# Patient Record
Sex: Female | Born: 1995
Health system: Southern US, Community
[De-identification: ages and names within clinical notes are randomized; demographics above are authoritative.]

## PROBLEM LIST (undated history)

## (undated) DIAGNOSIS — D219 Benign neoplasm of connective and other soft tissue, unspecified: Secondary | ICD-10-CM

## (undated) DIAGNOSIS — D649 Anemia, unspecified: Secondary | ICD-10-CM

## (undated) DIAGNOSIS — R519 Headache, unspecified: Secondary | ICD-10-CM

## (undated) DIAGNOSIS — Z789 Other specified health status: Secondary | ICD-10-CM

## (undated) DIAGNOSIS — Z22330 Carrier of Group B streptococcus: Secondary | ICD-10-CM

## (undated) DIAGNOSIS — D242 Benign neoplasm of left breast: Secondary | ICD-10-CM

## (undated) DIAGNOSIS — Z8619 Personal history of other infectious and parasitic diseases: Secondary | ICD-10-CM

## (undated) DIAGNOSIS — E079 Disorder of thyroid, unspecified: Secondary | ICD-10-CM

## (undated) DIAGNOSIS — R51 Headache: Secondary | ICD-10-CM

## (undated) HISTORY — DX: Benign neoplasm of connective and other soft tissue, unspecified: D21.9

## (undated) HISTORY — DX: Benign neoplasm of left breast: D24.2

## (undated) HISTORY — DX: Headache: R51

## (undated) HISTORY — DX: Personal history of other infectious and parasitic diseases: Z86.19

## (undated) HISTORY — PX: NO PAST SURGERIES: SHX2092

## (undated) HISTORY — DX: Headache, unspecified: R51.9

## (undated) HISTORY — DX: Anemia, unspecified: D64.9

---

## 2014-02-02 ENCOUNTER — Encounter (HOSPITAL_COMMUNITY): Payer: Self-pay | Admitting: Emergency Medicine

## 2014-02-02 ENCOUNTER — Emergency Department (HOSPITAL_COMMUNITY)
Admission: EM | Admit: 2014-02-02 | Discharge: 2014-02-02 | Disposition: A | Discharge status: Home / Self Care | Payer: No Typology Code available for payment source | Attending: Emergency Medicine | Admitting: Emergency Medicine

## 2014-02-02 ENCOUNTER — Emergency Department (HOSPITAL_COMMUNITY): Payer: No Typology Code available for payment source

## 2014-02-02 DIAGNOSIS — S8002XA Contusion of left knee, initial encounter: Secondary | ICD-10-CM

## 2014-02-02 DIAGNOSIS — Y9389 Activity, other specified: Secondary | ICD-10-CM | POA: Insufficient documentation

## 2014-02-02 DIAGNOSIS — S8000XA Contusion of unspecified knee, initial encounter: Secondary | ICD-10-CM | POA: Insufficient documentation

## 2014-02-02 DIAGNOSIS — Y92410 Unspecified street and highway as the place of occurrence of the external cause: Secondary | ICD-10-CM | POA: Insufficient documentation

## 2014-02-02 MED ORDER — IBUPROFEN 400 MG PO TABS
400.0000 mg | ORAL_TABLET | Freq: Once | ORAL | Status: AC
  Administered 2014-02-02: 400 mg via ORAL
  Filled 2014-02-02: qty 1
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
## 2014-02-02 NOTE — Discharge Instructions (Signed)
Rest, Ice intermittently (in the first 24-48 hours), Gentle compression with an Ace wrap, and elevate (Limb above the level of the heart)   Take up to 800mg  of ibuprofen (that is usually 4 over the counter pills)  3 times a day for 5 days. Take with food.  Do not hesitate to return to the Emergency Department for any new, worsening or concerning symptoms.   If you do not have a primary care doctor you can establish one at the   [facility]: [place] [id number] [phone]  After you establish care. Let them know you were seen in the emergency room. They must obtain records for further management.    Contusion A contusion is a deep bruise. Contusions are the result of an injury that caused bleeding under the skin. The contusion may turn blue, purple, or yellow. Minor injuries will give you a painless contusion, but more severe contusions may stay painful and swollen for a few weeks.  CAUSES  A contusion is usually caused by a blow, trauma, or direct force to an area of the body. SYMPTOMS   Swelling and redness of the injured area.  Bruising of the injured area.  Tenderness and soreness of the injured area.  Pain. DIAGNOSIS  The diagnosis can be made by taking a history and physical exam. An X-ray, CT scan, or MRI may be needed to determine if there were any associated injuries, such as fractures. TREATMENT  Specific treatment will depend on what area of the body was injured. In general, the best treatment for a contusion is resting, icing, elevating, and applying cold compresses to the injured area. Over-the-counter medicines may also be recommended for pain control. Ask your caregiver what the best treatment is for your contusion. HOME CARE INSTRUCTIONS   Put ice on the injured area.  Put ice in a plastic bag.  Place a towel between your skin and the bag.  Leave the ice on for 15-20 minutes, 3-4 times a day, or as directed by your health care  provider.  Only take over-the-counter or prescription medicines for pain, discomfort, or fever as directed by your caregiver. Your caregiver may recommend avoiding anti-inflammatory medicines (aspirin, ibuprofen, and naproxen) for 48 hours because these medicines may increase bruising.  Rest the injured area.  If possible, elevate the injured area to reduce swelling. SEEK IMMEDIATE MEDICAL CARE IF:   You have increased bruising or swelling.  You have pain that is getting worse.  Your swelling or pain is not relieved with medicines. MAKE SURE YOU:   Understand these instructions.  Will watch your condition.  Will get help right away if you are not doing well or get worse. Document Released: 05/02/2005 Document Revised: 07/28/2013 Document Reviewed: 05/28/2011 [facility] Patient Information 2015 [place], [place]. This information is not intended to replace advice given to you by your health care provider. Make sure you discuss any questions you have with your health care provider.

## 2014-02-04 NOTE — ED Provider Notes (Signed)
Medical screening examination/treatment/procedure(s) were performed by non-physician practitioner and as supervising physician I was immediately available for consultation/collaboration.   EKG Interpretation None         Tanna Furry, MD 02/04/14 1510

## 2014-02-13 ENCOUNTER — Emergency Department (HOSPITAL_COMMUNITY)
Admission: EM | Admit: 2014-02-13 | Discharge: 2014-02-13 | Disposition: A | Discharge status: Home / Self Care | Payer: No Typology Code available for payment source | Attending: Emergency Medicine | Admitting: Emergency Medicine

## 2014-02-13 ENCOUNTER — Encounter (HOSPITAL_COMMUNITY): Payer: Self-pay | Admitting: Emergency Medicine

## 2014-02-13 DIAGNOSIS — N938 Other specified abnormal uterine and vaginal bleeding: Secondary | ICD-10-CM | POA: Insufficient documentation

## 2014-02-13 DIAGNOSIS — Z791 Long term (current) use of non-steroidal anti-inflammatories (NSAID): Secondary | ICD-10-CM | POA: Insufficient documentation

## 2014-02-13 DIAGNOSIS — N949 Unspecified condition associated with female genital organs and menstrual cycle: Secondary | ICD-10-CM | POA: Insufficient documentation

## 2014-02-13 DIAGNOSIS — N939 Abnormal uterine and vaginal bleeding, unspecified: Secondary | ICD-10-CM

## 2014-02-13 DIAGNOSIS — Z3202 Encounter for pregnancy test, result negative: Secondary | ICD-10-CM | POA: Insufficient documentation

## 2014-02-13 LAB — WET PREP, GENITAL
CLUE CELLS WET PREP: NONE SEEN
TRICH WET PREP: NONE SEEN
Yeast Wet Prep HPF POC: NONE SEEN

## 2014-02-13 LAB — I-STAT CHEM 8, ED
BUN: 17 mg/dL (ref 6–23)
CALCIUM ION: 1.24 mmol/L — AB (ref 1.12–1.23)
Chloride: 105 mEq/L (ref 96–112)
Creatinine, Ser: 1 mg/dL (ref 0.50–1.10)
GLUCOSE: 93 mg/dL (ref 70–99)
HEMATOCRIT: 41 % (ref 36.0–46.0)
Hemoglobin: 13.9 g/dL (ref 12.0–15.0)
Potassium: 4.2 mEq/L (ref 3.7–5.3)
Sodium: 140 mEq/L (ref 137–147)
TCO2: 24 mmol/L (ref 0–100)

## 2014-02-13 LAB — POC URINE PREG, ED: Preg Test, Ur: NEGATIVE

## 2014-02-13 NOTE — Discharge Instructions (Signed)
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
## 2014-02-13 NOTE — ED Notes (Signed)
Pt is having vaginal bleeding and it is not her menstrual cycle because her last menstrual was a weak ago.  Pt states that she had some abdominal pain and lower back pain and some dizziness.

## 2014-02-14 NOTE — ED Provider Notes (Signed)
Medical screening examination/treatment/procedure(s) were performed by non-physician practitioner and as supervising physician I was immediately available for consultation/collaboration.   EKG Interpretation None       Veryl Speak, MD 02/14/14 1105

## 2014-02-15 LAB — GC/CHLAMYDIA PROBE AMP
CT PROBE, AMP APTIMA: NEGATIVE
GC Probe RNA: NEGATIVE

## 2014-10-25 ENCOUNTER — Emergency Department (HOSPITAL_COMMUNITY)
Admission: EM | Admit: 2014-10-25 | Discharge: 2014-10-25 | Disposition: A | Discharge status: Home / Self Care | Payer: Self-pay | Attending: Emergency Medicine | Admitting: Emergency Medicine

## 2014-10-25 ENCOUNTER — Encounter (HOSPITAL_COMMUNITY): Payer: Self-pay | Admitting: *Deleted

## 2014-10-25 DIAGNOSIS — Z8619 Personal history of other infectious and parasitic diseases: Secondary | ICD-10-CM | POA: Insufficient documentation

## 2014-10-25 DIAGNOSIS — Z3202 Encounter for pregnancy test, result negative: Secondary | ICD-10-CM | POA: Insufficient documentation

## 2014-10-25 DIAGNOSIS — Z791 Long term (current) use of non-steroidal anti-inflammatories (NSAID): Secondary | ICD-10-CM | POA: Insufficient documentation

## 2014-10-25 DIAGNOSIS — Z87891 Personal history of nicotine dependence: Secondary | ICD-10-CM | POA: Insufficient documentation

## 2014-10-25 DIAGNOSIS — R112 Nausea with vomiting, unspecified: Secondary | ICD-10-CM | POA: Insufficient documentation

## 2014-10-25 HISTORY — DX: Carrier of group B Streptococcus: Z22.330

## 2014-10-25 LAB — COMPREHENSIVE METABOLIC PANEL
ALBUMIN: 4.2 g/dL (ref 3.5–5.2)
ALK PHOS: 52 U/L (ref 39–117)
ALT: 27 U/L (ref 0–35)
AST: 22 U/L (ref 0–37)
Anion gap: 9 (ref 5–15)
BILIRUBIN TOTAL: 0.7 mg/dL (ref 0.3–1.2)
BUN: 13 mg/dL (ref 6–23)
CALCIUM: 9.4 mg/dL (ref 8.4–10.5)
CO2: 24 mmol/L (ref 19–32)
CREATININE: 0.83 mg/dL (ref 0.50–1.10)
Chloride: 106 mmol/L (ref 96–112)
GFR calc Af Amer: >90 mL/min (ref >90)
GFR calc non Af Amer: >90 mL/min (ref >90)
GLUCOSE: 109 mg/dL — AB (ref 70–99)
Potassium: 4.2 mmol/L (ref 3.5–5.1)
Sodium: 139 mmol/L (ref 135–145)
TOTAL PROTEIN: 6.7 g/dL (ref 6.0–8.3)

## 2014-10-25 LAB — URINE MICROSCOPIC-ADD ON

## 2014-10-25 LAB — CBC WITH DIFFERENTIAL/PLATELET
Basophils Absolute: 0 10*3/uL (ref 0.0–0.1)
Basophils Relative: 0 % (ref 0–1)
EOS ABS: 0 10*3/uL (ref 0.0–0.7)
Eosinophils Relative: 0 % (ref 0–5)
HCT: 41.2 % (ref 36.0–46.0)
HEMOGLOBIN: 13.7 g/dL (ref 12.0–15.0)
LYMPHS ABS: 0.9 10*3/uL (ref 0.7–4.0)
Lymphocytes Relative: 10 % — ABNORMAL LOW (ref 12–46)
MCH: 29.3 pg (ref 26.0–34.0)
MCHC: 33.3 g/dL (ref 30.0–36.0)
MCV: 88.2 fL (ref 78.0–100.0)
MONO ABS: 0.6 10*3/uL (ref 0.1–1.0)
MONOS PCT: 6 % (ref 3–12)
Neutro Abs: 8.1 10*3/uL — ABNORMAL HIGH (ref 1.7–7.7)
Neutrophils Relative %: 84 % — ABNORMAL HIGH (ref 43–77)
Platelets: 224 10*3/uL (ref 150–400)
RBC: 4.67 MIL/uL (ref 3.87–5.11)
RDW: 13.2 % (ref 11.5–15.5)
WBC: 9.7 10*3/uL (ref 4.0–10.5)

## 2014-10-25 LAB — URINALYSIS, ROUTINE W REFLEX MICROSCOPIC
BILIRUBIN URINE: NEGATIVE
GLUCOSE, UA: NEGATIVE mg/dL
HGB URINE DIPSTICK: NEGATIVE
KETONES UR: NEGATIVE mg/dL
Nitrite: NEGATIVE
PH: 7 (ref 5.0–8.0)
PROTEIN: NEGATIVE mg/dL
Specific Gravity, Urine: 1.024 (ref 1.005–1.030)
Urobilinogen, UA: 0.2 mg/dL (ref 0.0–1.0)

## 2014-10-25 LAB — POC URINE PREG, ED: PREG TEST UR: NEGATIVE

## 2014-10-25 MED ORDER — ONDANSETRON 4 MG PO TBDP
8.0000 mg | ORAL_TABLET | Freq: Once | ORAL | Status: AC
  Administered 2014-10-25: 8 mg via ORAL
  Filled 2014-10-25: qty 2

## 2014-10-25 MED ORDER — KETOROLAC TROMETHAMINE 30 MG/ML IJ SOLN
30.0000 mg | Freq: Once | INTRAMUSCULAR | Status: AC
  Administered 2014-10-25: 30 mg via INTRAVENOUS
  Filled 2014-10-25: qty 1

## 2014-10-25 MED ORDER — SODIUM CHLORIDE 0.9 % IV BOLUS (SEPSIS)
1000.0000 mL | Freq: Once | INTRAVENOUS | Status: AC
  Administered 2014-10-25: 1000 mL via INTRAVENOUS

## 2014-10-25 MED ORDER — DICYCLOMINE HCL 10 MG/ML IM SOLN
20.0000 mg | Freq: Once | INTRAMUSCULAR | Status: DC
  Filled 2014-10-25: qty 2

## 2014-10-25 NOTE — ED Notes (Signed)
Pt. Started vomiting last night around 10 pm. Pt is unsure of how much she has vomited. Pt. States vomit is now green

## 2014-10-25 NOTE — Discharge Instructions (Signed)
Nausea and Vomiting Nausea means you feel sick to your stomach. Throwing up (vomiting) is a reflex where stomach contents come out of your mouth. HOME CARE   Take medicine as told by your doctor.  Do not force yourself to eat. However, you do need to drink fluids.  If you feel like eating, eat a normal diet as told by your doctor.  Eat rice, wheat, potatoes, bread, lean meats, yogurt, fruits, and vegetables.  Avoid high-fat foods.  Drink enough fluids to keep your pee (urine) clear or pale yellow.  Ask your doctor how to replace body fluid losses (rehydrate). Signs of body fluid loss (dehydration) include:  Feeling very thirsty.  Dry lips and mouth.  Feeling dizzy.  Dark pee.  Peeing less than normal.  Feeling confused.  Fast breathing or heart rate. GET HELP RIGHT AWAY IF:   You have blood in your throw up.  You have black or bloody poop (stool).  You have a bad headache or stiff neck.  You feel confused.  You have bad belly (abdominal) pain.  You have chest pain or trouble breathing.  You do not pee at least once every 8 hours.  You have cold, clammy skin.  You keep throwing up after 24 to 48 hours.  You have a fever. MAKE SURE YOU:   Understand these instructions.  Will watch your condition.  Will get help right away if you are not doing well or get worse. Document Released: 01/09/2008 Document Revised: 10/15/2011 Document Reviewed: 12/22/2010 [facility] Patient Information 2015 [place], [place]. This information is not intended to replace advice given to you by your health care provider. Make sure you discuss any questions you have with your health care provider.  Food Choices to Help Relieve Diarrhea When you have diarrhea, the foods you eat and your eating habits are very important. Choosing the right foods and drinks can help relieve diarrhea. Also, because diarrhea can last up to 7 days, you need to replace lost fluids and electrolytes (such as  sodium, potassium, and chloride) in order to help prevent dehydration.  WHAT GENERAL GUIDELINES DO I NEED TO FOLLOW?  Slowly drink 1 cup (8 oz) of fluid for each episode of diarrhea. If you are getting enough fluid, your urine will be clear or pale yellow.  Eat starchy foods. Some good choices include white rice, white toast, pasta, low-fiber cereal, baked potatoes (without the skin), saltine crackers, and bagels.  Avoid large servings of any cooked vegetables.  Limit fruit to two servings per day. A serving is  cup or 1 small piece.  Choose foods with less than 2 g of fiber per serving.  Limit fats to less than 8 tsp (38 g) per day.  Avoid fried foods.  Eat foods that have probiotics in them. Probiotics can be found in certain dairy products.  Avoid foods and beverages that may increase the speed at which food moves through the stomach and intestines (gastrointestinal tract). Things to avoid include:  High-fiber foods, such as dried fruit, raw fruits and vegetables, nuts, seeds, and whole grain foods.  Spicy foods and high-fat foods.  Foods and beverages sweetened with high-fructose corn syrup, honey, or sugar alcohols such as xylitol, sorbitol, and mannitol. WHAT FOODS ARE RECOMMENDED? Grains White rice. White, [place], or pita breads (fresh or toasted), including plain rolls, buns, or bagels. White pasta. Saltine, soda, or graham crackers. Pretzels. Low-fiber cereal. Cooked cereals made with water (such as cornmeal, farina, or cream cereals). Plain muffins. Matzo.  Melba toast. Zwieback.  Vegetables Potatoes (without the skin). Strained tomato and vegetable juices. Most well-cooked and canned vegetables without seeds. Tender lettuce. Fruits Cooked or canned applesauce, apricots, cherries, fruit cocktail, grapefruit, peaches, pears, or plums. Fresh bananas, apples without skin, cherries, grapes, cantaloupe, grapefruit, peaches, oranges, or plums.  Meat and Other Protein  Products Baked or boiled chicken. Eggs. Tofu. Fish. Seafood. Smooth peanut butter. Ground or well-cooked tender beef, ham, veal, lamb, pork, or poultry.  Dairy Plain yogurt, kefir, and unsweetened liquid yogurt. Lactose-free milk, buttermilk, or soy milk. Plain hard cheese. Beverages Sport drinks. Clear broths. Diluted fruit juices (except prune). Regular, caffeine-free sodas such as ginger ale. Water. Decaffeinated teas. Oral rehydration solutions. Sugar-free beverages not sweetened with sugar alcohols. Other Bouillon, broth, or soups made from recommended foods.  The items listed above may not be a complete list of recommended foods or beverages. Contact your dietitian for more options. WHAT FOODS ARE NOT RECOMMENDED? Grains Whole grain, whole wheat, bran, or rye breads, rolls, pastas, crackers, and cereals. Wild or brown rice. Cereals that contain more than 2 g of fiber per serving. Corn tortillas or taco shells. Cooked or dry oatmeal. Granola. Popcorn. Vegetables Raw vegetables. Cabbage, broccoli, Brussels sprouts, artichokes, baked beans, beet greens, corn, kale, legumes, peas, sweet potatoes, and yams. Potato skins. Cooked spinach and cabbage. Fruits Dried fruit, including raisins and dates. Raw fruits. Stewed or dried prunes. Fresh apples with skin, apricots, mangoes, pears, raspberries, and strawberries.  Meat and Other Protein Products Chunky peanut butter. Nuts and seeds. Beans and lentils. Berniece Salines.  Dairy High-fat cheeses. Milk, chocolate milk, and beverages made with milk, such as milk shakes. Cream. Ice cream. Sweets and Desserts Sweet rolls, doughnuts, and sweet breads. Pancakes and waffles. Fats and Oils Butter. Cream sauces. Margarine. Salad oils. Plain salad dressings. Olives. Avocados.  Beverages Caffeinated beverages (such as coffee, tea, soda, or energy drinks). Alcoholic beverages. Fruit juices with pulp. Prune juice. Soft drinks sweetened with high-fructose corn syrup or  sugar alcohols. Other Coconut. Hot sauce. Chili powder. Mayonnaise. Gravy. Cream-based or milk-based soups.  The items listed above may not be a complete list of foods and beverages to avoid. Contact your dietitian for more information. WHAT SHOULD I DO IF I BECOME DEHYDRATED? Diarrhea can sometimes lead to dehydration. Signs of dehydration include dark urine and dry mouth and skin. If you think you are dehydrated, you should rehydrate with an oral rehydration solution. These solutions can be purchased at pharmacies, retail stores, or online.  Drink -1 cup (120-240 mL) of oral rehydration solution each time you have an episode of diarrhea. If drinking this amount makes your diarrhea worse, try drinking smaller amounts more often. For example, drink 1-3 tsp (5-15 mL) every 5-10 minutes.  A general rule for staying hydrated is to drink 1-2 L of fluid per day. Talk to your health care provider about the specific amount you should be drinking each day. Drink enough fluids to keep your urine clear or pale yellow. Document Released: 10/13/2003 Document Revised: 07/28/2013 Document Reviewed: 06/15/2013 [facility] Patient Information 2015 [place], [place]. This information is not intended to replace advice given to you by your health care provider. Make sure you discuss any questions you have with your health care provider.

## 2014-10-25 NOTE — ED Provider Notes (Signed)
CSN: [id number]     Arrival date & time 10/25/14  6948 History   First MD Initiated Contact with Patient 10/25/14 0421     Chief Complaint  Patient presents with  . Emesis     (Consider location/radiation/quality/duration/timing/severity/associated sxs/prior Treatment) Patient is a 19 y.o. female presenting with vomiting. The history is provided by the patient.  Emesis Severity:  Moderate Timing:  Intermittent Quality:  Stomach contents Progression:  Unchanged Chronicity:  New Recent urination:  Normal Context: not post-tussive   Relieved by:  Nothing Worsened by:  Nothing tried Ineffective treatments:  None tried Associated symptoms: no diarrhea   Risk factors: sick contacts   Risk factors: no alcohol use     Past Medical History  Diagnosis Date  . Streptococcus B carrier or suspected carrier    History reviewed. No pertinent past surgical history. History reviewed. No pertinent family history. History  Substance Use Topics  . Smoking status: Former Smoker    Quit date: 10/25/2010  . Smokeless tobacco: Never Used  . Alcohol Use: Yes     Comment: socially   OB History    No data available     Review of Systems  Gastrointestinal: Positive for vomiting. Negative for diarrhea.  All other systems reviewed and are negative.     Allergies  Review of patient's allergies indicates no known allergies.  Home Medications   Prior to Admission medications   Medication Sig Start Date End Date Taking? Authorizing Provider  Naproxen Sodium (ALEVE PO) Take 2 tablets by mouth daily as needed (headache).   Yes Historical Provider, MD   BP 122/62 mmHg  Pulse 93  Temp(Src) 97.8 F (36.6 C) (Oral)  Resp 20  Ht 5\' 8"  (1.727 m)  Wt 245 lb (111.131 kg)  BMI 37.26 kg/m2  SpO2 98%  LMP 09/26/2014 Physical Exam  Constitutional: She is oriented to person, place, and time. She appears well-developed and well-nourished. No distress.  HENT:  Head: Normocephalic and  atraumatic.  Mouth/Throat: Oropharynx is clear and moist.  Eyes: Conjunctivae are normal. Pupils are equal, round, and reactive to light.  Neck: Normal range of motion. Neck supple.  Cardiovascular: Normal rate and regular rhythm.   Pulmonary/Chest: Effort normal and breath sounds normal. No respiratory distress. She has no wheezes. She has no rales.  Abdominal: Soft. Bowel sounds are normal. There is no tenderness. There is no rebound and no guarding.  Musculoskeletal: Normal range of motion.  Neurological: She is alert and oriented to person, place, and time.  Skin: Skin is warm and dry.  Psychiatric: She has a normal mood and affect.    ED Course  Procedures (including critical care time) Labs Review Labs Reviewed  CBC WITH DIFFERENTIAL/PLATELET - Abnormal; Notable for the following:    Neutrophils Relative % 84 (*)    Neutro Abs 8.1 (*)    Lymphocytes Relative 10 (*)    All other components within normal limits  COMPREHENSIVE METABOLIC PANEL - Abnormal; Notable for the following:    Glucose, Bld 109 (*)    All other components within normal limits  URINALYSIS, ROUTINE W REFLEX MICROSCOPIC - Abnormal; Notable for the following:    Leukocytes, UA SMALL (*)    All other components within normal limits  URINE MICROSCOPIC-ADD ON - Abnormal; Notable for the following:    Squamous Epithelial / LPF FEW (*)    Bacteria, UA FEW (*)    All other components within normal limits  POC URINE PREG, ED  Imaging Review No results found.   EKG Interpretation None      MDM   Final diagnoses:  None  well appearing  Symptoms consistent with viral gastroenteritis and is tolerating PO will d/c with strict return precautions.     Veatrice Kells, MD 10/25/14 [phone]

## 2015-04-15 ENCOUNTER — Encounter: Payer: Medicaid Other | Admitting: Obstetrics & Gynecology

## 2015-08-07 NOTE — L&D Delivery Note (Signed)
Vaginal Delivery Note The pt utilized an epidural as pain management.   Artificial rupture of membranes yesterday at 2015, clear.  GBS neagtive.  Cervical dilation was complete at  1.  NICHD Category 2.    Pushing with guidance began at  1220.   After 16 minutes of pushing the head, shoulders and the body of a viable female infant "Jordyn" delivered spontaneously with maternal effort in the ROT position at 1236    With vigorous tone and spontaneous cry, the infant was placed on moms abd.  After the umbilical cord was clamped it was cut by the FOB, then cord blood was obtained for evaluation.  Spontaneous delivery of a intact placenta with a 3 vessel cord via [place] at  1313.   Episiotomy: None   The vulva, perineum, vaginal vault, rectum and cervix were inspected and revealed a superficial right labial, an superficial inner left labial bilateral and a left peri ureteral laceration, all repaired using a 4-0 vicryl on a SH  Lidocaine was not used, the epidural was sufficient for the repair.   Patient tolerated repair well.   Postpartum pitocin as ordered.  Fundus firm, lochia minimum, bleeding under control.  EBL 250, Pt hemodynamically stable.   Sponge, laps and needle count correct and verified with the primary care nurse.  Attending MD available at all times.    Routine postpartum orders   Mother desires IUD  for contraception  Mom plans to breastfeed   Infant to have out patient circumcision   Placenta to pathology: NO     Cord Gases sent to lab: NO Cord blood sent to lab: YES   APGARS:  9 at 1 minute and 9 at 5 minutes Weight:. pending     Both mom and baby were left in stable condition, baby skin to skin.      Liani Caris, CNM, MSN 11/02/2015. 4:44 PM

## 2015-08-18 ENCOUNTER — Other Ambulatory Visit (HOSPITAL_COMMUNITY): Payer: Self-pay | Admitting: Certified Nurse Midwife

## 2015-08-18 DIAGNOSIS — E669 Obesity, unspecified: Secondary | ICD-10-CM

## 2015-08-18 DIAGNOSIS — IMO0002 Reserved for concepts with insufficient information to code with codable children: Secondary | ICD-10-CM

## 2015-08-18 DIAGNOSIS — Z0489 Encounter for examination and observation for other specified reasons: Secondary | ICD-10-CM

## 2015-08-26 ENCOUNTER — Encounter (HOSPITAL_COMMUNITY): Payer: Self-pay

## 2015-08-26 ENCOUNTER — Ambulatory Visit (HOSPITAL_COMMUNITY)
Admission: RE | Admit: 2015-08-26 | Discharge: 2015-08-26 | Disposition: A | Discharge status: Home / Self Care | Payer: Medicaid Other | Source: Ambulatory Visit | Attending: Certified Nurse Midwife | Admitting: Certified Nurse Midwife

## 2015-08-26 DIAGNOSIS — IMO0002 Reserved for concepts with insufficient information to code with codable children: Secondary | ICD-10-CM

## 2015-08-26 DIAGNOSIS — O99213 Obesity complicating pregnancy, third trimester: Secondary | ICD-10-CM | POA: Insufficient documentation

## 2015-08-26 DIAGNOSIS — Z3A31 31 weeks gestation of pregnancy: Secondary | ICD-10-CM | POA: Insufficient documentation

## 2015-08-26 DIAGNOSIS — Z0489 Encounter for examination and observation for other specified reasons: Secondary | ICD-10-CM

## 2015-08-26 DIAGNOSIS — E669 Obesity, unspecified: Secondary | ICD-10-CM

## 2015-08-26 HISTORY — DX: Other specified health status: Z78.9

## 2015-11-01 ENCOUNTER — Encounter (HOSPITAL_COMMUNITY): Payer: Self-pay | Admitting: *Deleted

## 2015-11-01 ENCOUNTER — Inpatient Hospital Stay (HOSPITAL_COMMUNITY)
Admission: AD | Admit: 2015-11-01 | Discharge: 2015-11-04 | DRG: 775 | Disposition: A | Discharge status: Home / Self Care | Payer: Medicaid Other | Source: Ambulatory Visit | Attending: Obstetrics and Gynecology | Admitting: Obstetrics and Gynecology

## 2015-11-01 DIAGNOSIS — Z3A40 40 weeks gestation of pregnancy: Secondary | ICD-10-CM

## 2015-11-01 DIAGNOSIS — E669 Obesity, unspecified: Secondary | ICD-10-CM | POA: Diagnosis present

## 2015-11-01 DIAGNOSIS — IMO0002 Reserved for concepts with insufficient information to code with codable children: Secondary | ICD-10-CM | POA: Diagnosis present

## 2015-11-01 DIAGNOSIS — Z87891 Personal history of nicotine dependence: Secondary | ICD-10-CM | POA: Diagnosis not present

## 2015-11-01 DIAGNOSIS — O9081 Anemia of the puerperium: Secondary | ICD-10-CM | POA: Diagnosis not present

## 2015-11-01 DIAGNOSIS — O99214 Obesity complicating childbirth: Secondary | ICD-10-CM | POA: Diagnosis present

## 2015-11-01 DIAGNOSIS — D62 Acute posthemorrhagic anemia: Secondary | ICD-10-CM | POA: Diagnosis not present

## 2015-11-01 DIAGNOSIS — Z6835 Body mass index (BMI) 35.0-35.9, adult: Secondary | ICD-10-CM

## 2015-11-01 DIAGNOSIS — Z833 Family history of diabetes mellitus: Secondary | ICD-10-CM | POA: Diagnosis not present

## 2015-11-01 DIAGNOSIS — O480 Post-term pregnancy: Secondary | ICD-10-CM

## 2015-11-01 DIAGNOSIS — Z8249 Family history of ischemic heart disease and other diseases of the circulatory system: Secondary | ICD-10-CM | POA: Diagnosis not present

## 2015-11-01 LAB — TYPE AND SCREEN
ABO/RH(D): O POS
ANTIBODY SCREEN: NEGATIVE

## 2015-11-01 LAB — OB RESULTS CONSOLE GBS
GBS: NEGATIVE
STREP GROUP B AG: NEGATIVE

## 2015-11-01 LAB — ABO/RH: ABO/RH(D): O POS

## 2015-11-01 LAB — CBC
HEMATOCRIT: 35.4 % — AB (ref 36.0–46.0)
Hemoglobin: 12.1 g/dL (ref 12.0–15.0)
MCH: 29.7 pg (ref 26.0–34.0)
MCHC: 34.2 g/dL (ref 30.0–36.0)
MCV: 86.8 fL (ref 78.0–100.0)
PLATELETS: 220 10*3/uL (ref 150–400)
RBC: 4.08 MIL/uL (ref 3.87–5.11)
RDW: 14.4 % (ref 11.5–15.5)
WBC: 9.5 10*3/uL (ref 4.0–10.5)

## 2015-11-01 LAB — OB RESULTS CONSOLE HEPATITIS B SURFACE ANTIGEN: Hepatitis B Surface Ag: NEGATIVE

## 2015-11-01 LAB — OB RESULTS CONSOLE GC/CHLAMYDIA
Chlamydia: NEGATIVE
Gonorrhea: NEGATIVE

## 2015-11-01 LAB — COMPREHENSIVE METABOLIC PANEL
ALT: 14 U/L (ref 14–54)
AST: 19 U/L (ref 15–41)
Albumin: 3.3 g/dL — ABNORMAL LOW (ref 3.5–5.0)
Alkaline Phosphatase: 139 U/L — ABNORMAL HIGH (ref 38–126)
Anion gap: 9 (ref 5–15)
BILIRUBIN TOTAL: 0.4 mg/dL (ref 0.3–1.2)
BUN: 11 mg/dL (ref 6–20)
CO2: 20 mmol/L — ABNORMAL LOW (ref 22–32)
CREATININE: 0.56 mg/dL (ref 0.44–1.00)
Calcium: 9 mg/dL (ref 8.9–10.3)
Chloride: 108 mmol/L (ref 101–111)
Glucose, Bld: 75 mg/dL (ref 65–99)
POTASSIUM: 4.2 mmol/L (ref 3.5–5.1)
Sodium: 137 mmol/L (ref 135–145)
TOTAL PROTEIN: 6.4 g/dL — AB (ref 6.5–8.1)

## 2015-11-01 LAB — PROTEIN / CREATININE RATIO, URINE
Creatinine, Urine: 182 mg/dL
Protein Creatinine Ratio: 0.06 mg/mg{Cre} (ref 0.00–0.15)
TOTAL PROTEIN, URINE: 11 mg/dL

## 2015-11-01 LAB — OB RESULTS CONSOLE ANTIBODY SCREEN: Antibody Screen: NEGATIVE

## 2015-11-01 LAB — URIC ACID: URIC ACID, SERUM: 4.3 mg/dL (ref 2.3–6.6)

## 2015-11-01 LAB — LACTATE DEHYDROGENASE: LDH: 145 U/L (ref 98–192)

## 2015-11-01 LAB — OB RESULTS CONSOLE RUBELLA ANTIBODY, IGM: RUBELLA: IMMUNE

## 2015-11-01 LAB — OB RESULTS CONSOLE RPR: RPR: NONREACTIVE

## 2015-11-01 LAB — OB RESULTS CONSOLE ABO/RH: RH TYPE: POSITIVE

## 2015-11-01 LAB — OB RESULTS CONSOLE HIV ANTIBODY (ROUTINE TESTING): HIV: NONREACTIVE

## 2015-11-01 MED ORDER — MISOPROSTOL 25 MCG QUARTER TABLET
25.0000 ug | ORAL_TABLET | ORAL | Status: DC | PRN
  Filled 2015-11-01: qty 1

## 2015-11-01 MED ORDER — OXYCODONE-ACETAMINOPHEN 5-325 MG PO TABS: 2.0000 | ORAL_TABLET | ORAL | Status: DC | PRN

## 2015-11-01 MED ORDER — LIDOCAINE HCL (PF) 1 % IJ SOLN
30.0000 mL | INTRAMUSCULAR | Status: DC | PRN
  Filled 2015-11-01: qty 30

## 2015-11-01 MED ORDER — OXYTOCIN BOLUS FROM INFUSION
500.0000 mL | INTRAVENOUS | Status: DC
Start: 2015-11-01 — End: 2015-11-02

## 2015-11-01 MED ORDER — ACETAMINOPHEN 325 MG PO TABS
650.0000 mg | ORAL_TABLET | ORAL | Status: DC | PRN
  Administered 2015-11-02: 650 mg via ORAL
  Filled 2015-11-01: qty 2

## 2015-11-01 MED ORDER — FENTANYL CITRATE (PF) 100 MCG/2ML IJ SOLN
100.0000 ug | INTRAMUSCULAR | Status: DC | PRN
  Administered 2015-11-02: 100 ug via INTRAVENOUS
  Filled 2015-11-01: qty 2

## 2015-11-01 MED ORDER — TERBUTALINE SULFATE 1 MG/ML IJ SOLN
0.2500 mg | Freq: Once | INTRAMUSCULAR | Status: DC | PRN
  Filled 2015-11-01: qty 1

## 2015-11-01 MED ORDER — OXYTOCIN 10 UNIT/ML IJ SOLN: 2.5000 [IU]/h | INTRAVENOUS | Status: DC

## 2015-11-01 MED ORDER — ONDANSETRON HCL 4 MG/2ML IJ SOLN
4.0000 mg | Freq: Four times a day (QID) | INTRAMUSCULAR | Status: DC | PRN
  Administered 2015-11-02 (×2): 4 mg via INTRAVENOUS
  Filled 2015-11-01 (×2): qty 2

## 2015-11-01 MED ORDER — LACTATED RINGERS IV SOLN
INTRAVENOUS | Status: DC
  Administered 2015-11-02: 125 mL/h via INTRAVENOUS

## 2015-11-01 MED ORDER — ZOLPIDEM TARTRATE 5 MG PO TABS: 5.0000 mg | ORAL_TABLET | Freq: Every evening | ORAL | Status: DC | PRN

## 2015-11-01 MED ORDER — OXYCODONE-ACETAMINOPHEN 5-325 MG PO TABS: 1.0000 | ORAL_TABLET | ORAL | Status: DC | PRN

## 2015-11-01 MED ORDER — CITRIC ACID-SODIUM CITRATE 334-500 MG/5ML PO SOLN: 30.0000 mL | ORAL | Status: DC | PRN

## 2015-11-01 MED ORDER — FLEET ENEMA 7-19 GM/118ML RE ENEM: 1.0000 | ENEMA | RECTAL | Status: DC | PRN

## 2015-11-01 MED ORDER — LACTATED RINGERS IV SOLN
500.0000 mL | INTRAVENOUS | Status: DC | PRN
  Administered 2015-11-02: 500 mL via INTRAVENOUS

## 2015-11-01 MED ORDER — OXYTOCIN 10 UNIT/ML IJ SOLN
1.0000 m[IU]/min | INTRAVENOUS | Status: DC
  Administered 2015-11-01: 2 m[IU]/min via INTRAVENOUS
  Filled 2015-11-01: qty 10

## 2015-11-01 NOTE — Progress Notes (Signed)
[name] [name] MRN: [id number]  Subjective: -Care assumed of 20y.o. G1P0 at 40.5wks who presents for IOL secondary to equivocal antenatal testing.  In room to assess and discuss POC.  Patient reports comfort and no perception of ctx.  Endorses fetal m/m and denies LOF, VB.  Desires unmedicated childbirth.   Objective: BP 120/57 mmHg  Pulse 106  Temp(Src) 98.6 F (37 C) (Oral)  Resp 18  Ht 5\' 7"  (1.702 m)  Wt 117.935 kg (260 lb)  BMI 40.71 kg/m2  LMP 01/20/2015      Fetal Monitoring: FHT: 140 bpm, Mod Var, -Decels, -Accels UC: None graphed    Vaginal Exam: SVE:   Dilation: 3 Effacement (%): 70 Station: -2 Exam by:: [name] [name] cnm Membranes:AROM with small amt clear fluid Internal Monitors: IUPC inserted without issues  Augmentation/Induction: Pitocin:Ordered at 2x2 Cytotec: None  Assessment:  IUP at 40.5wks Cat I FT IOL-Pitocin GBS Negative Amniotomy  Plan: -Dr. ND consulted and advised: +Attempt AROM, if successful-insertion of IUPC and FSE +Start Pitocin -Discussed IUPC r/b, prior to insertion, including increased risk of infection and ability to adequately monitor quantity and strength of contractions. -Discussed FSE r/b including increased risk of infection and injury to infant, ability to monitor FHR. -FSE insertion deferred d/t fetal position -Pitocin ordered at 2x2 -Patient encouraged to rest -Educated on available medications for pain; fentanyl and nitrous oxide -Continue other mgmt as ordered   [name] [name], CNM 11/01/2015, 8:18 PM  Addendum 2105:  S:Nurse call reports patient would like to postpone pitocin infusion at current and would like to ambulate  O:FHR: 150 bpm, Mod Var, -Decels, +Accels Ctx: Q3-33min, MVUs 75  A: Desires Unmedicated Birth Increased Ctx  P: Okay to withhold pitocin until contraction pattern decreases or becomes stagnant Unable to ambulate due to internal monitor  [name] Conners MSN, CNM 11/01/2015

## 2015-11-01 NOTE — Progress Notes (Addendum)
[name] [name] MRN: [id number]  Subjective: -Patient reports contractions that are mild and "annoying." Reports fear of pitocin augmentation d/t desire to be unmedicated.    Objective: BP 131/71 mmHg  Pulse 98  Temp(Src) 98.5 F (36.9 C) (Axillary)  Resp 18  Ht 5\' 7"  (1.702 m)  Wt 117.935 kg (260 lb)  BMI 40.71 kg/m2  LMP 01/20/2015      Fetal Monitoring: FHT: 135 bpm, Mod Var, -Decels, +Accels UC: Q3-20min    Vaginal Exam: SVE:   Dilation: 4 Effacement (%): 70 Station: Ballotable Exam by:: J.Nyari Olsson,CNM Membranes:AROM Internal Monitors: IUPC  Augmentation/Induction: Pitocin:Initiated Cytotec: None  Assessment:  IUP at 39.1wks Cat I FT  Pitocin IOL GBS Negative  Plan: -Discussed need for pitocin augmentation in context of induction of labor and AROM -Explained labor curve and decrease in contraction pattern -Discussed increased risk for infection with prolonged ROM -Educated on pitocin uses, receptors in body, and effects on labor -Patient verbalizes understanding and agrees to augmentation -Increase pitocin every hour as appropriate -Continue other mgmt as ordered  [name], CNM 11/01/2015, 11:36 PM

## 2015-11-01 NOTE — H&P (Signed)
[name] [name] is a 20 y.o. female, G1P0 at 19 5/7 weeks, presenting for induction due to equivocal antenatal testing in office today.  Had regular office visit, with NST for post-dates--NST non-reactive, with ? Late decel.  BPP 8/8, but ? decel visible during scan.  Cervix was 1 cm, 60%, vtx, -2 in office.  Patient denies HA, visual sx, epigastric pain, leaking, bleeding, has very occasional/mild UCs, and notes +FM.    Patient had positive GBS urine culture in 2015, but all testing during this pregnancy was negative.  Patient Active Problem List   Diagnosis Date Noted  . Normal labor 11/01/2015  . Obesity (BMI 35.0-39.9 without comorbidity) ([place]) 11/01/2015  . Abnormal fetal heart rate or rhythm (FHR) 11/01/2015  . Post term pregnancy over 40 weeks 11/01/2015    History of present pregnancy: Patient entered care at 13 weeks.   EDC of 10/27/15 was established by LMP  Anatomy scan:  22 1/7 weeks, with limited anatomy and a posterior placenta.  EFW 1 lb, 35%ile, normal fluid, cervix 3.52 Additional [place] evaluations:   26 1/7 weeks--still limited view of spine, normal fluid 29 1/7 weeks--EFW 2+12, 21%ile, cervix 4.40, normal fluid, breech, still limited view of spine 36 4/7 weeks--Vtx, posterior placenta, AFI 55%ile.    Significant prenatal events:  Initial weight 246.  Some frequency, UA negative.  Hx of +GBS on urine culture prior to prenancy--negative urine culture at [facility], and negative GBS culture in 3rd trimester.  Suboptimal view of spine on several attempts during pregnancy.  Some carpal tunnel.   Last evaluation:  Today--cervix 1 cm, 60%, vtx, -2.  BP 104/64, 260 lbs  OB History    Gravida Para Term Preterm AB TAB SAB Ectopic Multiple Living   1         0     Past Medical History  Diagnosis Date  . Streptococcus B carrier or suspected carrier   . Medical history non-contributory    Past Surgical History  Procedure Laterality Date  . No past surgeries     Family History: MGM  DM; Father HTN  Social History:  reports that she quit smoking about 5 years ago. She has never used smokeless tobacco. She reports that she drinks alcohol. She reports that she does not use illicit drugs.  She is Caucasian, 2 years of college, employed in Therapist, art.  FOB is involved and supportive.   Prenatal Transfer Tool  Maternal Diabetes: No Genetic Screening: Normal Panorama Maternal Ultrasounds/Referrals: Normal Fetal Ultrasounds or other Referrals:  None Maternal Substance Abuse:  No Significant Maternal Medications:  None Significant Maternal Lab Results: Lab values include: Group B Strep negative  TDAP 10/04/15 Flu declined  ROS:  +FM, occasional UCs  No Known Allergies     Blood pressure 130/69, pulse 97, temperature 98.6 F (37 C), temperature source Oral, resp. rate 18, height 5\' 7"  (1.702 m), weight 117.935 kg (260 lb), last menstrual period 01/20/2015.   Filed Vitals:   11/01/15 1424 11/01/15 1428 11/01/15 1455 11/01/15 1540  BP: 146/86  130/69 120/57  Pulse: 109  97 106  Temp:   98.6 F (37 C)   TempSrc:   Oral   Resp:   18   Height:  5\' 7"  (1.702 m)    Weight:  117.935 kg (260 lb)      Chest clear Heart RRR without murmur Abd gravid, NT, FH 39 cm Pelvic: Cervix very posterior, 1 cm, 50%, vtx, -2, soft. Ext: DTR 3+, no  clonus, trace edema  FHR: Category 1 initially, then increase of baseline to 160-170, moderate variability UCs:  Occasional, mild to palpation  Prenatal labs: ABO, Rh: O/Positive/-- (03/28 0000)O+ Antibody: Negative (03/28 0000)Neg Rubella:  Immune RPR: Nonreactive (03/28 0000) NR HBsAg: Negative (03/28 0000) Neg HIV: Non-reactive (03/28 0000) NR GBS: Positive, Negative (03/28 0000)Negative Sickle cell/Hgb electrophoresis:  NA Pap:  10/2014 GC:  Negative 04/21/15, 10/03/15 Chlamydia:  Negative 04/21/15, 10/03/15 Genetic screenings:  Panorama normal, female. Glucola:  WNL Other:   Hgb 12.5 at NOB, 11.9 at 28  weeks.    Assessment/Plan: IUP at 40 5/7 weeks Equivocal fetal testing today GBS negative  Plan: Admit to [facility] per consult with Dr. Alesia Richards Routine [facility] orders Pain med/epidural prn GBS testing negative throughout pregnancy--no need to treat. PIH labs, PCR Will continue to observe FHR at present prior to determining method of induction.   LATHAM, [place], CNM, [place] 11/01/2015, 4p  Addendum: FHR now Category 1. Occasional UCs Per discussion with Dr. Alesia Richards, will attempt foley bulb placement.  CBC Latest Ref Rng 11/01/2015 10/25/2014 02/13/2014  WBC 4.0 - 10.5 K/uL 9.5 9.7 -  Hemoglobin 12.0 - 15.0 g/dL 12.1 13.7 13.9  Hematocrit 36.0 - 46.0 % 35.4(L) 41.2 41.0  Platelets 150 - 400 K/uL 220 224 -   CMP Latest Ref Rng 11/01/2015 10/25/2014 02/13/2014  Glucose 65 - 99 mg/dL 75 109(H) 93  BUN 6 - 20 mg/dL 11 13 17   Creatinine 0.44 - 1.00 mg/dL 0.56 0.83 1.00  Sodium 135 - 145 mmol/L 137 139 140  Potassium 3.5 - 5.1 mmol/L 4.2 4.2 4.2  Chloride 101 - 111 mmol/L 108 106 105  CO2 22 - 32 mmol/L 20(L) 24 -  Calcium 8.9 - 10.3 mg/dL 9.0 9.4 -  Total Protein 6.5 - 8.1 g/dL 6.4(L) 6.7 -  Total Bilirubin 0.3 - 1.2 mg/dL 0.4 0.7 -  Alkaline Phos 38 - 126 U/L 139(H) 52 -  AST 15 - 41 U/L 19 22 -  ALT 14 - 54 U/L 14 27 -   LDH 145 Uric acid 4.3 PCR 0.06  Donnel Saxon, CNM 11/01/15 6p

## 2015-11-01 NOTE — Progress Notes (Signed)
  Subjective: Doing well--not aware of any UCs.  Objective: BP 120/57 mmHg  Pulse 106  Temp(Src) 98.6 F (37 C) (Oral)  Resp 18  Ht 5\' 7"  (1.702 m)  Wt 117.935 kg (260 lb)  BMI 40.71 kg/m2  LMP 01/20/2015      FHT: Category 1 UC:   Irregular, mild, patient unaware SVE:   Dilation: 2 Effacement (%): 70 Station: -2 Exam by:: Jocelyn Lamer Rasheedah Reis, cnm, bloody mucus noted prior to exam Cervix remains very posterior Several attempts at placement of foley bulb with and without speculum.  It was extruded every time from the cervix.  Assessment:  Induction for post-dates GBS negative Equivocal FHR testing today, now Category 1  Plan: Consulted with Dr. Patsy Lager plan pitocin initiation. Will allow patient to have a light meal prior to initiation.  [name] [name] CNM 11/01/2015, 6:55 PM

## 2015-11-02 ENCOUNTER — Encounter (HOSPITAL_COMMUNITY): Payer: Self-pay | Admitting: *Deleted

## 2015-11-02 ENCOUNTER — Inpatient Hospital Stay (HOSPITAL_COMMUNITY): Payer: Medicaid Other | Admitting: Anesthesiology

## 2015-11-02 LAB — RPR: RPR Ser Ql: NONREACTIVE

## 2015-11-02 LAB — HIV ANTIBODY (ROUTINE TESTING W REFLEX): HIV Screen 4th Generation wRfx: NONREACTIVE

## 2015-11-02 MED ORDER — LACTATED RINGERS IV SOLN: 500.0000 mL | Freq: Once | INTRAVENOUS | Status: DC

## 2015-11-02 MED ORDER — ONDANSETRON HCL 4 MG/2ML IJ SOLN: 4.0000 mg | INTRAMUSCULAR | Status: DC | PRN

## 2015-11-02 MED ORDER — DIPHENHYDRAMINE HCL 25 MG PO CAPS: 25.0000 mg | ORAL_CAPSULE | Freq: Four times a day (QID) | ORAL | Status: DC | PRN

## 2015-11-02 MED ORDER — LANOLIN HYDROUS EX OINT: TOPICAL_OINTMENT | CUTANEOUS | Status: DC | PRN

## 2015-11-02 MED ORDER — EPHEDRINE 5 MG/ML INJ
10.0000 mg | INTRAVENOUS | Status: DC | PRN
  Filled 2015-11-02: qty 2

## 2015-11-02 MED ORDER — ZOLPIDEM TARTRATE 5 MG PO TABS: 5.0000 mg | ORAL_TABLET | Freq: Every evening | ORAL | Status: DC | PRN

## 2015-11-02 MED ORDER — PRENATAL MULTIVITAMIN CH
1.0000 | ORAL_TABLET | Freq: Every day | ORAL | Status: DC
  Administered 2015-11-03 – 2015-11-04 (×2): 1 via ORAL
  Filled 2015-11-02 (×2): qty 1

## 2015-11-02 MED ORDER — WITCH HAZEL-GLYCERIN EX PADS: 1.0000 "application " | MEDICATED_PAD | CUTANEOUS | Status: DC | PRN

## 2015-11-02 MED ORDER — TETANUS-DIPHTH-ACELL PERTUSSIS 5-2.5-18.5 LF-MCG/0.5 IM SUSP: 0.5000 mL | Freq: Once | INTRAMUSCULAR | Status: DC

## 2015-11-02 MED ORDER — SODIUM CHLORIDE 0.9 % IV SOLN
3.0000 g | Freq: Four times a day (QID) | INTRAVENOUS | Status: AC
  Administered 2015-11-02 – 2015-11-03 (×4): 3 g via INTRAVENOUS
  Filled 2015-11-02 (×4): qty 3

## 2015-11-02 MED ORDER — SENNOSIDES-DOCUSATE SODIUM 8.6-50 MG PO TABS
2.0000 | ORAL_TABLET | ORAL | Status: DC
  Administered 2015-11-03 (×2): 2 via ORAL
  Filled 2015-11-02 (×2): qty 2

## 2015-11-02 MED ORDER — FENTANYL 2.5 MCG/ML BUPIVACAINE 1/10 % EPIDURAL INFUSION (WH - ANES)
INTRAMUSCULAR | Status: AC
  Filled 2015-11-02: qty 125

## 2015-11-02 MED ORDER — DIBUCAINE 1 % RE OINT: 1.0000 "application " | TOPICAL_OINTMENT | RECTAL | Status: DC | PRN

## 2015-11-02 MED ORDER — PHENYLEPHRINE 40 MCG/ML (10ML) SYRINGE FOR IV PUSH (FOR BLOOD PRESSURE SUPPORT)
PREFILLED_SYRINGE | INTRAVENOUS | Status: AC
  Filled 2015-11-02: qty 20

## 2015-11-02 MED ORDER — PHENYLEPHRINE 40 MCG/ML (10ML) SYRINGE FOR IV PUSH (FOR BLOOD PRESSURE SUPPORT)
80.0000 ug | PREFILLED_SYRINGE | INTRAVENOUS | Status: DC | PRN
  Administered 2015-11-02: 80 ug via INTRAVENOUS
  Filled 2015-11-02: qty 2

## 2015-11-02 MED ORDER — LIDOCAINE HCL (PF) 1 % IJ SOLN
INTRAMUSCULAR | Status: DC | PRN
  Administered 2015-11-02: 3 mL via EPIDURAL
  Administered 2015-11-02: 5 mL via EPIDURAL
  Administered 2015-11-02: 2 mL via EPIDURAL

## 2015-11-02 MED ORDER — FENTANYL 2.5 MCG/ML BUPIVACAINE 1/10 % EPIDURAL INFUSION (WH - ANES)
14.0000 mL/h | INTRAMUSCULAR | Status: DC | PRN
  Administered 2015-11-02 (×2): 14 mL/h via EPIDURAL
  Filled 2015-11-02: qty 125

## 2015-11-02 MED ORDER — PHENYLEPHRINE 40 MCG/ML (10ML) SYRINGE FOR IV PUSH (FOR BLOOD PRESSURE SUPPORT)
80.0000 ug | PREFILLED_SYRINGE | INTRAVENOUS | Status: DC | PRN
  Filled 2015-11-02: qty 2

## 2015-11-02 MED ORDER — IBUPROFEN 600 MG PO TABS
600.0000 mg | ORAL_TABLET | Freq: Four times a day (QID) | ORAL | Status: DC
  Administered 2015-11-02 – 2015-11-03 (×3): 600 mg via ORAL
  Filled 2015-11-02 (×5): qty 1

## 2015-11-02 MED ORDER — BENZOCAINE-MENTHOL 20-0.5 % EX AERO: 1.0000 "application " | INHALATION_SPRAY | CUTANEOUS | Status: DC | PRN

## 2015-11-02 MED ORDER — DIPHENHYDRAMINE HCL 50 MG/ML IJ SOLN: 12.5000 mg | INTRAMUSCULAR | Status: DC | PRN

## 2015-11-02 MED ORDER — MISOPROSTOL 200 MCG PO TABS
ORAL_TABLET | ORAL | Status: AC
  Filled 2015-11-02: qty 5

## 2015-11-02 MED ORDER — ONDANSETRON HCL 4 MG PO TABS
4.0000 mg | ORAL_TABLET | ORAL | Status: DC | PRN
Start: 2015-11-02 — End: 2015-11-04

## 2015-11-02 MED ORDER — SODIUM CHLORIDE 0.9% FLUSH
3.0000 mL | INTRAVENOUS | Status: DC | PRN
  Administered 2015-11-02: 3 mL via INTRAVENOUS
  Filled 2015-11-02: qty 3

## 2015-11-02 MED ORDER — SIMETHICONE 80 MG PO CHEW: 80.0000 mg | CHEWABLE_TABLET | ORAL | Status: DC | PRN

## 2015-11-02 MED ORDER — ACETAMINOPHEN 325 MG PO TABS
650.0000 mg | ORAL_TABLET | ORAL | Status: DC | PRN
  Administered 2015-11-03 (×2): 650 mg via ORAL
  Filled 2015-11-02 (×3): qty 2

## 2015-11-02 MED ORDER — HEPARIN SODIUM (PORCINE) 5000 UNIT/ML IJ SOLN: 5000.0000 [IU] | Freq: Three times a day (TID) | INTRAMUSCULAR | Status: DC

## 2015-11-02 NOTE — Anesthesia Preprocedure Evaluation (Signed)
Anesthesia Evaluation  Patient identified by MRN, date of birth, ID band Patient awake    Reviewed: Allergy & Precautions, NPO status , Patient's Chart, lab work & pertinent test results  Airway Mallampati: III  TM Distance: >3 FB Neck ROM: Full    Dental  (+) Teeth Intact, Dental Advisory Given   Pulmonary former smoker,    Pulmonary exam normal breath sounds clear to auscultation       Cardiovascular negative cardio ROS Normal cardiovascular exam Rhythm:Regular Rate:Normal     Neuro/Psych negative neurological ROS  negative psych ROS   GI/Hepatic negative GI ROS, Neg liver ROS,   Endo/Other  Morbid obesity  Renal/GU negative Renal ROS     Musculoskeletal negative musculoskeletal ROS (+)   Abdominal   Peds  Hematology negative hematology ROS (+) Plt 220k   Anesthesia Other Findings Day of surgery medications reviewed with the patient.  Reproductive/Obstetrics (+) Pregnancy                             Anesthesia Physical Anesthesia Plan  ASA: III  Anesthesia Plan: Epidural   Post-op Pain Management:    Induction:   Airway Management Planned:   Additional Equipment:   Intra-op Plan:   Post-operative Plan:   Informed Consent: I have reviewed the patients History and Physical, chart, labs and discussed the procedure including the risks, benefits and alternatives for the proposed anesthesia with the patient or authorized representative who has indicated his/her understanding and acceptance.   Dental advisory given  Plan Discussed with:   Anesthesia Plan Comments: (Patient identified. Risks/Benefits/Options discussed with patient including but not limited to bleeding, infection, nerve damage, paralysis, failed block, incomplete pain control, headache, blood pressure changes, nausea, vomiting, reactions to medication both or allergic, itching and postpartum back pain. Confirmed  with bedside nurse the patient's most recent platelet count. Confirmed with patient that they are not currently taking any anticoagulation, have any bleeding history or any family history of bleeding disorders. Patient expressed understanding and wished to proceed. All questions were answered. )        Anesthesia Quick Evaluation

## 2015-11-02 NOTE — Progress Notes (Signed)
Addendum  Filed Vitals:   11/02/15 1101 11/02/15 1131 11/02/15 1150 11/02/15 1201  BP: 141/60 127/66  119/47  Pulse: 120 110  126  Temp:  101 F (38.3 C)    TempSrc:  Axillary    Resp: 18 20 18 20   Height:      Weight:       Pt c/o increase pressure VE C/C/+2 Trial pushing unsuccessful Pt allowed to labor down Anticipate vaginal delivery abx in postpartum

## 2015-11-02 NOTE — Progress Notes (Signed)
FSE replaced. When cervix checked prior fse came off.

## 2015-11-02 NOTE — Progress Notes (Addendum)
Labor Progress  Subjective: No complaints, comfortable with epidural  Objective: BP 124/60 mmHg  Pulse 113  Temp(Src) 98 F (36.7 C) (Oral)  Resp 18  Ht 5\' 7"  (1.702 m)  Wt 260 lb (117.935 kg)  BMI 40.71 kg/m2  LMP 01/20/2015     FHT: 140, moderate variability + accel, variable decels CTX:  regular, every 3-4 minutes Uterus gravid, soft non tender SVE:  Dilation: 6.5 Effacement (%): 100 Station: -1 Exam by:: V. Hermila Millis, CNM Pitocin at 25mUn/min  Assessment:  IUP at 40.6 weeks IOL d/t NRFHT NICHD: Category 2 Membranes:  AROM x 23.5hrs, no s/s of infection  MVUs 165-200  Induction:   Cytotec n/a  Foley Bulb:n/a  Pitocin -  Yes  Pain management:  IV pain management: n/a  Nitrous: n/a Epidural placement:yes  GBS negative  Plan: Continue labor plan Continuous monitoring Rest Frequent position changes to facilitate fetal rotation and descent. Will reassess with cervical exam at 1100 or earlier if necessary Continue pitocin per protocol   Sandee Bernath, CNM, MSN 11/02/2015. 9:24 AM     Addendum 0940 Pt c/o uncontrollable shaking VE 9/80/-1 with swollen anterior lip 140, moderate variability occasional variable decel Turn to left side with peanut Amnioinfusion with bolus of 300 cc, then 150 cc/hr.

## 2015-11-02 NOTE — Anesthesia Postprocedure Evaluation (Signed)
Anesthesia Post Note  Patient: [name] [name]  Procedure(s) Performed: * No procedures listed *  Patient location during evaluation: Mother Baby Anesthesia Type: Epidural Level of consciousness: awake and alert and oriented Pain management: satisfactory to patient Vital Signs Assessment: post-procedure vital signs reviewed and stable Respiratory status: spontaneous breathing and nonlabored ventilation Cardiovascular status: stable Postop Assessment: no headache, no backache, no signs of nausea or vomiting, adequate PO intake and patient able to bend at knees (patient up walking) Anesthetic complications: no    Last Vitals:  Filed Vitals:   11/02/15 1545 11/02/15 1714  BP: 116/53 107/43  Pulse: 118 118  Temp: 37.3 C 37.1 C  Resp: 16 16    Last Pain:  Filed Vitals:   11/02/15 1739  PainSc: 0-No pain                 Willye Javier

## 2015-11-02 NOTE — Anesthesia Procedure Notes (Signed)
Epidural Patient location during procedure: OB  Staffing Anesthesiologist: Catalina Gravel Performed by: anesthesiologist   Preanesthetic Checklist Completed: patient identified, pre-op evaluation, timeout performed, IV checked, risks and benefits discussed and monitors and equipment checked  Epidural Patient position: sitting Prep: DuraPrep Patient monitoring: blood pressure and continuous pulse ox Approach: midline Location: L3-L4 Injection technique: LOR air  Needle:  Needle type: Tuohy  Needle gauge: 17 G Needle length: 9 cm Needle insertion depth: 8 cm Catheter size: 19 Gauge Catheter at skin depth: 13 cm Test dose: negative and Other (1% Lidocaine)  Additional Notes Patient identified.  Risk benefits discussed including failed block, incomplete pain control, headache, nerve damage, paralysis, blood pressure changes, nausea, vomiting, reactions to medication both toxic or allergic, and postpartum back pain.  Patient expressed understanding and wished to proceed.  All questions were answered.  Sterile technique used throughout procedure and epidural site dressed with sterile barrier dressing. No paresthesia or other complications noted. The patient did not experience any signs of intravascular injection such as tinnitus or metallic taste in mouth nor signs of intrathecal spread such as rapid motor block. Please see nursing notes for vital signs. Reason for block:procedure for pain

## 2015-11-02 NOTE — Lactation Note (Signed)
This note was copied from a baby's chart. Lactation Consultation Note  Patient Name: [name] [name] [id number] Date: 11/02/2015 Reason for consult: Initial assessment Baby at 9 hr of life and mom reports bf is going well. She stated that baby was having a hard time but "has the hang of it now". She was feeling bad but took a nap and is better now. Answered questions about latching vs pumping. Demonstrated manual expression, no colostrum noted, spoon in room. Discussed baby behavior, feeding frequency, baby belly size, voids, wt loss, breast changes, and nipple care. Given lactation handouts. Aware of OP services and support group.      Maternal Data Has patient been taught Hand Expression?: Yes  Feeding Feeding Type: Breast Fed Length of feed: 10 min  LATCH Score/Interventions Latch: Repeated attempts needed to sustain latch, nipple held in mouth throughout feeding, stimulation needed to elicit sucking reflex. Intervention(s): Adjust position;Assist with latch;Breast massage;Breast compression  Audible Swallowing: None Intervention(s): Skin to skin Intervention(s): Skin to skin  Type of Nipple: Everted at rest and after stimulation  Comfort (Breast/Nipple): Soft / non-tender     Hold (Positioning): Assistance needed to correctly position infant at breast and maintain latch. Intervention(s): Breastfeeding basics reviewed;Support Pillows;Position options;Skin to skin  LATCH Score: 6  Lactation Tools Discussed/Used WIC Program: No   Consult Status Consult Status: Follow-up Date: 11/03/15 Follow-up type: In-patient    Denzil Hughes 11/02/2015, 9:51 PM

## 2015-11-03 ENCOUNTER — Encounter (HOSPITAL_COMMUNITY): Payer: Self-pay | Admitting: *Deleted

## 2015-11-03 LAB — CBC
HCT: 28.1 % — ABNORMAL LOW (ref 36.0–46.0)
HEMOGLOBIN: 9.4 g/dL — AB (ref 12.0–15.0)
MCH: 29.3 pg (ref 26.0–34.0)
MCHC: 33.5 g/dL (ref 30.0–36.0)
MCV: 87.5 fL (ref 78.0–100.0)
PLATELETS: 172 10*3/uL (ref 150–400)
RBC: 3.21 MIL/uL — AB (ref 3.87–5.11)
RDW: 14.6 % (ref 11.5–15.5)
WBC: 14.5 10*3/uL — AB (ref 4.0–10.5)

## 2015-11-03 MED ORDER — OXYCODONE-ACETAMINOPHEN 5-325 MG PO TABS
1.0000 | ORAL_TABLET | Freq: Four times a day (QID) | ORAL | Status: DC | PRN
  Administered 2015-11-03 – 2015-11-04 (×3): 1 via ORAL
  Filled 2015-11-03 (×3): qty 1

## 2015-11-03 NOTE — Lactation Note (Addendum)
This note was copied from a baby's chart. Lactation Consultation Note  Patient Name: [name] [name] [id number] Date: 11/03/2015 Reason for consult: Follow-up assessment Baby at 84 hr of life and mom is having a hard time latching baby. Mom is reporting bilateral nipple pain, no skin break down or bruises noted. Baby wants to suck his lips and his thumb. He does not open his mouth wide. He has a tight thick upper labial frenulum, he has lingual frenulum with an insertion point behind mid tongue. He bites down on a gloved finger but will extend tongue over gum ridge, lift tongue past midline, has nice peristolic tongue movement, and decent lateralization of tongue. Mom has wide spaced breast with nipples that point down and out. Because of mom's body shape she is having a hard time finding a position to latch baby. Applied #24 NS, it appeared too large but mom was done with bf so the #20 was not fitted, it was given to the RN to try at next feeding. Baby was able to go on both breast for about 5 minutes. Colostrum was noted in the NS bilaterally and 3 swallows were heard while baby was on the R breast. Mom was very irritated with LC and FOB. She wanted to have her meal and go to the bathroom. She stated every time she wants to do something the baby needs her. She asked about pumping and offering formula. Encouraged mom to take a break and try again later.   Maternal Data    Feeding Feeding Type: Breast Fed Length of feed: 0 min  LATCH Score/Interventions Latch: Repeated attempts needed to sustain latch, nipple held in mouth throughout feeding, stimulation needed to elicit sucking reflex. Intervention(s): Skin to skin;Teach feeding cues Intervention(s): Assist with latch;Adjust position;Breast compression  Audible Swallowing: Spontaneous and intermittent Intervention(s): Skin to skin;Hand expression Intervention(s): Alternate breast massage  Type of Nipple: Everted at rest and after  stimulation  Comfort (Breast/Nipple): Filling, red/small blisters or bruises, mild/mod discomfort  Problem noted: Severe discomfort  Hold (Positioning): Full assist, staff holds infant at breast Intervention(s): Support Pillows;Position options  LATCH Score: 6  Lactation Tools Discussed/Used     Consult Status Consult Status: Follow-up Date: 11/04/15 Follow-up type: In-patient    Denzil Hughes 11/03/2015, 6:19 PM

## 2015-11-03 NOTE — Progress Notes (Signed)
Per RN request, in to evaluated pt.   Pt currently pumping right breast.  Reports she is currently doing well, pain rated 2/10, s/p taking tylenol at 1824.   Describes occasional abdominal cramping with periods of  cramping pain rate 8/10 earlier in the day. Pt reassured that cramping is common with breastfeeding moms.  Pt encouraged to take ibuprofen or percocet. PRN if her pain increases. Also reports passing gas but feeling constipated. Recently drank prune juice.  Encouraged to take stool softners.    Cherre Huger 11/03/2015, 7:54 PM

## 2015-11-03 NOTE — Progress Notes (Signed)
UR chart review completed.  

## 2015-11-03 NOTE — Progress Notes (Signed)
Called Lavetta Nielsen CNM regarding pt's c/o concern over 4/10 upper abdominal pain, described as intermittent cramping. Pt refusing motrin, is taking tylenol, had last at 1824. Pt reports pain 2/10 pain when not cramping that is manageable. Pt denies lower abdominal cramping, reports some lower back "cramping" intermittently. Pt denies fundal tenderness. Pt seems to be worried and wants to be seen. Per Apolonio Schneiders, she will be around to see pt. Pt informed. Will continue to monitor.

## 2015-11-03 NOTE — Progress Notes (Signed)
Attempted to hang IV Antibiotic and site started to swell and get painful to the patient. Notified Venus Standard,CNM. She said to restart another IV.

## 2015-11-04 MED ORDER — OXYCODONE HCL 5 MG PO TABA: 1.0000 | ORAL_TABLET | ORAL | Status: DC | PRN

## 2015-11-04 NOTE — Discharge Summary (Signed)
[facility] Discharge Summary   Patient Name:   [name] [name] DOB:     1996/03/12 MRN:     [id number]  Date of Admission:   11/01/2015 Date of Discharge:  11/04/2015  Admitting diagnosis:    INDUCTION Principal Problem:   Normal vaginal delivery Active Problems:   Obesity (BMI 35.0-39.9 without comorbidity) ([place])   Abnormal fetal heart rate or rhythm (FHR)   Post term pregnancy over 40 weeks  Term Pregnancy Delivered    Discharge diagnosis:    INDUCTION Principal Problem:   Normal vaginal delivery Active Problems:   Obesity (BMI 35.0-39.9 without comorbidity) ([facility])   Abnormal fetal heart rate or rhythm (FHR)   Post term pregnancy over 40 weeks  Term Pregnancy Delivered      Anemia due to blood loss                                                                Post partum procedures: None  Type of Delivery:  SVB  Delivering Provider: Sallee Provencal   Date of Delivery:  11/02/15  Newborn Data:    Live born female  Birth Weight: 8 lb 2.9 oz (3710 g) APGAR: 9, 9  Baby's Name:  Jordyn Baby Feeding:   Breast Disposition:   home with mother  Complications:   None  Hospital course:      Induction of Labor With Vaginal Delivery   20 y.o. yo G1P1001 at [redacted]w[redacted]d was admitted to the hospital 11/01/2015 for induction of labor.  Indication for induction: equivocal FHR testing..  Patient had an uncomplicated labor course as follows: Membrane Rupture Time/Date: 8:15 PM ,11/01/2015   Intrapartum Procedures: Episiotomy: None [1]                                         Lacerations:  1st degree [2];Labial [10]  Patient had delivery of a Viable infant.  Information for the patient's newborn:  [place], Garbutt [id number]  Delivery Method: Vaginal, Spontaneous Delivery (Filed from Delivery Summary)    11/02/2015  Details of delivery can be found in separate delivery note.  Patient had a routine postpartum course. Patient is discharged home  11/04/2015.   Physical Exam:   Filed Vitals:   11/03/15 1744 11/03/15 1910 11/04/15 0210 11/04/15 0644  BP: 109/51 124/60 122/63 134/81  Pulse: 82 88 87 85  Temp: 98.5 F (36.9 C) 97.6 F (36.4 C) 97.6 F (36.4 C)   TempSrc: Oral Oral Oral   Resp: 18 18 18 18   Height:      Weight:      SpO2:  100%     General: alert Lochia: appropriate Uterine Fundus: firm Incision: Healing well with no significant drainage DVT Evaluation: No evidence of DVT seen on physical exam. Negative Homan's sign.  Labs: CBC Latest Ref Rng 11/03/2015 11/01/2015 10/25/2014  WBC 4.0 - 10.5 K/uL 14.5(H) 9.5 9.7  Hemoglobin 12.0 - 15.0 g/dL 9.4(L) 12.1 13.7  Hematocrit 36.0 - 46.0 % 28.1(L) 35.4(L) 41.2  Platelets 150 - 400 K/uL 172 220 224    CMP Latest Ref Rng 11/01/2015  Glucose 65 - 99 mg/dL 75  BUN 6 -  20 mg/dL 11  Creatinine 0.44 - 1.00 mg/dL 0.56  Sodium 135 - 145 mmol/L 137  Potassium 3.5 - 5.1 mmol/L 4.2  Chloride 101 - 111 mmol/L 108  CO2 22 - 32 mmol/L 20(L)  Calcium 8.9 - 10.3 mg/dL 9.0  Total Protein 6.5 - 8.1 g/dL 6.4(L)  Total Bilirubin 0.3 - 1.2 mg/dL 0.4  Alkaline Phos 38 - 126 U/L 139(H)  AST 15 - 41 U/L 19  ALT 14 - 54 U/L 14    Discharge instruction: per After Visit Summary and "Baby and Me Booklet".  After Visit Meds:    Medication List    TAKE these medications        OxyCODONE HCl (Abuse Deter) 5 MG Taba  Commonly known as:  OXAYDO  Take 1 tablet by mouth every 4 (four) hours as needed.     prenatal multivitamin Tabs tablet  Take 1 tablet by mouth daily at 12 noon.      OTC Fe daily  Diet: routine diet  Activity: Advance as tolerated. Pelvic rest for 6 weeks.   Outpatient follow up:5 weeks Follow up Appt:No future appointments. Follow up visit: No Follow-up on file.  Postpartum contraception: IUD Mirena  11/04/2015 Donnel Saxon, CNM

## 2015-11-04 NOTE — Progress Notes (Deleted)
[facility] [place] Discharge Summary   Patient Name:   [name] [name] DOB:     17-Jun-1996 MRN:     [id number]  Date of Admission:   11/01/2015 Date of Discharge:  11/04/2015  Admitting diagnosis:    INDUCTION Principal Problem:   Normal vaginal delivery Active Problems:   Obesity (BMI 35.0-39.9 without comorbidity) ([place])   Abnormal fetal heart rate or rhythm (FHR)   Post term pregnancy over 40 weeks  Term Pregnancy Delivered    Discharge diagnosis:    INDUCTION Principal Problem:   Normal vaginal delivery Active Problems:   Obesity (BMI 35.0-39.9 without comorbidity) ([facility])   Abnormal fetal heart rate or rhythm (FHR)   Post term pregnancy over 40 weeks  Term Pregnancy Delivered      Anemia due to blood loss                                                                Post partum procedures: None  Type of Delivery:  SVB  Delivering Provider: Sallee Provencal   Date of Delivery:  11/02/15  Newborn Data:    Live born female  Birth Weight: 8 lb 2.9 oz (3710 g) APGAR: 9, 9  Baby's Name:  Jordyn Baby Feeding:   Breast Disposition:   home with mother  Complications:   None  Hospital course:      Induction of Labor With Vaginal Delivery   20 y.o. yo G1P1001 at [redacted]w[redacted]d was admitted to the hospital 11/01/2015 for induction of labor.  Indication for induction: equivocal FHR testing..  Patient had an uncomplicated labor course as follows: Membrane Rupture Time/Date: 8:15 PM ,11/01/2015   Intrapartum Procedures: Episiotomy: None [1]                                         Lacerations:  1st degree [2];Labial [10]  Patient had delivery of a Viable infant.  Information for the patient's newborn:  Makailyn, Fedele [id number]  Delivery Method: Vaginal, Spontaneous Delivery (Filed from Delivery Summary)    11/02/2015  Details of delivery can be found in separate delivery note.  Patient had a routine postpartum course. Patient is discharged home  11/04/2015.   Physical Exam:   Filed Vitals:   11/03/15 1744 11/03/15 1910 11/04/15 0210 11/04/15 0644  BP: 109/51 124/60 122/63 134/81  Pulse: 82 88 87 85  Temp: 98.5 F (36.9 C) 97.6 F (36.4 C) 97.6 F (36.4 C)   TempSrc: Oral Oral Oral   Resp: 18 18 18 18   Height:      Weight:      SpO2:  100%     General: alert Lochia: appropriate Uterine Fundus: firm Incision: Healing well with no significant drainage DVT Evaluation: No evidence of DVT seen on physical exam. Negative Homan's sign.  Labs: CBC Latest Ref Rng 11/03/2015 11/01/2015 10/25/2014  WBC 4.0 - 10.5 K/uL 14.5(H) 9.5 9.7  Hemoglobin 12.0 - 15.0 g/dL 9.4(L) 12.1 13.7  Hematocrit 36.0 - 46.0 % 28.1(L) 35.4(L) 41.2  Platelets 150 - 400 K/uL 172 220 224    CMP Latest Ref Rng 11/01/2015  Glucose 65 - 99 mg/dL 75  BUN 6 -  20 mg/dL 11  Creatinine 0.44 - 1.00 mg/dL 0.56  Sodium 135 - 145 mmol/L 137  Potassium 3.5 - 5.1 mmol/L 4.2  Chloride 101 - 111 mmol/L 108  CO2 22 - 32 mmol/L 20(L)  Calcium 8.9 - 10.3 mg/dL 9.0  Total Protein 6.5 - 8.1 g/dL 6.4(L)  Total Bilirubin 0.3 - 1.2 mg/dL 0.4  Alkaline Phos 38 - 126 U/L 139(H)  AST 15 - 41 U/L 19  ALT 14 - 54 U/L 14    Discharge instruction: per After Visit Summary and "Baby and Me Booklet".  After Visit Meds:    Medication List    TAKE these medications        OxyCODONE HCl (Abuse Deter) 5 MG Taba  Commonly known as:  OXAYDO  Take 1 tablet by mouth every 4 (four) hours as needed.     prenatal multivitamin Tabs tablet  Take 1 tablet by mouth daily at 12 noon.      OTC Fe daily  Diet: routine diet  Activity: Advance as tolerated. Pelvic rest for 6 weeks.   Outpatient follow up:5 weeks Follow up Appt:No future appointments. Follow up visit: No Follow-up on file.  Postpartum contraception: IUD Mirena  11/04/2015 Donnel Saxon, CNM

## 2015-11-04 NOTE — Discharge Instructions (Signed)
Postpartum Care After Vaginal Delivery °After you deliver your newborn (postpartum period), the usual stay in the hospital is 24-72 hours. If there were problems with your labor or delivery, or if you have other medical problems, you might be in the hospital longer.  °While you are in the hospital, you will receive help and instructions on how to care for yourself and your newborn during the postpartum period.  °While you are in the hospital: °· Be sure to tell your nurses if you have pain or discomfort, as well as where you feel the pain and what makes the pain worse. °· If you had an incision made near your vagina (episiotomy) or if you had some tearing during delivery, the nurses may put ice packs on your episiotomy or tear. The ice packs may help to reduce the pain and swelling. °· If you are breastfeeding, you may feel uncomfortable contractions of your uterus for a couple of weeks. This is normal. The contractions help your uterus get back to normal size. °· It is normal to have some bleeding after delivery. °· For the first 1-3 days after delivery, the flow is red and the amount may be similar to a period. °· It is common for the flow to start and stop. °· In the first few days, you may pass some small clots. Let your nurses know if you begin to pass large clots or your flow increases. °· Do not  flush blood clots down the toilet before having the nurse look at them. °· During the next 3-10 days after delivery, your flow should become more watery and pink or brown-tinged in color. °· Ten to fourteen days after delivery, your flow should be a small amount of yellowish-white discharge. °· The amount of your flow will decrease over the first few weeks after delivery. Your flow may stop in 6-8 weeks. Most women have had their flow stop by 12 weeks after delivery. °· You should change your sanitary pads frequently. °· Wash your hands thoroughly with soap and water for at least 20 seconds after changing pads, using  the toilet, or before holding or feeding your newborn. °· You should feel like you need to empty your bladder within the first 6-8 hours after delivery. °· In case you become weak, lightheaded, or faint, call your nurse before you get out of bed for the first time and before you take a shower for the first time. °· Within the first few days after delivery, your breasts may begin to feel tender and full. This is called engorgement. Breast tenderness usually goes away within 48-72 hours after engorgement occurs. You may also notice milk leaking from your breasts. If you are not breastfeeding, do not stimulate your breasts. Breast stimulation can make your breasts produce more milk. °· Spending as much time as possible with your newborn is very important. During this time, you and your newborn can feel close and get to know each other. Having your newborn stay in your room (rooming in) will help to strengthen the bond with your newborn.  It will give you time to get to know your newborn and become comfortable caring for your newborn. °· Your hormones change after delivery. Sometimes the hormone changes can temporarily cause you to feel sad or tearful. These feelings should not last more than a few days. If these feelings last longer than that, you should talk to your caregiver. °· If desired, talk to your caregiver about methods of family planning or contraception. °·   Talk to your caregiver about immunizations. Your caregiver may want you to have the following immunizations before leaving the hospital:  Tetanus, diphtheria, and pertussis (Tdap) or tetanus and diphtheria (Td) immunization. It is very important that you and your family (including grandparents) or others caring for your newborn are up-to-date with the Tdap or Td immunizations. The Tdap or Td immunization can help protect your newborn from getting ill.  Rubella immunization.  Varicella (chickenpox) immunization.  Influenza immunization. You should  receive this annual immunization if you did not receive the immunization during your pregnancy.   This information is not intended to replace advice given to you by your health care provider. Make sure you discuss any questions you have with your health care provider.   Document Released: 05/20/2007 Document Revised: 04/16/2012 Document Reviewed: 03/19/2012 Elsevier Interactive Patient Education 2016 [employer].  Iron Deficiency Anemia, Adult Anemia is a condition in which there are less red blood cells or hemoglobin in the blood than normal. Hemoglobin is the part of red blood cells that carries oxygen. Iron deficiency anemia is anemia caused by too little iron. It is the most common type of anemia. It may leave you tired and short of breath. CAUSES   Lack of iron in the diet.  Poor absorption of iron, as seen with intestinal disorders.  Intestinal bleeding.  Heavy periods. SIGNS AND SYMPTOMS  Mild anemia may not be noticeable. Symptoms may include:  Fatigue.  Headache.  Pale skin.  Weakness.  Tiredness.  Shortness of breath.  Dizziness.  Cold hands and feet.  Fast or irregular heartbeat. DIAGNOSIS  Diagnosis requires a thorough evaluation and physical exam by your health care provider. Blood tests are generally used to confirm iron deficiency anemia. Additional tests may be done to find the underlying cause of your anemia. These may include:  Testing for blood in the stool (fecal occult blood test).  A procedure to see inside the colon and rectum (colonoscopy).  A procedure to see inside the esophagus and stomach (endoscopy). TREATMENT  Iron deficiency anemia is treated by correcting the cause of the deficiency. Treatment may involve:  Adding iron-rich foods to your diet.  Taking iron supplements. Pregnant or breastfeeding women need to take extra iron because their normal diet usually does not provide the required amount.  Taking vitamins. Vitamin C improves  the absorption of iron. Your health care provider may recommend that you take your iron tablets with a glass of orange juice or vitamin C supplement.  Medicines to make heavy menstrual flow lighter.  Surgery. HOME CARE INSTRUCTIONS   Take iron as directed by your health care provider.  If you cannot tolerate taking iron supplements by mouth, talk to your health care provider about taking them through a vein (intravenously) or an injection into a muscle.  For the best iron absorption, iron supplements should be taken on an empty stomach. If you cannot tolerate them on an empty stomach, you may need to take them with food.  Do not drink milk or take antacids at the same time as your iron supplements. Milk and antacids may interfere with the absorption of iron.  Iron supplements can cause constipation. Make sure to include fiber in your diet to prevent constipation. A stool softener may also be recommended.  Take vitamins as directed by your health care provider.  Eat a diet rich in iron. Foods high in iron include liver, lean beef, whole-grain bread, eggs, dried fruit, and dark green leafy vegetables. SEEK IMMEDIATE  MEDICAL CARE IF:   You faint. If this happens, do not drive. Call your local emergency services (911 in [place] if no other help is available.  You have chest pain.  You feel nauseous or vomit.  You have severe or increased shortness of breath with activity.  You feel weak.  You have a rapid heartbeat.  You have unexplained sweating.  You become light-headed when getting up from a chair or bed. MAKE SURE YOU:   Understand these instructions.  Will watch your condition.  Will get help right away if you are not doing well or get worse.   This information is not intended to replace advice given to you by your health care provider. Make sure you discuss any questions you have with your health care provider.   Document Released: 07/20/2000 Document Revised:  08/13/2014 Document Reviewed: 03/30/2013 Elsevier Interactive Patient Education [employer].

## 2015-11-28 ENCOUNTER — Inpatient Hospital Stay (HOSPITAL_COMMUNITY)
Admission: AD | Admit: 2015-11-28 | Discharge: 2015-11-28 | Disposition: A | Discharge status: Home / Self Care | Payer: Medicaid Other | Source: Ambulatory Visit | Attending: Obstetrics and Gynecology | Admitting: Obstetrics and Gynecology

## 2015-11-28 ENCOUNTER — Encounter (HOSPITAL_COMMUNITY): Payer: Self-pay | Admitting: *Deleted

## 2015-11-28 DIAGNOSIS — O9089 Other complications of the puerperium, not elsewhere classified: Secondary | ICD-10-CM | POA: Insufficient documentation

## 2015-11-28 DIAGNOSIS — R51 Headache: Secondary | ICD-10-CM | POA: Diagnosis present

## 2015-11-28 DIAGNOSIS — R519 Headache, unspecified: Secondary | ICD-10-CM

## 2015-11-28 LAB — CBC WITH DIFFERENTIAL/PLATELET
Basophils Absolute: 0 10*3/uL (ref 0.0–0.1)
Basophils Relative: 1 %
EOS ABS: 0.1 10*3/uL (ref 0.0–0.7)
EOS PCT: 2 %
HCT: 36.3 % (ref 36.0–46.0)
Hemoglobin: 12.3 g/dL (ref 12.0–15.0)
LYMPHS ABS: 2.5 10*3/uL (ref 0.7–4.0)
LYMPHS PCT: 39 %
MCH: 29.4 pg (ref 26.0–34.0)
MCHC: 33.9 g/dL (ref 30.0–36.0)
MCV: 86.8 fL (ref 78.0–100.0)
MONO ABS: 0.4 10*3/uL (ref 0.1–1.0)
Monocytes Relative: 6 %
Neutro Abs: 3.4 10*3/uL (ref 1.7–7.7)
Neutrophils Relative %: 52 %
Platelets: 236 10*3/uL (ref 150–400)
RBC: 4.18 MIL/uL (ref 3.87–5.11)
RDW: 13.4 % (ref 11.5–15.5)
WBC: 6.5 10*3/uL (ref 4.0–10.5)

## 2015-11-28 LAB — COMPREHENSIVE METABOLIC PANEL
ALT: 47 U/L (ref 14–54)
ANION GAP: 5 (ref 5–15)
AST: 26 U/L (ref 15–41)
Albumin: 3.9 g/dL (ref 3.5–5.0)
Alkaline Phosphatase: 53 U/L (ref 38–126)
BUN: 15 mg/dL (ref 6–20)
CALCIUM: 9.6 mg/dL (ref 8.9–10.3)
CO2: 26 mmol/L (ref 22–32)
CREATININE: 0.72 mg/dL (ref 0.44–1.00)
Chloride: 108 mmol/L (ref 101–111)
GLUCOSE: 82 mg/dL (ref 65–99)
Potassium: 4 mmol/L (ref 3.5–5.1)
SODIUM: 139 mmol/L (ref 135–145)
TOTAL PROTEIN: 6.5 g/dL (ref 6.5–8.1)
Total Bilirubin: 0.5 mg/dL (ref 0.3–1.2)

## 2015-11-28 LAB — URIC ACID: Uric Acid, Serum: 5 mg/dL (ref 2.3–6.6)

## 2015-11-28 LAB — LACTATE DEHYDROGENASE: LDH: 116 U/L (ref 98–192)

## 2015-11-28 LAB — PROTEIN / CREATININE RATIO, URINE
Creatinine, Urine: 47 mg/dL
Total Protein, Urine: <6 mg/dL

## 2015-11-28 MED ORDER — BUTALBITAL-APAP-CAFFEINE 50-325-40 MG PO TABS: 1.0000 | ORAL_TABLET | Freq: Four times a day (QID) | ORAL | Status: DC | PRN

## 2015-11-28 NOTE — MAU Note (Signed)
Urine in lab 

## 2015-11-28 NOTE — MAU Provider Note (Signed)
[name] [name] is a 20 y.o. G1P0 at 3 weeks post partum c/o HA since delivery that intensified this afternoon.  Her lunch consisted of french fries and sweet tea.  She rated the pain 7/10 but currently 5/10.  She said it feels better and declined pain mediation at this time.   History     Patient Active Problem List   Diagnosis Date Noted  . Normal vaginal delivery 11/02/2015  . Obesity (BMI 35.0-39.9 without comorbidity) ([place]) 11/01/2015  . Abnormal fetal heart rate or rhythm (FHR) 11/01/2015  . Post term pregnancy over 40 weeks 11/01/2015    Chief Complaint  Patient presents with  . Headache   HPI  OB History    Gravida Para Term Preterm AB TAB SAB Ectopic Multiple Living   1 1 1       0 1      Past Medical History  Diagnosis Date  . Streptococcus B carrier or suspected carrier   . Medical history non-contributory     Past Surgical History  Procedure Laterality Date  . No past surgeries      History reviewed. No pertinent family history.  Social History  Substance Use Topics  . Smoking status: Former Smoker    Quit date: 10/25/2010  . Smokeless tobacco: Never Used  . Alcohol Use: Yes     Comment: socially    Allergies: No Known Allergies  Prescriptions prior to admission  Medication Sig Dispense Refill Last Dose  . acetaminophen (TYLENOL) 500 MG tablet Take 1,000 mg by mouth every 6 (six) hours as needed for headache.   Past Week at Unknown time  . Prenatal Vit-Fe Fumarate-FA (PRENATAL MULTIVITAMIN) TABS tablet Take 1 tablet by mouth daily at 12 noon.   11/27/2015 at Unknown time  . OxyCODONE HCl, Abuse Deter, (OXAYDO) 5 MG TABA Take 1 tablet by mouth every 4 (four) hours as needed. (Patient not taking: Reported on 11/28/2015) 36 tablet 0 Not Taking at Unknown time    ROS See HPI above, all other systems are negative  Physical Exam   Blood pressure 135/73, pulse 66, temperature 97.5 F (36.4 C), temperature source Oral, resp. rate 18, currently  breastfeeding.  Physical Exam Ext:  WNL ABD: Soft, non tender to palpation, no rebound or guarding SVE:    ED Course  Assessment: unexplained prolong HA  Plan:  Labs. CBC, CMP, LDH uric acid, PCR.   Venus Standard, CNM, MSN 11/28/2015. 6:25 PM   Addendum [phone]) S:  Patient reports headache since delivery that is unrelieved with tylenol.  Patient states today headache was worse it's ever been.  Patient states she had an epidural with delivery and feels this may be related to headache.  O:  Filed Vitals:   11/28/15 1839 11/28/15 2022  BP: 114/65 109/5  Pulse: 65 74  Temp:    Resp:  18   Results for orders placed or performed during the hospital encounter of 11/28/15 (from the past 24 hour(s))  Protein / creatinine ratio, urine     Status: None   Collection Time: 11/28/15  5:25 PM  Result Value Ref Range   Creatinine, Urine 47.00 mg/dL   Total Protein, Urine <6 mg/dL   Protein Creatinine Ratio        0.00 - 0.15 mg/mg[Cre]  CBC with Differential/Platelet     Status: None   Collection Time: 11/28/15  6:36 PM  Result Value Ref Range   WBC 6.5 4.0 - 10.5 K/uL   RBC 4.18 3.87 -  5.11 MIL/uL   Hemoglobin 12.3 12.0 - 15.0 g/dL   HCT 36.3 36.0 - 46.0 %   MCV 86.8 78.0 - 100.0 fL   MCH 29.4 26.0 - 34.0 pg   MCHC 33.9 30.0 - 36.0 g/dL   RDW 13.4 11.5 - 15.5 %   Platelets 236 150 - 400 K/uL   Neutrophils Relative % 52 %   Neutro Abs 3.4 1.7 - 7.7 K/uL   Lymphocytes Relative 39 %   Lymphs Abs 2.5 0.7 - 4.0 K/uL   Monocytes Relative 6 %   Monocytes Absolute 0.4 0.1 - 1.0 K/uL   Eosinophils Relative 2 %   Eosinophils Absolute 0.1 0.0 - 0.7 K/uL   Basophils Relative 1 %   Basophils Absolute 0.0 0.0 - 0.1 K/uL  Lactate dehydrogenase     Status: None   Collection Time: 11/28/15  6:36 PM  Result Value Ref Range   LDH 116 98 - 192 U/L  Comprehensive metabolic panel     Status: None   Collection Time: 11/28/15  6:36 PM  Result Value Ref Range   Sodium 139 135 - 145 mmol/L    Potassium 4.0 3.5 - 5.1 mmol/L   Chloride 108 101 - 111 mmol/L   CO2 26 22 - 32 mmol/L   Glucose, Bld 82 65 - 99 mg/dL   BUN 15 6 - 20 mg/dL   Creatinine, Ser 0.72 0.44 - 1.00 mg/dL   Calcium 9.6 8.9 - 10.3 mg/dL   Total Protein 6.5 6.5 - 8.1 g/dL   Albumin 3.9 3.5 - 5.0 g/dL   AST 26 15 - 41 U/L   ALT 47 14 - 54 U/L   Alkaline Phosphatase 53 38 - 126 U/L   Total Bilirubin 0.5 0.3 - 1.2 mg/dL   GFR calc non Af Amer >60 >60 mL/min   GFR calc Af Amer >60 >60 mL/min   Anion gap 5 5 - 15  Uric acid     Status: None   Collection Time: 11/28/15  6:36 PM  Result Value Ref Range   Uric Acid, Serum 5.0 2.3 - 6.6 mg/dL    A: Postpartum State Headache  P: Rx sent for fiorcet Will send for neurology referall No q/c Encouraged to call if any questions or concerns arise prior to next scheduled office visit.  -Discharged to home in improved condition  Maryann Conners MSN, CNM 11/28/2015 9:22 PM

## 2015-11-28 NOTE — MAU Note (Addendum)
Has had a headache every day since she delivered 3/29.  Have gotten worse, now is having migraines.  Feels nauseated.  HA is worse when she lays down

## 2015-11-28 NOTE — Discharge Instructions (Signed)

## 2015-12-13 ENCOUNTER — Ambulatory Visit (INDEPENDENT_AMBULATORY_CARE_PROVIDER_SITE_OTHER): Payer: Medicaid Other | Admitting: Neurology

## 2015-12-13 ENCOUNTER — Encounter: Payer: Self-pay | Admitting: Neurology

## 2015-12-13 VITALS — BP 131/87 | HR 81 | Ht 67.0 in | Wt 255.2 lb

## 2015-12-13 DIAGNOSIS — M545 Low back pain: Secondary | ICD-10-CM | POA: Insufficient documentation

## 2015-12-13 DIAGNOSIS — IMO0002 Reserved for concepts with insufficient information to code with codable children: Secondary | ICD-10-CM | POA: Insufficient documentation

## 2015-12-13 DIAGNOSIS — M549 Dorsalgia, unspecified: Secondary | ICD-10-CM | POA: Insufficient documentation

## 2015-12-13 DIAGNOSIS — M5442 Lumbago with sciatica, left side: Secondary | ICD-10-CM | POA: Diagnosis not present

## 2015-12-13 DIAGNOSIS — G8929 Other chronic pain: Secondary | ICD-10-CM | POA: Insufficient documentation

## 2015-12-13 DIAGNOSIS — G43709 Chronic migraine without aura, not intractable, without status migrainosus: Secondary | ICD-10-CM

## 2015-12-13 MED ORDER — RIZATRIPTAN BENZOATE 5 MG PO TBDP: 5.0000 mg | ORAL_TABLET | ORAL | Status: DC | PRN

## 2015-12-13 MED ORDER — PROMETHAZINE HCL 25 MG PO TABS: 25.0000 mg | ORAL_TABLET | Freq: Four times a day (QID) | ORAL | Status: DC | PRN

## 2015-12-13 NOTE — Patient Instructions (Addendum)
Magnesium oxide 400 mg twice a day Riboflavin 100 mg twice a day  You may take Tylenol 500 mg plus Phenergan 25 mg as needed at the onset of severe migraine headaches, light on resting, If the headache would not go away in 24 hours, you may take Maxalt 5 mg as needed, discard the milk within 24 hours after the medications, you may also combine Maxalt with phenergan and Tylenol as needed

## 2015-12-13 NOTE — Progress Notes (Signed)
PATIENT: [name] [name] DOB: [birth year]  Chief Complaint  Patient presents with  . Headache    She has been having daily headaches, that vary in severity, since delivering her son on 10/13/15.  She had a normal vaginal delivery with epidural anesthesia.  She has been using Tylenol and oxycodone for pain with her more severe headaches.  She did not want to use Fioricet due to the caffeine, since she is currently breastfeeding.  . Back Pain    She developed low back pain one week ago that is radiating into her left, outer thigh.       HISTORICAL  [name] [name] is a 20 years old right-handed female, seen in refer by her obstetrician Dr.Naima Dillard for evaluation of frequent headaches, low back pain in Dec 13 2015  She had normal vaginal delivery in November 02 2015 under epidural anesthesia  She complains of frequent headache since delivery, usually right retro-orbital area severe pounding headache with associated light noise sensitivity, lasting 4-5 hours, movements made her headache worse, slight resting was helpful, she has been taking Tylenol, occasionally oxycodone as needed which has been helpful,  She only had few migraine headaches in the past, now she is having migraines 3-4 times each month,  In addition, she complains of low back pain, her low back pain started at the end of her pregnancy, getting worse since March 2017, also had radiating pain to her left hip, left lateral thigh and grinding sensation when she moves her back  She had no persistent bilateral lower extremity paresthesia or weakness, no bowel and bladder incontinence.   REVIEW OF SYSTEMS: Full 14 system review of systems performed and notable only for as above  ALLERGIES: No Known Allergies  HOME MEDICATIONS: Current Outpatient Prescriptions  Medication Sig Dispense Refill  . acetaminophen (TYLENOL) 500 MG tablet Take 1,000 mg by mouth every 6 (six) hours as needed for headache.    .  butalbital-acetaminophen-caffeine (FIORICET) 50-325-40 MG tablet Take 1-2 tablets by mouth every 6 (six) hours as needed for headache or migraine. 45 tablet 2  . OxyCODONE HCl, Abuse Deter, (OXAYDO) 5 MG TABA Take 1 tablet by mouth every 4 (four) hours as needed. 36 tablet 0  . Prenatal Vit-Fe Fumarate-FA (PRENATAL MULTIVITAMIN) TABS tablet Take 1 tablet by mouth daily at 12 noon.     No current facility-administered medications for this visit.    PAST MEDICAL HISTORY: Past Medical History  Diagnosis Date  . Streptococcus B carrier or suspected carrier   . Medical history non-contributory   . Headache     PAST SURGICAL HISTORY: Past Surgical History  Procedure Laterality Date  . No past surgeries      FAMILY HISTORY: Family History  Problem Relation Age of Onset  . Healthy Mother   . Hypertension Father     SOCIAL HISTORY:  Social History   Social History  . Marital Status: Single    Spouse Name: N/A  . Number of Children: 1  . Years of Education: 2 yrs coll   Occupational History  . Property Manager    Social History Main Topics  . Smoking status: Former Smoker    Quit date: 10/25/2010  . Smokeless tobacco: Never Used  . Alcohol Use: Yes     Comment: socially  . Drug Use: No  . Sexual Activity: Yes    Birth Control/ Protection: None   Other Topics Concern  . Not on file   Social History Narrative  Lives at home with boyfriend and son.   Right-handed.   2-4 cups caffeine daily (unsweet tea).     PHYSICAL EXAM   Filed Vitals:   12/13/15 1148  BP: 131/87  Pulse: 81  Height: 5\' 7"  (1.702 m)  Weight: 255 lb 4 oz (115.781 kg)    Not recorded      Body mass index is 39.97 kg/(m^2).  PHYSICAL EXAMNIATION:  Gen: NAD, conversant, well nourised, obese, well groomed                     Cardiovascular: Regular rate rhythm, no peripheral edema, warm, nontender. Eyes: Conjunctivae clear without exudates or hemorrhage Neck: Supple, no carotid  bruise. Pulmonary: Clear to auscultation bilaterally   NEUROLOGICAL EXAM:  MENTAL STATUS: Speech:    Speech is normal; fluent and spontaneous with normal comprehension.  Cognition:     Orientation to time, place and person     Normal recent and remote memory     Normal Attention span and concentration     Normal Language, naming, repeating,spontaneous speech     Fund of knowledge   CRANIAL NERVES: CN II: Visual fields are full to confrontation. Fundoscopic exam is normal with sharp discs and no vascular changes. Pupils are round equal and briskly reactive to light. CN III, IV, VI: extraocular movement are normal. No ptosis. CN V: Facial sensation is intact to pinprick in all 3 divisions bilaterally. Corneal responses are intact.  CN VII: Face is symmetric with normal eye closure and smile. CN VIII: Hearing is normal to rubbing fingers CN IX, X: Palate elevates symmetrically. Phonation is normal. CN XI: Head turning and shoulder shrug are intact CN XII: Tongue is midline with normal movements and no atrophy.  MOTOR: There is no pronator drift of out-stretched arms. Muscle bulk and tone are normal. Muscle strength is normal.  REFLEXES: Reflexes are 2+ and symmetric at the biceps, triceps, knees, and ankles. Plantar responses are flexor.  SENSORY: Intact to light touch, pinprick, positional sensation and vibratory sensation are intact in fingers and toes.  COORDINATION: Rapid alternating movements and fine finger movements are intact. There is no dysmetria on finger-to-nose and heel-knee-shin.    GAIT/STANCE: Posture is normal. Gait is steady with normal steps, base, arm swing, and turning. Heel and toe walking are normal. Tandem gait is normal.  Romberg is absent.   DIAGNOSTIC DATA (LABS, IMAGING, TESTING) - I reviewed patient records, labs, notes, testing and imaging myself where available.   ASSESSMENT AND PLAN  [name] [name] is a 20 y.o. female   Chronic migraine  headaches  I have suggested magnesium oxide, riboflavin 100 mg twice a day as preventive medication  She is still breast-feeding  Fioricet as needed, Maxalt 5 mg for severe headache, discard breastmilk within 24 hours after medication.  Left low back pain, radiating pain to left lower extremity  There are no focal findings, suggestive of left lumbar radiculopathies  I have suggested her heart compression, as needed NSAIDs, back stretching exercise   Marcial Pacas, M.D. Ph.D.  [facility] [address], [place], [place] [address] Ph: [phone] Fax: [phone]  [id number] Dillard, MD

## 2016-02-13 ENCOUNTER — Ambulatory Visit: Payer: Medicaid Other | Admitting: Neurology

## 2016-02-13 ENCOUNTER — Telehealth: Payer: Self-pay | Admitting: *Deleted

## 2016-02-13 NOTE — Telephone Encounter (Signed)
No showed follow up appointment. 

## 2016-02-14 ENCOUNTER — Encounter: Payer: Self-pay | Admitting: Neurology

## 2016-03-24 ENCOUNTER — Encounter (HOSPITAL_COMMUNITY): Payer: Self-pay | Admitting: *Deleted

## 2016-03-24 ENCOUNTER — Ambulatory Visit (HOSPITAL_COMMUNITY)
Admission: EM | Admit: 2016-03-24 | Discharge: 2016-03-24 | Disposition: A | Discharge status: Home / Self Care | Payer: Medicaid Other | Attending: Internal Medicine | Admitting: Internal Medicine

## 2016-03-24 DIAGNOSIS — M545 Low back pain, unspecified: Secondary | ICD-10-CM

## 2016-03-24 MED ORDER — DICLOFENAC SODIUM 75 MG PO TBEC: 75.0000 mg | DELAYED_RELEASE_TABLET | Freq: Two times a day (BID) | ORAL | 1 refills | Status: DC

## 2016-03-24 NOTE — ED Triage Notes (Signed)
Pt reports  Low  Back  Pain  For  Several  Months   She  Reports  The  Pain  As  intermittant  At first     She  denys    Any     specefic     Injury  But  She  Does  However  Report     She  Lifts  Her  Baby  At  Home  A  Lot  She    Appears  In no  severe  Distress

## 2016-03-24 NOTE — ED Provider Notes (Signed)
CSN: [id number]     Arrival date & time 03/24/16  1826 History   None    Chief Complaint  Patient presents with  . Back Pain   (Consider location/radiation/quality/duration/timing/severity/associated sxs/prior Treatment)  20 yo female presents with left lower back pain x 2 months. She is 5 months post partum and started having back pain following delivery, but worse in the left 2 months. Pain is along the left spinal region. No radiation. No numbness, tingling or weakness is noted. No loss of bowel or bladder control. No trauma. No medications have been used. No longer breast feeding.      Past Medical History:  Diagnosis Date  . Headache   . Medical history non-contributory   . Streptococcus B carrier or suspected carrier    Past Surgical History:  Procedure Laterality Date  . NO PAST SURGERIES     Family History  Problem Relation Age of Onset  . Healthy Mother   . Hypertension Father    Social History  Substance Use Topics  . Smoking status: Former Smoker    Quit date: 10/25/2010  . Smokeless tobacco: Never Used  . Alcohol use Yes     Comment: socially   OB History    Gravida Para Term Preterm AB Living   1 1 1     1    SAB TAB Ectopic Multiple Live Births         0 1     Review of Systems  Constitutional: Negative for fever.  Genitourinary: Negative.   Musculoskeletal: Positive for back pain and myalgias. Negative for gait problem and joint swelling.  Skin: Negative.   Neurological: Negative for weakness and numbness.  Psychiatric/Behavioral: Negative.     Allergies  Review of patient's allergies indicates no known allergies.  Home Medications   Prior to Admission medications   Medication Sig Start Date End Date Taking? Authorizing Provider  acetaminophen (TYLENOL) 500 MG tablet Take 1,000 mg by mouth every 6 (six) hours as needed for headache.    Historical Provider, MD  butalbital-acetaminophen-caffeine (FIORICET) 50-325-40 MG tablet Take 1-2 tablets by  mouth every 6 (six) hours as needed for headache or migraine. 11/28/15   Gavin Pound, CNM  diclofenac (VOLTAREN) 75 MG EC tablet Take 1 tablet (75 mg total) by mouth 2 (two) times daily. 03/24/16   Bjorn Pippin, PA-C  OxyCODONE HCl, Abuse Deter, (OXAYDO) 5 MG TABA Take 1 tablet by mouth every 4 (four) hours as needed. 11/04/15   Donnel Saxon, CNM  Prenatal Vit-Fe Fumarate-FA (PRENATAL MULTIVITAMIN) TABS tablet Take 1 tablet by mouth daily at 12 noon.    Historical Provider, MD  promethazine (PHENERGAN) 25 MG tablet Take 1 tablet (25 mg total) by mouth every 6 (six) hours as needed for nausea or vomiting. 12/13/15   Marcial Pacas, MD  rizatriptan (MAXALT-MLT) 5 MG disintegrating tablet Take 1 tablet (5 mg total) by mouth as needed for migraine. May repeat in 2 hours if needed 12/13/15   Marcial Pacas, MD   Meds Ordered and Administered this Visit  Medications - No data to display  BP 129/86   Pulse 78   Temp 98.6 F (37 C) (Oral)   Resp 18   LMP 02/13/2016   SpO2 100%   Breastfeeding? No  No data found.   Physical Exam  Constitutional: She appears well-developed and well-nourished. No distress.  Pulmonary/Chest: Effort normal.  Musculoskeletal: She exhibits tenderness. She exhibits no edema or deformity.  Pain to palpation along the  left para spinal lumbar region. Full flexion and pain with extension. Negative SLR  Neurological: She is alert. She has normal reflexes. She displays normal reflexes. She exhibits normal muscle tone. Coordination normal.  Skin: Skin is warm and dry. No rash noted. She is not diaphoretic.  Psychiatric: Her behavior is normal.  Nursing note and vitals reviewed.   Urgent Care Course   Clinical Course    Procedures (including critical care time)  Labs Review Labs Reviewed - No data to display  Imaging Review No results found.   Visual Acuity Review  Right Eye Distance:   Left Eye Distance:   Bilateral Distance:    Right Eye Near:   Left Eye Near:     Bilateral Near:         MDM   1. Left-sided low back pain without sciatica    No indication for xrays today. Suspect facet inflammation. No nerve deficit. Treat with NSAID's, rest, gentle stretching and exercise. If worsens then f/u with Ortho.    Bjorn Pippin, PA-C 03/24/16 2133

## 2016-03-24 NOTE — Discharge Instructions (Signed)
You most likely have inflammation in the small joints of your spine called facets. Treat with diclofenac every 12 hours for 1-2 weeks then as needed. Please do your stretching exercises they will help you. F/U with Orthopedics if you do not improve

## 2016-08-10 ENCOUNTER — Encounter (HOSPITAL_COMMUNITY): Payer: Self-pay | Admitting: Nurse Practitioner

## 2016-08-10 ENCOUNTER — Emergency Department (HOSPITAL_COMMUNITY)
Admission: EM | Admit: 2016-08-10 | Discharge: 2016-08-10 | Disposition: A | Discharge status: Home / Self Care | Payer: Medicaid Other | Attending: Emergency Medicine | Admitting: Emergency Medicine

## 2016-08-10 DIAGNOSIS — R109 Unspecified abdominal pain: Secondary | ICD-10-CM | POA: Diagnosis present

## 2016-08-10 DIAGNOSIS — R1013 Epigastric pain: Secondary | ICD-10-CM | POA: Insufficient documentation

## 2016-08-10 DIAGNOSIS — Z87891 Personal history of nicotine dependence: Secondary | ICD-10-CM | POA: Insufficient documentation

## 2016-08-10 DIAGNOSIS — R112 Nausea with vomiting, unspecified: Secondary | ICD-10-CM | POA: Diagnosis not present

## 2016-08-10 LAB — I-STAT BETA HCG BLOOD, ED (MC, WL, AP ONLY): I-stat hCG, quantitative: <5 m[IU]/mL (ref <5)

## 2016-08-10 LAB — COMPREHENSIVE METABOLIC PANEL
ALT: 31 U/L (ref 14–54)
AST: 24 U/L (ref 15–41)
Albumin: 4.2 g/dL (ref 3.5–5.0)
Alkaline Phosphatase: 48 U/L (ref 38–126)
Anion gap: 10 (ref 5–15)
BUN: 12 mg/dL (ref 6–20)
CHLORIDE: 107 mmol/L (ref 101–111)
CO2: 21 mmol/L — ABNORMAL LOW (ref 22–32)
CREATININE: 0.69 mg/dL (ref 0.44–1.00)
Calcium: 9 mg/dL (ref 8.9–10.3)
GFR calc Af Amer: >60 mL/min (ref >60)
Glucose, Bld: 110 mg/dL — ABNORMAL HIGH (ref 65–99)
Potassium: 3.8 mmol/L (ref 3.5–5.1)
Sodium: 138 mmol/L (ref 135–145)
Total Bilirubin: 0.9 mg/dL (ref 0.3–1.2)
Total Protein: 7.6 g/dL (ref 6.5–8.1)

## 2016-08-10 LAB — URINALYSIS, ROUTINE W REFLEX MICROSCOPIC
Bilirubin Urine: NEGATIVE
Glucose, UA: NEGATIVE mg/dL
Hgb urine dipstick: NEGATIVE
Ketones, ur: NEGATIVE mg/dL
Nitrite: NEGATIVE
PH: 6 (ref 5.0–8.0)
Protein, ur: NEGATIVE mg/dL
SPECIFIC GRAVITY, URINE: 1.013 (ref 1.005–1.030)

## 2016-08-10 LAB — CBC
HCT: 43.6 % (ref 36.0–46.0)
Hemoglobin: 15.2 g/dL — ABNORMAL HIGH (ref 12.0–15.0)
MCH: 29.5 pg (ref 26.0–34.0)
MCHC: 34.9 g/dL (ref 30.0–36.0)
MCV: 84.5 fL (ref 78.0–100.0)
PLATELETS: 245 10*3/uL (ref 150–400)
RBC: 5.16 MIL/uL — ABNORMAL HIGH (ref 3.87–5.11)
RDW: 12.7 % (ref 11.5–15.5)
WBC: 8.1 10*3/uL (ref 4.0–10.5)

## 2016-08-10 LAB — LIPASE, BLOOD: LIPASE: 23 U/L (ref 11–51)

## 2016-08-10 MED ORDER — MORPHINE SULFATE (PF) 4 MG/ML IV SOLN: 4.0000 mg | Freq: Once | INTRAVENOUS | Status: DC

## 2016-08-10 MED ORDER — SODIUM CHLORIDE 0.9 % IV BOLUS (SEPSIS)
1000.0000 mL | Freq: Once | INTRAVENOUS | Status: AC
  Administered 2016-08-10: 1000 mL via INTRAVENOUS

## 2016-08-10 MED ORDER — KETOROLAC TROMETHAMINE 30 MG/ML IJ SOLN
30.0000 mg | Freq: Once | INTRAMUSCULAR | Status: AC
  Administered 2016-08-10: 30 mg via INTRAVENOUS
  Filled 2016-08-10: qty 1

## 2016-08-10 MED ORDER — HYDROCODONE-ACETAMINOPHEN 5-325 MG PO TABS: 1.0000 | ORAL_TABLET | ORAL | 0 refills | Status: DC | PRN

## 2016-08-10 MED ORDER — ONDANSETRON HCL 4 MG/2ML IJ SOLN
4.0000 mg | Freq: Once | INTRAMUSCULAR | Status: AC
  Administered 2016-08-10: 4 mg via INTRAVENOUS
  Filled 2016-08-10: qty 2

## 2016-08-10 MED ORDER — MORPHINE SULFATE (PF) 4 MG/ML IV SOLN
4.0000 mg | Freq: Once | INTRAVENOUS | Status: AC
  Administered 2016-08-10: 4 mg via INTRAVENOUS
  Filled 2016-08-10: qty 1

## 2016-08-10 MED ORDER — ONDANSETRON 4 MG PO TBDP: 4.0000 mg | ORAL_TABLET | Freq: Three times a day (TID) | ORAL | 0 refills | Status: DC | PRN

## 2016-08-10 MED ORDER — FAMOTIDINE IN NACL 20-0.9 MG/50ML-% IV SOLN
20.0000 mg | Freq: Once | INTRAVENOUS | Status: AC
  Administered 2016-08-10: 20 mg via INTRAVENOUS
  Filled 2016-08-10: qty 50

## 2016-08-10 MED ORDER — FAMOTIDINE 20 MG PO TABS: 20.0000 mg | ORAL_TABLET | Freq: Two times a day (BID) | ORAL | 0 refills | Status: DC

## 2016-08-10 NOTE — ED Provider Notes (Signed)
[place] [facility] DEPT Provider Note   CSN: [id number] Arrival date & time: 08/10/16  1221     History   Chief Complaint Chief Complaint  Patient presents with  . Abdominal Pain    HPI [name] [name] is a 21 y.o. female.  Pt presents to the ED today with abdominal pain that started last night.  The pt also started with n/v as well.  Pt said that sx have not improved today, so she came in.      Past Medical History:  Diagnosis Date  . Headache   . Medical history non-contributory   . Streptococcus B carrier or suspected carrier     Patient Active Problem List   Diagnosis Date Noted  . Chronic migraine 12/13/2015  . Low back pain 12/13/2015  . Normal vaginal delivery 11/02/2015  . Obesity (BMI 35.0-39.9 without comorbidity) 11/01/2015  . Abnormal fetal heart rate or rhythm (FHR) 11/01/2015  . Post term pregnancy over 40 weeks 11/01/2015    Past Surgical History:  Procedure Laterality Date  . NO PAST SURGERIES      OB History    Gravida Para Term Preterm AB Living   1 1 1     1    SAB TAB Ectopic Multiple Live Births         0 1       Home Medications    Prior to Admission medications   Medication Sig Start Date End Date Taking? Authorizing Provider  acetaminophen (TYLENOL) 500 MG tablet Take 1,000 mg by mouth every 6 (six) hours as needed for headache.   Yes Historical Provider, MD  butalbital-acetaminophen-caffeine (FIORICET) 50-325-40 MG tablet Take 1-2 tablets by mouth every 6 (six) hours as needed for headache or migraine. Patient not taking: Reported on 08/10/2016 11/28/15   Gavin Pound, CNM  diclofenac (VOLTAREN) 75 MG EC tablet Take 1 tablet (75 mg total) by mouth 2 (two) times daily. Patient not taking: Reported on 08/10/2016 03/24/16   Bjorn Pippin, PA-C  famotidine (PEPCID) 20 MG tablet Take 1 tablet (20 mg total) by mouth 2 (two) times daily. 08/10/16   Isla Pence, MD  HYDROcodone-acetaminophen (NORCO/VICODIN) 5-325 MG tablet Take 1 tablet by  mouth every 4 (four) hours as needed. 08/10/16   Isla Pence, MD  ondansetron (ZOFRAN ODT) 4 MG disintegrating tablet Take 1 tablet (4 mg total) by mouth every 8 (eight) hours as needed for nausea or vomiting. 08/10/16   Isla Pence, MD  OxyCODONE HCl, Abuse Deter, (OXAYDO) 5 MG TABA Take 1 tablet by mouth every 4 (four) hours as needed. Patient not taking: Reported on 08/10/2016 11/04/15   Donnel Saxon, CNM  promethazine (PHENERGAN) 25 MG tablet Take 1 tablet (25 mg total) by mouth every 6 (six) hours as needed for nausea or vomiting. Patient not taking: Reported on 08/10/2016 12/13/15   Marcial Pacas, MD  rizatriptan (MAXALT-MLT) 5 MG disintegrating tablet Take 1 tablet (5 mg total) by mouth as needed for migraine. May repeat in 2 hours if needed Patient not taking: Reported on 08/10/2016 12/13/15   Marcial Pacas, MD    Family History Family History  Problem Relation Age of Onset  . Healthy Mother   . Hypertension Father     Social History Social History  Substance Use Topics  . Smoking status: Former Smoker    Quit date: 10/25/2010  . Smokeless tobacco: Never Used  . Alcohol use Yes     Comment: socially     Allergies   Patient  has no known allergies.   Review of Systems Review of Systems  Gastrointestinal: Positive for abdominal pain, nausea and vomiting.  All other systems reviewed and are negative.    Physical Exam Updated Vital Signs BP 125/61   Pulse 116   Temp 97.9 F (36.6 C) (Oral)   Resp 19   LMP 07/12/2016   SpO2 96%   Physical Exam  Constitutional: She is oriented to person, place, and time. She appears well-developed and well-nourished.  HENT:  Head: Normocephalic and atraumatic.  Right Ear: External ear normal.  Left Ear: External ear normal.  Nose: Nose normal.  Mouth/Throat: Oropharynx is clear and moist.  Eyes: Conjunctivae and EOM are normal. Pupils are equal, round, and reactive to light.  Neck: Normal range of motion. Neck supple.  Cardiovascular:  Regular rhythm, normal heart sounds and intact distal pulses.  Tachycardia present.   Pulmonary/Chest: Effort normal and breath sounds normal.  Abdominal: Soft. Bowel sounds are normal. There is tenderness in the epigastric area.  Musculoskeletal: Normal range of motion.  Neurological: She is alert and oriented to person, place, and time.  Psychiatric: She has a normal mood and affect. Her behavior is normal. Judgment and thought content normal.  Nursing note and vitals reviewed.    ED Treatments / Results  Labs (all labs ordered are listed, but only abnormal results are displayed) Labs Reviewed  COMPREHENSIVE METABOLIC PANEL - Abnormal; Notable for the following:       Result Value   CO2 21 (*)    Glucose, Bld 110 (*)    All other components within normal limits  CBC - Abnormal; Notable for the following:    RBC 5.16 (*)    Hemoglobin 15.2 (*)    All other components within normal limits  URINALYSIS, ROUTINE W REFLEX MICROSCOPIC - Abnormal; Notable for the following:    Leukocytes, UA TRACE (*)    Bacteria, UA RARE (*)    Squamous Epithelial / LPF 6-30 (*)    All other components within normal limits  LIPASE, BLOOD  I-STAT BETA HCG BLOOD, ED ([facility], [facility], [facility] ONLY)    EKG  EKG Interpretation None       Radiology No results found.  Procedures Procedures (including critical care time)  Medications Ordered in ED Medications  morphine 4 MG/ML injection 4 mg (not administered)  sodium chloride 0.9 % bolus 1,000 mL (0 mLs Intravenous Stopped 08/10/16 1418)  morphine 4 MG/ML injection 4 mg (4 mg Intravenous Given 08/10/16 1304)  ondansetron (ZOFRAN) injection 4 mg (4 mg Intravenous Given 08/10/16 1304)  famotidine (PEPCID) IVPB 20 mg premix (0 mg Intravenous Stopped 08/10/16 1418)     Initial Impression / Assessment and Plan / ED Course  I have reviewed the triage vital signs and the nursing notes.  Pertinent labs & imaging results that were available during my care of the  patient were reviewed by me and considered in my medical decision making (see chart for details).  Clinical Course    Pt has improved, but she still has some pain.  An additional morphine was ordered.  The pt will be d/c'd home with lortab (#10), zofran odt, and pepcid.    She is given the number of gi to f/u if sx continue.  She knows to return if worse.  Final Clinical Impressions(s) / ED Diagnoses   Final diagnoses:  Epigastric pain    New Prescriptions New Prescriptions   FAMOTIDINE (PEPCID) 20 MG TABLET    Take 1  tablet (20 mg total) by mouth 2 (two) times daily.   HYDROCODONE-ACETAMINOPHEN (NORCO/VICODIN) 5-325 MG TABLET    Take 1 tablet by mouth every 4 (four) hours as needed.   ONDANSETRON (ZOFRAN ODT) 4 MG DISINTEGRATING TABLET    Take 1 tablet (4 mg total) by mouth every 8 (eight) hours as needed for nausea or vomiting.     Isla Pence, MD 08/10/16 1447

## 2016-08-10 NOTE — ED Notes (Signed)
Patient came to me and stated "I peed but I dropped the cup in the toilet"; handed patient a glove to retrieve cup out of the toilet; patient will attempt later for she cannot go again right now at this time; Junie Panning, RN aware

## 2016-08-10 NOTE — ED Triage Notes (Signed)
Pt presents with c/o abd pain. The pain began last night. The pain has been progressively worse since onset. She reports nausea, vomiting. She denies fevers, chills, dizziness, urinary symptoms, constipation, diarrhea. She has been able to tolerate little oral intake. She took tylenol with no relief.

## 2016-08-10 NOTE — ED Notes (Signed)
Patient aware of need of urine specimen; patient is ambulatory to the bathroom at this time to attempt

## 2016-08-13 ENCOUNTER — Other Ambulatory Visit: Payer: Self-pay | Admitting: Obstetrics and Gynecology

## 2016-08-13 DIAGNOSIS — D242 Benign neoplasm of left breast: Secondary | ICD-10-CM | POA: Insufficient documentation

## 2016-08-13 DIAGNOSIS — N63 Unspecified lump in unspecified breast: Secondary | ICD-10-CM

## 2016-08-16 ENCOUNTER — Emergency Department (HOSPITAL_COMMUNITY): Payer: Medicaid Other

## 2016-08-16 ENCOUNTER — Emergency Department (HOSPITAL_COMMUNITY)
Admission: EM | Admit: 2016-08-16 | Discharge: 2016-08-17 | Disposition: A | Discharge status: Home / Self Care | Payer: Medicaid Other | Attending: Emergency Medicine | Admitting: Emergency Medicine

## 2016-08-16 ENCOUNTER — Encounter (HOSPITAL_COMMUNITY): Payer: Self-pay

## 2016-08-16 DIAGNOSIS — J40 Bronchitis, not specified as acute or chronic: Secondary | ICD-10-CM | POA: Insufficient documentation

## 2016-08-16 DIAGNOSIS — Z87891 Personal history of nicotine dependence: Secondary | ICD-10-CM | POA: Insufficient documentation

## 2016-08-16 DIAGNOSIS — Z79899 Other long term (current) drug therapy: Secondary | ICD-10-CM | POA: Insufficient documentation

## 2016-08-16 DIAGNOSIS — R05 Cough: Secondary | ICD-10-CM | POA: Diagnosis present

## 2016-08-16 LAB — CBC WITH DIFFERENTIAL/PLATELET
BASOS ABS: 0 10*3/uL (ref 0.0–0.1)
BASOS PCT: 0 %
EOS PCT: 1 %
Eosinophils Absolute: 0 10*3/uL (ref 0.0–0.7)
HCT: 41.7 % (ref 36.0–46.0)
Hemoglobin: 14.2 g/dL (ref 12.0–15.0)
LYMPHS PCT: 36 %
Lymphs Abs: 2.1 10*3/uL (ref 0.7–4.0)
MCH: 29 pg (ref 26.0–34.0)
MCHC: 34.1 g/dL (ref 30.0–36.0)
MCV: 85.1 fL (ref 78.0–100.0)
Monocytes Absolute: 0.6 10*3/uL (ref 0.1–1.0)
Monocytes Relative: 11 %
Neutro Abs: 3 10*3/uL (ref 1.7–7.7)
Neutrophils Relative %: 52 %
Platelets: 303 10*3/uL (ref 150–400)
RBC: 4.9 MIL/uL (ref 3.87–5.11)
RDW: 12.7 % (ref 11.5–15.5)
WBC: 5.7 10*3/uL (ref 4.0–10.5)

## 2016-08-16 LAB — I-STAT CG4 LACTIC ACID, ED: Lactic Acid, Venous: 0.94 mmol/L (ref 0.5–1.9)

## 2016-08-16 LAB — COMPREHENSIVE METABOLIC PANEL
ALK PHOS: 46 U/L (ref 38–126)
ALT: 39 U/L (ref 14–54)
AST: 24 U/L (ref 15–41)
Albumin: 4.4 g/dL (ref 3.5–5.0)
Anion gap: 8 (ref 5–15)
BILIRUBIN TOTAL: 0.5 mg/dL (ref 0.3–1.2)
BUN: 6 mg/dL (ref 6–20)
CALCIUM: 9.5 mg/dL (ref 8.9–10.3)
CO2: 23 mmol/L (ref 22–32)
Chloride: 105 mmol/L (ref 101–111)
Creatinine, Ser: 0.85 mg/dL (ref 0.44–1.00)
GFR calc Af Amer: >60 mL/min (ref >60)
GFR calc non Af Amer: >60 mL/min (ref >60)
GLUCOSE: 95 mg/dL (ref 65–99)
POTASSIUM: 3.6 mmol/L (ref 3.5–5.1)
SODIUM: 136 mmol/L (ref 135–145)
Total Protein: 7.9 g/dL (ref 6.5–8.1)

## 2016-08-16 LAB — RAPID STREP SCREEN (MED CTR MEBANE ONLY): Streptococcus, Group A Screen (Direct): NEGATIVE

## 2016-08-16 MED ORDER — AZITHROMYCIN 250 MG PO TABS: 250.0000 mg | ORAL_TABLET | Freq: Every day | ORAL | 0 refills | Status: DC

## 2016-08-16 MED ORDER — ALBUTEROL SULFATE HFA 108 (90 BASE) MCG/ACT IN AERS
2.0000 | INHALATION_SPRAY | RESPIRATORY_TRACT | Status: DC
  Administered 2016-08-16: 2 via RESPIRATORY_TRACT
  Filled 2016-08-16: qty 6.7

## 2016-08-16 NOTE — ED Notes (Signed)
Kim RN made aware of elevated HR

## 2016-08-16 NOTE — ED Triage Notes (Signed)
Per Pt, Pt is coming from home with complaints of cough and generalized body aches since Monday. Pt reports having a GI bug last week and then starting to have a painful cough this week. Pt reports some lightheadedness.

## 2016-08-16 NOTE — ED Provider Notes (Signed)
[facility] DEPT Provider Note   CSN: [id number] Arrival date & time: 08/16/16  1654  By signing my name below, I, [name] [name], attest that this documentation has been prepared under the direction and in the presence of [name], MD  Electronically Signed: Delton [name], ED Scribe. 08/16/16. 11:28 PM.   History   Chief Complaint Chief Complaint  Patient presents with  . Cough  . Generalized Body Aches   The history is provided by the patient. No language interpreter was used.   HPI Comments:  [name] [name] is a 21 y.o. female who presents to the Emergency Department complaining of a sudden onset, persistent, productive cough x 3 days. Pt also reports a sore throat, chest congestion, lightheadedness and a low grade fever x today. No alleviating factors noted. Pt denies nausea, vomiting, diarrhea and a hx of asthma. No other associated symptoms noted.   Past Medical History:  Diagnosis Date  . Headache   . Medical history non-contributory   . Streptococcus B carrier or suspected carrier     Patient Active Problem List   Diagnosis Date Noted  . Chronic migraine 12/13/2015  . Low back pain 12/13/2015  . Normal vaginal delivery 11/02/2015  . Obesity (BMI 35.0-39.9 without comorbidity) 11/01/2015  . Abnormal fetal heart rate or rhythm (FHR) 11/01/2015  . Post term pregnancy over 40 weeks 11/01/2015    Past Surgical History:  Procedure Laterality Date  . NO PAST SURGERIES      OB History    Gravida Para Term Preterm AB Living   1 1 1     1    SAB TAB Ectopic Multiple Live Births         0 1       Home Medications    Prior to Admission medications   Medication Sig Start Date End Date Taking? Authorizing Provider  acetaminophen (TYLENOL) 500 MG tablet Take 1,000 mg by mouth every 6 (six) hours as needed for headache.    Historical Provider, MD  butalbital-acetaminophen-caffeine (FIORICET) 50-325-40 MG tablet Take 1-2 tablets by mouth every 6 (six) hours as  needed for headache or migraine. Patient not taking: Reported on 08/10/2016 11/28/15   Gavin Pound, CNM  diclofenac (VOLTAREN) 75 MG EC tablet Take 1 tablet (75 mg total) by mouth 2 (two) times daily. Patient not taking: Reported on 08/10/2016 03/24/16   Bjorn Pippin, PA-C  famotidine (PEPCID) 20 MG tablet Take 1 tablet (20 mg total) by mouth 2 (two) times daily. 08/10/16   Isla Pence, MD  HYDROcodone-acetaminophen (NORCO/VICODIN) 5-325 MG tablet Take 1 tablet by mouth every 4 (four) hours as needed. 08/10/16   Isla Pence, MD  ondansetron (ZOFRAN ODT) 4 MG disintegrating tablet Take 1 tablet (4 mg total) by mouth every 8 (eight) hours as needed for nausea or vomiting. 08/10/16   Isla Pence, MD  OxyCODONE HCl, Abuse Deter, (OXAYDO) 5 MG TABA Take 1 tablet by mouth every 4 (four) hours as needed. Patient not taking: Reported on 08/10/2016 11/04/15   Donnel Saxon, CNM  promethazine (PHENERGAN) 25 MG tablet Take 1 tablet (25 mg total) by mouth every 6 (six) hours as needed for nausea or vomiting. Patient not taking: Reported on 08/10/2016 12/13/15   Marcial Pacas, MD  rizatriptan (MAXALT-MLT) 5 MG disintegrating tablet Take 1 tablet (5 mg total) by mouth as needed for migraine. May repeat in 2 hours if needed Patient not taking: Reported on 08/10/2016 12/13/15   Marcial Pacas, MD    Family  History Family History  Problem Relation Age of Onset  . Healthy Mother   . Hypertension Father     Social History Social History  Substance Use Topics  . Smoking status: Former Smoker    Quit date: 10/25/2010  . Smokeless tobacco: Never Used  . Alcohol use Yes     Comment: socially     Allergies   Patient has no known allergies.   Review of Systems Review of Systems 10 systems reviewed and all are negative for acute change except as noted in the HPI.  Physical Exam Updated Vital Signs BP 124/72 (BP Location: Left Arm)   Pulse (!) 136   Temp 99.4 F (37.4 C) (Oral)   Resp 18   Ht 5\' 7"  (1.702 m)   Wt  250 lb (113.4 kg)   LMP 07/12/2016   SpO2 97%   BMI 39.16 kg/m   Physical Exam  Constitutional: She is oriented to person, place, and time. She appears well-developed and well-nourished. No distress.  HENT:  Head: Normocephalic and atraumatic.  Mouth/Throat: Uvula is midline and oropharynx is clear and moist. No oropharyngeal exudate, posterior oropharyngeal edema or posterior oropharyngeal erythema.  Eyes: EOM are normal.  Neck: Normal range of motion.  Cardiovascular: Normal rate, regular rhythm and normal heart sounds.   Pulmonary/Chest: Effort normal and breath sounds normal.  Abdominal: Soft. She exhibits no distension. There is no tenderness.  Musculoskeletal: Normal range of motion.  Neurological: She is alert and oriented to person, place, and time.  Skin: Skin is warm and dry.  Psychiatric: She has a normal mood and affect. Judgment normal.  Nursing note and vitals reviewed.    ED Treatments / Results  DIAGNOSTIC STUDIES:  Oxygen Saturation is 97% on RA, normal by my interpretation.    COORDINATION OF CARE:  11:28 PM Discussed treatment plan with pt at bedside and pt agreed to plan.  Labs (all labs ordered are listed, but only abnormal results are displayed) Labs Reviewed  RAPID STREP SCREEN (NOT AT [facility])  CULTURE, GROUP A STREP ([name])  COMPREHENSIVE METABOLIC PANEL  CBC WITH DIFFERENTIAL/PLATELET  I-STAT CG4 LACTIC ACID, ED  I-STAT CG4 LACTIC ACID, ED    EKG  EKG Interpretation None       Radiology Dg Chest 2 View  Result Date: 08/16/2016 CLINICAL DATA:  Cough and flu-like symptoms. EXAM: CHEST  2 VIEW COMPARISON:  None. FINDINGS: The heart size and mediastinal contours are within normal limits. There is no focal infiltrate, pulmonary edema, or pleural effusion. There is kyphosis of spine. IMPRESSION: No active cardiopulmonary disease. Electronically Signed   By: Abelardo Diesel M.D.   On: 08/16/2016 18:30    Procedures Procedures (including critical  care time)  Medications Ordered in ED Medications  ibuprofen (ADVIL,MOTRIN) tablet 600 mg (600 mg Oral Given 08/17/16 0016)  acetaminophen (TYLENOL) tablet 650 mg (650 mg Oral Given 08/17/16 0016)  sodium chloride 0.9 % bolus 1,000 mL (0 mLs Intravenous Stopped 08/17/16 0130)     Initial Impression / Assessment and Plan / ED Course  I have reviewed the triage vital signs and the nursing notes.  Pertinent labs & imaging results that were available during my care of the patient were reviewed by me and considered in my medical decision making (see chart for details).  Clinical Course     Patient feels much better at this time.  Final Clinical Impressions(s) / ED Diagnoses   Final diagnoses:  None    New Prescriptions New Prescriptions  No medications on file   I personally performed the services described in this documentation, which was scribed in my presence. The recorded information has been reviewed and is accurate.        [name], MD 08/20/16 [phone]

## 2016-08-17 MED ORDER — ACETAMINOPHEN 325 MG PO TABS
650.0000 mg | ORAL_TABLET | Freq: Once | ORAL | Status: AC
Start: 2016-08-17 — End: 2016-08-17
  Administered 2016-08-17: 650 mg via ORAL
  Filled 2016-08-17: qty 2

## 2016-08-17 MED ORDER — IBUPROFEN 400 MG PO TABS
600.0000 mg | ORAL_TABLET | Freq: Once | ORAL | Status: AC
  Administered 2016-08-17: 600 mg via ORAL
  Filled 2016-08-17: qty 1

## 2016-08-17 MED ORDER — SODIUM CHLORIDE 0.9 % IV BOLUS (SEPSIS)
1000.0000 mL | Freq: Once | INTRAVENOUS | Status: AC
Start: 2016-08-17 — End: 2016-08-17
  Administered 2016-08-17: 1000 mL via INTRAVENOUS

## 2016-08-17 NOTE — ED Notes (Signed)
Dr. Venora Maples aware of pt VS. Confirmed pt appropriate for d/c.

## 2016-08-17 NOTE — ED Notes (Signed)
MD aware of pt heart rate. Per Dr. Venora Maples, will order fluids.

## 2016-08-20 LAB — CULTURE, GROUP A STREP (THRC)

## 2016-08-29 ENCOUNTER — Ambulatory Visit
Admission: RE | Admit: 2016-08-29 | Discharge: 2016-08-29 | Disposition: A | Discharge status: Home / Self Care | Payer: No Typology Code available for payment source | Source: Ambulatory Visit | Attending: Obstetrics and Gynecology | Admitting: Obstetrics and Gynecology

## 2016-08-29 DIAGNOSIS — N63 Unspecified lump in unspecified breast: Secondary | ICD-10-CM

## 2016-10-03 ENCOUNTER — Other Ambulatory Visit: Payer: Self-pay | Admitting: Obstetrics and Gynecology

## 2016-10-03 DIAGNOSIS — N63 Unspecified lump in unspecified breast: Secondary | ICD-10-CM

## 2016-10-26 ENCOUNTER — Ambulatory Visit
Admission: RE | Admit: 2016-10-26 | Discharge: 2016-10-26 | Disposition: A | Discharge status: Home / Self Care | Payer: No Typology Code available for payment source | Source: Ambulatory Visit | Attending: Obstetrics and Gynecology | Admitting: Obstetrics and Gynecology

## 2016-10-26 DIAGNOSIS — N63 Unspecified lump in unspecified breast: Secondary | ICD-10-CM

## 2016-11-03 ENCOUNTER — Encounter (HOSPITAL_COMMUNITY): Payer: Self-pay | Admitting: Emergency Medicine

## 2016-11-03 ENCOUNTER — Emergency Department (HOSPITAL_COMMUNITY)
Admission: EM | Admit: 2016-11-03 | Discharge: 2016-11-03 | Disposition: A | Discharge status: Home / Self Care | Payer: Medicaid Other | Attending: Emergency Medicine | Admitting: Emergency Medicine

## 2016-11-03 DIAGNOSIS — Z87891 Personal history of nicotine dependence: Secondary | ICD-10-CM | POA: Insufficient documentation

## 2016-11-03 DIAGNOSIS — K529 Noninfective gastroenteritis and colitis, unspecified: Secondary | ICD-10-CM | POA: Diagnosis not present

## 2016-11-03 DIAGNOSIS — R112 Nausea with vomiting, unspecified: Secondary | ICD-10-CM | POA: Diagnosis present

## 2016-11-03 LAB — COMPREHENSIVE METABOLIC PANEL
ALBUMIN: 4.4 g/dL (ref 3.5–5.0)
ALK PHOS: 51 U/L (ref 38–126)
ALT: 33 U/L (ref 14–54)
AST: 22 U/L (ref 15–41)
Anion gap: 9 (ref 5–15)
BUN: 15 mg/dL (ref 6–20)
CALCIUM: 9.2 mg/dL (ref 8.9–10.3)
CHLORIDE: 105 mmol/L (ref 101–111)
CO2: 22 mmol/L (ref 22–32)
CREATININE: 0.79 mg/dL (ref 0.44–1.00)
GFR calc Af Amer: >60 mL/min (ref >60)
GFR calc non Af Amer: >60 mL/min (ref >60)
GLUCOSE: 110 mg/dL — AB (ref 65–99)
Potassium: 4.4 mmol/L (ref 3.5–5.1)
SODIUM: 136 mmol/L (ref 135–145)
Total Bilirubin: 1.1 mg/dL (ref 0.3–1.2)
Total Protein: 7.1 g/dL (ref 6.5–8.1)

## 2016-11-03 LAB — CBC
HCT: 42.8 % (ref 36.0–46.0)
HEMOGLOBIN: 13.6 g/dL (ref 12.0–15.0)
MCH: 27 pg (ref 26.0–34.0)
MCHC: 31.8 g/dL (ref 30.0–36.0)
MCV: 84.9 fL (ref 78.0–100.0)
PLATELETS: 298 10*3/uL (ref 150–400)
RBC: 5.04 MIL/uL (ref 3.87–5.11)
RDW: 13.5 % (ref 11.5–15.5)
WBC: 8.2 10*3/uL (ref 4.0–10.5)

## 2016-11-03 LAB — URINALYSIS, ROUTINE W REFLEX MICROSCOPIC
BILIRUBIN URINE: NEGATIVE
GLUCOSE, UA: NEGATIVE mg/dL
HGB URINE DIPSTICK: NEGATIVE
Ketones, ur: NEGATIVE mg/dL
Leukocytes, UA: NEGATIVE
Nitrite: NEGATIVE
Protein, ur: NEGATIVE mg/dL
SPECIFIC GRAVITY, URINE: 1.031 — AB (ref 1.005–1.030)
pH: 5 (ref 5.0–8.0)

## 2016-11-03 LAB — I-STAT BETA HCG BLOOD, ED (MC, WL, AP ONLY)

## 2016-11-03 LAB — LIPASE, BLOOD: LIPASE: 21 U/L (ref 11–51)

## 2016-11-03 MED ORDER — ONDANSETRON HCL 4 MG/2ML IJ SOLN
4.0000 mg | Freq: Once | INTRAMUSCULAR | Status: AC
  Administered 2016-11-03: 4 mg via INTRAVENOUS
  Filled 2016-11-03: qty 2

## 2016-11-03 MED ORDER — PROMETHAZINE HCL 25 MG/ML IJ SOLN
12.5000 mg | Freq: Once | INTRAMUSCULAR | Status: AC
  Administered 2016-11-03: 12.5 mg via INTRAVENOUS
  Filled 2016-11-03: qty 1

## 2016-11-03 MED ORDER — SODIUM CHLORIDE 0.9 % IV BOLUS (SEPSIS)
2000.0000 mL | Freq: Once | INTRAVENOUS | Status: AC
  Administered 2016-11-03: 2000 mL via INTRAVENOUS

## 2016-11-03 MED ORDER — DICYCLOMINE HCL 20 MG PO TABS: 20.0000 mg | ORAL_TABLET | Freq: Two times a day (BID) | ORAL | 0 refills | Status: DC

## 2016-11-03 MED ORDER — ACETAMINOPHEN 325 MG PO TABS
650.0000 mg | ORAL_TABLET | Freq: Once | ORAL | Status: AC
  Administered 2016-11-03: 650 mg via ORAL
  Filled 2016-11-03: qty 2

## 2016-11-03 MED ORDER — ONDANSETRON 8 MG PO TBDP: 8.0000 mg | ORAL_TABLET | Freq: Three times a day (TID) | ORAL | 0 refills | Status: DC | PRN

## 2016-11-03 NOTE — Discharge Instructions (Signed)
Follow Up with a primary care doctor, take the medications as needed for the cramping and vomiting

## 2016-11-03 NOTE — ED Triage Notes (Signed)
Pt c/o abdominal pain and watery stools onset today. Pt reports having 4-5 episodes of watery stools. Pt also reports nausea.

## 2016-11-03 NOTE — ED Provider Notes (Signed)
[facility] DEPT Provider Note   CSN: [id number] Arrival date & time: 11/03/16  1859     History   Chief Complaint Chief Complaint  Patient presents with  . Abdominal Pain  . Diarrhea  . Nausea    HPI [name] [name] is a 21 y.o. female.  HPI Patient presents to the emergency room with complaints of abdominal pain, diarrhea, nausea and vomiting. Patient states symptoms started this morning. She started with several episodes of loose watery stools. That has continued throughout the day. She's had multiple episodes. She is also had nausea and a couple episodes of vomiting. She denies any blood in her stool. No fevers. She denies any recent trips or travel. She has not been on antibiotics recently. Past Medical History:  Diagnosis Date  . Headache   . Medical history non-contributory   . Streptococcus B carrier or suspected carrier     Patient Active Problem List   Diagnosis Date Noted  . Chronic migraine 12/13/2015  . Low back pain 12/13/2015  . Normal vaginal delivery 11/02/2015  . Obesity (BMI 35.0-39.9 without comorbidity) 11/01/2015  . Abnormal fetal heart rate or rhythm (FHR) 11/01/2015  . Post term pregnancy over 40 weeks 11/01/2015    Past Surgical History:  Procedure Laterality Date  . NO PAST SURGERIES      OB History    Gravida Para Term Preterm AB Living   1 1 1     1    SAB TAB Ectopic Multiple Live Births         0 1       Home Medications    Prior to Admission medications   Medication Sig Start Date End Date Taking? Authorizing Provider  acetaminophen (TYLENOL) 500 MG tablet Take 1,000 mg by mouth every 6 (six) hours as needed for headache.   Yes Historical Provider, MD  Calcium Carbonate Antacid (TUMS PO) Take 2 tablets by mouth once.   Yes Historical Provider, MD  Dextrose-Fructose-Sod Citrate (NAUZENE PO) Take 2 tablets by mouth once.   Yes Historical Provider, MD  butalbital-acetaminophen-caffeine (FIORICET) 50-325-40 MG tablet Take 1-2  tablets by mouth every 6 (six) hours as needed for headache or migraine. Patient not taking: Reported on 11/03/2016 11/28/15   Gavin Pound, CNM  diclofenac (VOLTAREN) 75 MG EC tablet Take 1 tablet (75 mg total) by mouth 2 (two) times daily. Patient not taking: Reported on 08/17/2016 03/24/16   Bjorn Pippin, PA-C  dicyclomine (BENTYL) 20 MG tablet Take 1 tablet (20 mg total) by mouth 2 (two) times daily. 11/03/16   Dorie Rank, MD  famotidine (PEPCID) 20 MG tablet Take 1 tablet (20 mg total) by mouth 2 (two) times daily. Patient not taking: Reported on 11/03/2016 08/10/16   Isla Pence, MD  HYDROcodone-acetaminophen (NORCO/VICODIN) 5-325 MG tablet Take 1 tablet by mouth every 4 (four) hours as needed. Patient not taking: Reported on 11/03/2016 08/10/16   Isla Pence, MD  ondansetron [facility] ODT) 8 MG disintegrating tablet Take 1 tablet (8 mg total) by mouth every 8 (eight) hours as needed for nausea or vomiting. 11/03/16   Dorie Rank, MD  OxyCODONE HCl, Abuse Deter, (OXAYDO) 5 MG TABA Take 1 tablet by mouth every 4 (four) hours as needed. Patient not taking: Reported on 08/17/2016 11/04/15   Donnel Saxon, CNM  promethazine (PHENERGAN) 25 MG tablet Take 1 tablet (25 mg total) by mouth every 6 (six) hours as needed for nausea or vomiting. Patient not taking: Reported on 08/17/2016 12/13/15  Marcial Pacas, MD  rizatriptan (MAXALT-MLT) 5 MG disintegrating tablet Take 1 tablet (5 mg total) by mouth as needed for migraine. May repeat in 2 hours if needed Patient not taking: Reported on 08/17/2016 12/13/15   Marcial Pacas, MD    Family History Family History  Problem Relation Age of Onset  . Healthy Mother   . Hypertension Father     Social History Social History  Substance Use Topics  . Smoking status: Former Smoker    Quit date: 10/25/2010  . Smokeless tobacco: Never Used  . Alcohol use Yes     Comment: socially     Allergies   Patient has no known allergies.   Review of Systems Review of Systems    All other systems reviewed and are negative.    Physical Exam Updated Vital Signs BP (!) 109/51   Pulse 83   Temp 98 F (36.7 C) (Oral)   Resp (!) 21   Ht 5\' 8"  (1.727 m)   Wt 117.9 kg   LMP 10/26/2016   SpO2 98%   Breastfeeding? No   BMI 39.53 kg/m   Physical Exam  Constitutional: She appears well-developed and well-nourished. No distress.  HENT:  Head: Normocephalic and atraumatic.  Right Ear: External ear normal.  Left Ear: External ear normal.  Eyes: Conjunctivae are normal. Right eye exhibits no discharge. Left eye exhibits no discharge. No scleral icterus.  Neck: Neck supple. No tracheal deviation present. No thyromegaly present.  Cardiovascular: Regular rhythm and intact distal pulses.  Tachycardia present.   Pulmonary/Chest: Effort normal and breath sounds normal. No stridor. No respiratory distress. She has no wheezes. She has no rales.  Abdominal: Soft. Bowel sounds are normal. She exhibits no distension. There is no tenderness. There is no rebound and no guarding.  Musculoskeletal: She exhibits no edema or tenderness.  Neurological: She is alert. She has normal strength. No cranial nerve deficit (no facial droop, extraocular movements intact, no slurred speech) or sensory deficit. She exhibits normal muscle tone. She displays no seizure activity. Coordination normal.  Skin: Skin is warm and dry. No rash noted.  Psychiatric: She has a normal mood and affect.  Nursing note and vitals reviewed.    ED Treatments / Results  Labs (all labs ordered are listed, but only abnormal results are displayed) Labs Reviewed  COMPREHENSIVE METABOLIC PANEL - Abnormal; Notable for the following:       Result Value   Glucose, Bld 110 (*)    All other components within normal limits  URINALYSIS, ROUTINE W REFLEX MICROSCOPIC - Abnormal; Notable for the following:    APPearance HAZY (*)    Specific Gravity, Urine 1.031 (*)    All other components within normal limits  LIPASE,  BLOOD  CBC  I-STAT BETA HCG BLOOD, ED ([facility], [facility], [facility] ONLY)    Radiology No results found.  Procedures Procedures (including critical care time)  Medications Ordered in ED Medications  sodium chloride 0.9 % bolus 2,000 mL (2,000 mLs Intravenous New Bag/Given 11/03/16 2011)  ondansetron (ZOFRAN) injection 4 mg (4 mg Intravenous Given 11/03/16 2010)  acetaminophen (TYLENOL) tablet 650 mg (650 mg Oral Given 11/03/16 2047)  promethazine (PHENERGAN) injection 12.5 mg (12.5 mg Intravenous Given 11/03/16 2051)     Initial Impression / Assessment and Plan / ED Course  I have reviewed the triage vital signs and the nursing notes.  Pertinent labs & imaging results that were available during my care of the patient were reviewed by me and considered  in my medical decision making (see chart for details).  Clinical Course as of Nov 03 2156  Sat Nov 03, 2016  1946 Sinus tachycardia noted on the monitor  [JK]    Clinical Course User Index [JK] Dorie Rank, MD   Patient presented to the emergency with quite some vomiting and diarrhea associated with abdominal pain. Laboratory tests are reassuring. Abdominal exam is benign. I doubt appendicitis or colitis or diverticulitis or other more serious condition.  ?viral GE although pt states she has been having recurrent episodes.  ?irritable bowel   Will dc home with bentyl and zofran.  Follow up with PCP  Final Clinical Impressions(s) / ED Diagnoses   Final diagnoses:  Gastroenteritis    New Prescriptions New Prescriptions   DICYCLOMINE (BENTYL) 20 MG TABLET    Take 1 tablet (20 mg total) by mouth 2 (two) times daily.   ONDANSETRON (ZOFRAN ODT) 8 MG DISINTEGRATING TABLET    Take 1 tablet (8 mg total) by mouth every 8 (eight) hours as needed for nausea or vomiting.     Dorie Rank, MD 11/03/16 2158

## 2016-12-21 ENCOUNTER — Ambulatory Visit (HOSPITAL_COMMUNITY)
Admission: EM | Admit: 2016-12-21 | Discharge: 2016-12-21 | Disposition: A | Discharge status: Home / Self Care | Payer: Medicaid Other | Attending: Internal Medicine | Admitting: Internal Medicine

## 2016-12-21 ENCOUNTER — Encounter (HOSPITAL_COMMUNITY): Payer: Self-pay | Admitting: Emergency Medicine

## 2016-12-21 DIAGNOSIS — H6591 Unspecified nonsuppurative otitis media, right ear: Secondary | ICD-10-CM

## 2016-12-21 MED ORDER — AMOXICILLIN 500 MG PO CAPS: 500.0000 mg | ORAL_CAPSULE | Freq: Two times a day (BID) | ORAL | 0 refills | Status: AC

## 2016-12-21 NOTE — ED Provider Notes (Signed)
CSN: [id number]     Arrival date & time 12/21/16  1153 History   First MD Initiated Contact with Patient 12/21/16 1258     Chief Complaint  Patient presents with  . Otitis Media   (Consider location/radiation/quality/duration/timing/severity/associated sxs/prior Treatment) Subjective:   [name] [name] is a 21 y.o. female who presents for possible ear infection. Symptoms include bilateral ear pain, left greater than right and a plugged sensation in the right ear. Onset of symptoms was 5 days ago and unchanged since that time. She denies any fevers, sweats, chills, congestion, facial pain, nasal discharge, shortness of breath, sinus pressure, sneezing or sore throat. No recent URI. She is drinking plenty of fluids  The following portions of the patient's history were reviewed and updated as appropriate: allergies, current  medications, past family history, past medical history, past social history, past surgical history and problem list.          Past Medical History:  Diagnosis Date  . Headache   . Medical history non-contributory   . Streptococcus B carrier or suspected carrier    Past Surgical History:  Procedure Laterality Date  . NO PAST SURGERIES     Family History  Problem Relation Age of Onset  . Healthy Mother   . Hypertension Father    Social History  Substance Use Topics  . Smoking status: Former Smoker    Quit date: 10/25/2010  . Smokeless tobacco: Never Used  . Alcohol use Yes     Comment: socially   OB History    Gravida Para Term Preterm AB Living   1 1 1     1    SAB TAB Ectopic Multiple Live Births         0 1     Review of Systems  Constitutional: Negative for chills, diaphoresis and fever.  HENT: Positive for ear pain. Negative for congestion, rhinorrhea, sinus pain, sinus pressure, sneezing, sore throat and tinnitus.   All other systems reviewed and are negative.   Allergies  Patient has no known allergies.  Home Medications   Prior to  Admission medications   Medication Sig Start Date End Date Taking? Authorizing Provider  acetaminophen (TYLENOL) 500 MG tablet Take 1,000 mg by mouth every 6 (six) hours as needed for headache.    [provider]  butalbital-acetaminophen-caffeine (FIORICET) 50-325-40 MG tablet Take 1-2 tablets by mouth every 6 (six) hours as needed for headache or migraine. Patient not taking: Reported on 11/03/2016 11/28/15   Gavin Pound, CNM  Calcium Carbonate Antacid (TUMS PO) Take 2 tablets by mouth once.    [provider]  Dextrose-Fructose-Sod Citrate (NAUZENE PO) Take 2 tablets by mouth once.    [provider]  diclofenac (VOLTAREN) 75 MG EC tablet Take 1 tablet (75 mg total) by mouth 2 (two) times daily. Patient not taking: Reported on 08/17/2016 03/24/16   Bjorn Pippin, PA-C  dicyclomine (BENTYL) 20 MG tablet Take 1 tablet (20 mg total) by mouth 2 (two) times daily. 11/03/16   Dorie Rank, MD  famotidine (PEPCID) 20 MG tablet Take 1 tablet (20 mg total) by mouth 2 (two) times daily. Patient not taking: Reported on 11/03/2016 08/10/16   Isla Pence, MD  HYDROcodone-acetaminophen (NORCO/VICODIN) 5-325 MG tablet Take 1 tablet by mouth every 4 (four) hours as needed. Patient not taking: Reported on 11/03/2016 08/10/16   Isla Pence, MD  ondansetron (ZOFRAN ODT) 8 MG disintegrating tablet Take 1 tablet (8 mg total) by mouth every 8 (eight)  hours as needed for nausea or vomiting. 11/03/16   Dorie Rank, MD  OxyCODONE HCl, Abuse Deter, (OXAYDO) 5 MG TABA Take 1 tablet by mouth every 4 (four) hours as needed. Patient not taking: Reported on 08/17/2016 11/04/15   Donnel Saxon, CNM  promethazine (PHENERGAN) 25 MG tablet Take 1 tablet (25 mg total) by mouth every 6 (six) hours as needed for nausea or vomiting. 12/13/15   Marcial Pacas, MD  rizatriptan (MAXALT-MLT) 5 MG disintegrating tablet Take 1 tablet (5 mg total) by mouth as needed for migraine. May repeat in 2 hours if needed Patient  not taking: Reported on 08/17/2016 12/13/15   Marcial Pacas, MD   Meds Ordered and Administered this Visit  Medications - No data to display  BP 133/74 (BP Location: Left Arm)   Pulse 78   Temp 98.2 F (36.8 C) (Oral)   Resp 16   LMP 12/01/2016   SpO2 99%   Breastfeeding? No  No data found.   Physical Exam  Constitutional: She is oriented to person, place, and time. She appears well-developed and well-nourished.  HENT:  Head: Normocephalic.  Bilateral external auditory canals clear. Right TM bright red. Left TM with mild redness. Hearing grossly intact. No nasal discharge. Oral cavity and pharynx normal. No inflammation, swelling, exudate or lesions noted.    Eyes: Conjunctivae and EOM are normal. Pupils are equal, round, and reactive to light.  Neck: Normal range of motion.  Cardiovascular: Normal rate and regular rhythm.   Pulmonary/Chest: Effort normal and breath sounds normal.  Musculoskeletal: Normal range of motion.  Neurological: She is alert and oriented to person, place, and time.  Skin: Skin is warm and dry.  Psychiatric: She has a normal mood and affect. Her behavior is normal.    Urgent Care Course     Procedures (including critical care time)  Labs Review Labs Reviewed - No data to display  Imaging Review No results found.   Visual Acuity Review  Right Eye Distance:   Left Eye Distance:   Bilateral Distance:    Right Eye Near:   Left Eye Near:    Bilateral Near:         MDM   1. Otitis media with effusion, right    1.  Treatment:  Amoxicillin 500 mg BID x 10 days  2.  OTC analgesics, fluids, rest.  3.  Follow up here or with PCP in 1 week if not improving.  Discussed diagnosis and treatment with patient. All questions have been answered and all concerns have been addressed. The patient verbalized understanding and had no further questions    Enrique Sack, [place] 12/21/16 1311

## 2016-12-21 NOTE — ED Triage Notes (Signed)
Pt here for bilateral ear pain/inf onset 6 days... Voices no other sx  Taking OTC ear drops x3 days w/no relief  A&O x4... NAD... Ambulatory

## 2017-03-18 ENCOUNTER — Other Ambulatory Visit: Payer: Self-pay | Admitting: Obstetrics and Gynecology

## 2017-03-18 DIAGNOSIS — N6459 Other signs and symptoms in breast: Secondary | ICD-10-CM

## 2017-03-29 ENCOUNTER — Ambulatory Visit (HOSPITAL_COMMUNITY)
Admission: EM | Admit: 2017-03-29 | Discharge: 2017-03-29 | Disposition: A | Discharge status: Home / Self Care | Payer: Medicaid Other | Attending: Emergency Medicine | Admitting: Emergency Medicine

## 2017-03-29 ENCOUNTER — Encounter (HOSPITAL_COMMUNITY): Payer: Self-pay | Admitting: Emergency Medicine

## 2017-03-29 DIAGNOSIS — M5442 Lumbago with sciatica, left side: Secondary | ICD-10-CM | POA: Diagnosis not present

## 2017-03-29 MED ORDER — PREDNISONE 10 MG (21) PO TBPK: ORAL_TABLET | ORAL | 0 refills | Status: DC

## 2017-03-29 NOTE — ED Provider Notes (Signed)
[name]    CSN: [id number] Arrival date & time: 03/29/17  1004     History   Chief Complaint Chief Complaint  Patient presents with  . Back Pain    HPI [name] [name] is a 21 y.o. female.   21 year old female complaining of recurrent pain in the left low back radiates down the left leg. She states this started about a year and a half ago right after she delivered a baby. Pain is located in a small area to the left low back. Pain is relatively mild now but there is localized tenderness. When she sits on the left buttock this produces a radicular pain in the left thigh. It is better when leaning or lying on the right side. No history of trauma.      Past Medical History:  Diagnosis Date  . Headache   . Medical history non-contributory   . Streptococcus B carrier or suspected carrier     Patient Active Problem List   Diagnosis Date Noted  . Chronic migraine 12/13/2015  . Low back pain 12/13/2015  . Normal vaginal delivery 11/02/2015  . Obesity (BMI 35.0-39.9 without comorbidity) 11/01/2015  . Abnormal fetal heart rate or rhythm (FHR) 11/01/2015  . Post term pregnancy over 40 weeks 11/01/2015    Past Surgical History:  Procedure Laterality Date  . NO PAST SURGERIES      OB History    Gravida Para Term Preterm AB Living   1 1 1     1    SAB TAB Ectopic Multiple Live Births         0 1       Home Medications    Prior to Admission medications   Medication Sig Start Date End Date Taking? Authorizing Provider  acetaminophen (TYLENOL) 500 MG tablet Take 1,000 mg by mouth every 6 (six) hours as needed for headache.    [provider]  predniSONE (STERAPRED UNI-PAK 21 TAB) 10 MG (21) TBPK tablet Dispense one 6 day pack. Take as directed with food. 03/29/17   Janne Napoleon, NP    Family History Family History  Problem Relation Age of Onset  . Healthy Mother   . Hypertension Father     Social History Social History  Substance Use Topics    . Smoking status: Former Smoker    Quit date: 10/25/2010  . Smokeless tobacco: Never Used  . Alcohol use Yes     Comment: socially     Allergies   Patient has no known allergies.   Review of Systems Review of Systems  Constitutional: Positive for activity change. Negative for fatigue and fever.  HENT: Negative.   Respiratory: Negative.   Gastrointestinal: Negative.   Genitourinary: Negative for decreased urine volume, dysuria, flank pain and frequency.       No incontinence.  Musculoskeletal: Positive for back pain. Negative for neck pain and neck stiffness.  Skin: Negative.   Neurological: Negative.   All other systems reviewed and are negative.    Physical Exam Triage Vital Signs ED Triage Vitals [03/29/17 1029]  Enc Vitals Group     BP (!) 143/85     Pulse Rate (!) 101     Resp 18     Temp 98.8 F (37.1 C)     Temp Source Oral     SpO2 100 %     Weight      Height      Head Circumference      Peak Flow  Pain Score      Pain Loc      Pain Edu?      Excl. in [place]?    No data found.   Updated Vital Signs BP (!) 143/85 (BP Location: Right Arm)   Pulse (!) 101   Temp 98.8 F (37.1 C) (Oral)   Resp 18   LMP 03/09/2017   SpO2 100%   Breastfeeding? No   Visual Acuity Right Eye Distance:   Left Eye Distance:   Bilateral Distance:    Right Eye Near:   Left Eye Near:    Bilateral Near:     Physical Exam  Constitutional: She is oriented to person, place, and time. She appears well-developed and well-nourished. No distress.  HENT:  Head: Normocephalic and atraumatic.  Eyes: EOM are normal.  Neck: Normal range of motion. Neck supple.  Pulmonary/Chest: Effort normal.  Musculoskeletal: She exhibits no edema or deformity.  Tenderness to a small area of the left para lumbosacral musculature. Currently no pain in that location. The patient is laying on the right buttock therefore she is not having radicular pain at this time. When she sits evenly with  pain on the left buttock she experiences pain radiating into the left thigh. She also experiences this pain with ambulation. No tenderness to the thigh musculature. Strength is normal. Distal neurovascular motor sensory is grossly intact. Equivocal straight leg raise. The patient is unable to maintain a relaxed quadricep musculature. Straightening the leg as well as dorsiflexion produces pain in the proximal lateral thigh, hip and low back.  Neurological: She is alert and oriented to person, place, and time. No cranial nerve deficit.  Skin: Skin is warm and dry.  Psychiatric: She has a normal mood and affect.  Nursing note and vitals reviewed.    UC Treatments / Results  Labs (all labs ordered are listed, but only abnormal results are displayed) Labs Reviewed - No data to display  EKG  EKG Interpretation None       Radiology No results found.  Procedures Procedures (including critical care time)  Medications Ordered in UC Medications - No data to display   Initial Impression / Assessment and Plan / UC Course  I have reviewed the triage vital signs and the nursing notes.  Pertinent labs & imaging results that were available during my care of the patient were reviewed by me and considered in my medical decision making (see chart for details).     Take steroid medicine as directed. Take with food. Follow-up with orthopedist as soon as possible. He may call initially to get an appointment. If necessary they may require referral from primary care. In that medication may follow-[facility] with [facility].   Final Clinical Impressions(s) / UC Diagnoses   Final diagnoses:  Acute left-sided low back pain with left-sided sciatica    New Prescriptions New Prescriptions   PREDNISONE (STERAPRED UNI-PAK 21 TAB) 10 MG (21) TBPK TABLET    Dispense one 6 day pack. Take as directed with food.     Controlled Substance Prescriptions [place] Controlled Substance Registry  consulted? Not Applicable   Janne Napoleon, NP 03/29/17 1057

## 2017-03-29 NOTE — ED Triage Notes (Signed)
Pt is here for lower back pain on left side that radiates down to left leg  Reports she's had this problem x1 year ++ but sx worsened yest... Sx will increases when she walks or moves but sitting still reports no pain.   Denies inj/trauma... Believes sx started after last pregnancy  A&O x4... NAD... Ambulatory

## 2017-03-29 NOTE — ED Triage Notes (Signed)
Denies urinary sx.

## 2017-03-29 NOTE — Discharge Instructions (Signed)
Take steroid medicine as directed. Take with food. Follow-up with orthopedist as soon as possible. He may call initially to get an appointment. If necessary they may require referral from primary care. In that medication may follow-up with [facility].

## 2017-04-01 ENCOUNTER — Ambulatory Visit
Admission: RE | Admit: 2017-04-01 | Discharge: 2017-04-01 | Disposition: A | Discharge status: Home / Self Care | Payer: Medicaid Other | Source: Ambulatory Visit | Attending: Obstetrics and Gynecology | Admitting: Obstetrics and Gynecology

## 2017-04-01 ENCOUNTER — Other Ambulatory Visit: Payer: Self-pay | Admitting: Obstetrics and Gynecology

## 2017-04-01 DIAGNOSIS — N6459 Other signs and symptoms in breast: Secondary | ICD-10-CM

## 2017-04-01 DIAGNOSIS — N63 Unspecified lump in unspecified breast: Secondary | ICD-10-CM

## 2017-04-02 ENCOUNTER — Ambulatory Visit (HOSPITAL_COMMUNITY)
Admission: EM | Admit: 2017-04-02 | Discharge: 2017-04-02 | Disposition: A | Discharge status: Home / Self Care | Payer: Medicaid Other | Attending: Physician Assistant | Admitting: Physician Assistant

## 2017-04-02 ENCOUNTER — Encounter (HOSPITAL_COMMUNITY): Payer: Self-pay | Admitting: Emergency Medicine

## 2017-04-02 DIAGNOSIS — M5432 Sciatica, left side: Secondary | ICD-10-CM | POA: Diagnosis not present

## 2017-04-02 DIAGNOSIS — G8929 Other chronic pain: Secondary | ICD-10-CM

## 2017-04-02 DIAGNOSIS — M5442 Lumbago with sciatica, left side: Secondary | ICD-10-CM

## 2017-04-02 DIAGNOSIS — Z3202 Encounter for pregnancy test, result negative: Secondary | ICD-10-CM

## 2017-04-02 LAB — POCT PREGNANCY, URINE: Preg Test, Ur: NEGATIVE

## 2017-04-02 MED ORDER — NORGESTIMATE-ETH ESTRADIOL 0.25-35 MG-MCG PO TABS: 1.0000 | ORAL_TABLET | Freq: Every day | ORAL | 0 refills | Status: DC

## 2017-04-02 MED ORDER — FAMOTIDINE 40 MG PO TABS: 40.0000 mg | ORAL_TABLET | Freq: Every day | ORAL | 0 refills | Status: DC

## 2017-04-02 MED ORDER — CYCLOBENZAPRINE HCL 10 MG PO TABS: 5.0000 mg | ORAL_TABLET | Freq: Three times a day (TID) | ORAL | 0 refills | Status: DC | PRN

## 2017-04-02 MED ORDER — TRAMADOL HCL 50 MG PO TABS: 50.0000 mg | ORAL_TABLET | Freq: Two times a day (BID) | ORAL | 0 refills | Status: DC | PRN

## 2017-04-02 MED ORDER — MELOXICAM 15 MG PO TABS: 7.5000 mg | ORAL_TABLET | Freq: Every day | ORAL | 0 refills | Status: AC

## 2017-04-02 NOTE — ED Provider Notes (Signed)
04/02/2017 6:13 PM   DOB: 30-Oct-1995 / MRN: [id number]  SUBJECTIVE:  [name] [name] is a 21 y.o. female presenting for left sided stabbing low back pain that started about 4 days ago. Has a history of this since having her son a year ago.  Has tried prednisone dose pack and tells me this is not helping her.  Nothing else has been tried.  She would like a referral to orthopedics for the pain. No abnormal vaginal discharge.  Moving bowels normal.  No bladder accidents.   She has No Known Allergies.   She  has a past medical history of Headache; Medical history non-contributory; and Streptococcus B carrier or suspected carrier.    She  reports that she quit smoking about 6 years ago. She has never used smokeless tobacco. She reports that she does not drink alcohol or use drugs. She  reports that she currently engages in sexual activity. She reports using the following method of birth control/protection: None. The patient  has a past surgical history that includes No past surgeries.  Her family history includes Healthy in her mother; Hypertension in her father.  Review of Systems  Constitutional: Negative for fever.  Eyes: Negative.   Gastrointestinal: Negative for nausea.  Skin: Negative for rash.  Neurological: Positive for tingling. Negative for dizziness, sensory change, speech change, focal weakness and headaches.    OBJECTIVE:  BP 135/73   Pulse 95   Temp 98.7 F (37.1 C) (Oral)   Resp 16   Ht 5\' 7"  (1.702 m)   Wt 250 lb (113.4 kg)   LMP 03/09/2017   SpO2 97%   BMI 39.16 kg/m   Physical Exam  Constitutional: She is oriented to person, place, and time. She appears well-developed and well-nourished.  Eyes: Pupils are equal, round, and reactive to light. EOM are normal.  Cardiovascular: Normal rate and regular rhythm.   Pulmonary/Chest: Effort normal and breath sounds normal.  Musculoskeletal: Normal range of motion. She exhibits tenderness (left piriformis).  Neurological: She  is alert and oriented to person, place, and time. She has normal strength. She is not disoriented. She displays no atrophy and normal reflexes. No cranial nerve deficit or sensory deficit. She exhibits normal muscle tone. Coordination and gait normal.  Psychiatric: Her behavior is normal.    No results found for this or any previous visit (from the past 72 hour(s)).  No results found.  ASSESSMENT AND PLAN:  The primary encounter diagnosis was Left sided sciatica. A diagnosis of Chronic left-sided low back pain with left-sided sciatica was also pertinent to this visit. History of GERD and is sexually active and not taking OCP.  Will start meloxicam, flexeril, tramadol, an OCP and cover for gastritis. She will call Dr. Lorin Mercy in ortho if she is not improving in about a week as we have done all we can for here her.     The patient is advised to call or return to clinic if she does not see an improvement in symptoms, or to seek the care of the closest emergency department if she worsens with the above plan.   Philis Fendt, MHS, PA-C 04/02/2017 6:13 PM    Tereasa Coop, PA-C 04/02/17 1825

## 2017-04-02 NOTE — Discharge Instructions (Addendum)
Results for orders placed or performed during the hospital encounter of 04/02/17  Pregnancy, urine POC  Result Value Ref Range   Preg Test, Ur NEGATIVE NEGATIVE   Start the birth control and do not miss doses.  Take Meloxicam daily and use the flexeril and tramadol sparingly. Use the famotidine every night at bed time.  Call the ortho in one week if you are not feeling better.

## 2017-04-02 NOTE — ED Triage Notes (Signed)
PT reports lower back pain that radiates down left leg. PT has had this intermittently since giving birth 1.5 years ago. This episode has gone on for 6 days. PT was seen here 6days ago for same

## 2017-04-05 ENCOUNTER — Encounter (HOSPITAL_COMMUNITY): Payer: Self-pay | Admitting: Emergency Medicine

## 2017-04-05 ENCOUNTER — Emergency Department (HOSPITAL_COMMUNITY)
Admission: EM | Admit: 2017-04-05 | Discharge: 2017-04-05 | Disposition: A | Discharge status: Home / Self Care | Payer: Medicaid Other | Attending: Emergency Medicine | Admitting: Emergency Medicine

## 2017-04-05 DIAGNOSIS — Z5321 Procedure and treatment not carried out due to patient leaving prior to being seen by health care provider: Secondary | ICD-10-CM | POA: Insufficient documentation

## 2017-04-05 DIAGNOSIS — M549 Dorsalgia, unspecified: Secondary | ICD-10-CM | POA: Insufficient documentation

## 2017-04-05 DIAGNOSIS — M79605 Pain in left leg: Secondary | ICD-10-CM | POA: Insufficient documentation

## 2017-04-05 NOTE — ED Triage Notes (Signed)
Pt reports L leg pain and back pain, has already been seen at [facility] twice in past week for same. Dx with sciatica and was told to follow up with ortho and given meds for pain. States meds are not working and has not followed up with ortho yet.

## 2017-08-06 NOTE — L&D Delivery Note (Signed)
Delivery Note At  a viable female was delivered via  (Presentation:OA ; [place] ).  APGAR:8 ,9 ; weight pending  .   Placenta status:complete, . 3V Cord:  with the following complications:None .    Anesthesia:  Epidural Episiotomy:  None Lacerations:  None Suture Repair: N/A Est. Blood Loss (mL):100cc    Mom to postpartum.  Baby to Couplet care / Skin to Skin.  Pleas Koch Prothero 04/24/2018, 6:49 AM

## 2017-09-09 LAB — OB RESULTS CONSOLE GC/CHLAMYDIA
Chlamydia: NEGATIVE
GC PROBE AMP, GENITAL: NEGATIVE

## 2017-09-09 LAB — OB RESULTS CONSOLE ANTIBODY SCREEN: Antibody Screen: NEGATIVE

## 2017-09-09 LAB — OB RESULTS CONSOLE RPR: RPR: NONREACTIVE

## 2017-09-09 LAB — OB RESULTS CONSOLE ABO/RH: RH TYPE: POSITIVE

## 2017-09-09 LAB — OB RESULTS CONSOLE HEPATITIS B SURFACE ANTIGEN: Hepatitis B Surface Ag: NEGATIVE

## 2017-09-09 LAB — OB RESULTS CONSOLE RUBELLA ANTIBODY, IGM: Rubella: IMMUNE

## 2017-09-09 LAB — OB RESULTS CONSOLE HIV ANTIBODY (ROUTINE TESTING): HIV: NONREACTIVE

## 2017-10-04 ENCOUNTER — Ambulatory Visit
Admission: RE | Admit: 2017-10-04 | Discharge: 2017-10-04 | Disposition: A | Discharge status: Home / Self Care | Payer: Medicaid Other | Source: Ambulatory Visit | Attending: Obstetrics and Gynecology | Admitting: Obstetrics and Gynecology

## 2017-10-04 DIAGNOSIS — N63 Unspecified lump in unspecified breast: Secondary | ICD-10-CM

## 2017-10-09 DIAGNOSIS — D259 Leiomyoma of uterus, unspecified: Secondary | ICD-10-CM | POA: Insufficient documentation

## 2017-12-11 ENCOUNTER — Inpatient Hospital Stay (HOSPITAL_COMMUNITY)
Admission: AD | Admit: 2017-12-11 | Payer: Medicaid Other | Source: Ambulatory Visit | Admitting: Obstetrics and Gynecology

## 2018-01-30 ENCOUNTER — Other Ambulatory Visit: Payer: Self-pay

## 2018-01-30 ENCOUNTER — Emergency Department (HOSPITAL_COMMUNITY)
Admission: EM | Admit: 2018-01-30 | Discharge: 2018-01-30 | Disposition: A | Discharge status: Home / Self Care | Payer: Medicaid Other | Attending: Emergency Medicine | Admitting: Emergency Medicine

## 2018-01-30 ENCOUNTER — Encounter (HOSPITAL_COMMUNITY): Payer: Self-pay | Admitting: *Deleted

## 2018-01-30 DIAGNOSIS — M79604 Pain in right leg: Secondary | ICD-10-CM | POA: Insufficient documentation

## 2018-01-30 DIAGNOSIS — Z87891 Personal history of nicotine dependence: Secondary | ICD-10-CM | POA: Diagnosis not present

## 2018-01-30 DIAGNOSIS — Z79899 Other long term (current) drug therapy: Secondary | ICD-10-CM | POA: Diagnosis not present

## 2018-01-30 MED ORDER — ENOXAPARIN SODIUM 120 MG/0.8ML ~~LOC~~ SOLN
120.0000 mg | Freq: Once | SUBCUTANEOUS | Status: AC
  Administered 2018-01-30: 120 mg via SUBCUTANEOUS
  Filled 2018-01-30: qty 0.8

## 2018-01-30 NOTE — ED Notes (Signed)
ED Provider at bedside. 

## 2018-01-30 NOTE — ED Provider Notes (Signed)
[facility] EMERGENCY DEPARTMENT Provider Note   CSN: [id number] Arrival date & time: 01/30/18  2040     History   Chief Complaint Chief Complaint  Patient presents with  . Leg Pain    HPI [name] [name] is a 22 y.o. female.  HPI 22 year old G2, P1 A0 currently [redacted] weeks pregnant presents to the emergency department complaints of increasing right calf pain and swelling over the past 2 to 3 days.  No history of DVT.  She feels like it is slightly swollen.  No injury or trauma.  She is concerned about the possibility of a DVT.  No weakness in her leg.  No other complaints.  No OB related complaints.  Denies chest pain or shortness of breath.   Past Medical History:  Diagnosis Date  . Headache   . Medical history non-contributory   . Streptococcus B carrier or suspected carrier     Patient Active Problem List   Diagnosis Date Noted  . Chronic migraine 12/13/2015  . Low back pain 12/13/2015  . Normal vaginal delivery 11/02/2015  . Obesity (BMI 35.0-39.9 without comorbidity) 11/01/2015  . Abnormal fetal heart rate or rhythm (FHR) 11/01/2015  . Post term pregnancy over 40 weeks 11/01/2015    Past Surgical History:  Procedure Laterality Date  . NO PAST SURGERIES       OB History    Gravida  2   Para  1   Term  1   Preterm      AB      Living  1     SAB      TAB      Ectopic      Multiple  0   Live Births  1            Home Medications    Prior to Admission medications   Medication Sig Start Date End Date Taking? Authorizing Provider  cyclobenzaprine (FLEXERIL) 10 MG tablet Take 0.5-1 tablets (5-10 mg total) by mouth 3 (three) times daily as needed for muscle spasms. Do not mix with narcotics. May cause drowsiness. 04/02/17   Tereasa Coop, PA-C  famotidine (PEPCID) 40 MG tablet Take 1 tablet (40 mg total) by mouth daily. 04/02/17   Tereasa Coop, PA-C  norgestimate-ethinyl estradiol (SPRINTEC 28) 0.25-35 MG-MCG tablet  Take 1 tablet by mouth daily. 04/02/17   Tereasa Coop, PA-C  traMADol (ULTRAM) 50 MG tablet Take 1 tablet (50 mg total) by mouth every 12 (twelve) hours as needed for severe pain. Use sparingly. 04/02/17   Tereasa Coop, PA-C    Family History Family History  Problem Relation Age of Onset  . Healthy Mother   . Hypertension Father     Social History Social History   Tobacco Use  . Smoking status: Former Smoker    Last attempt to quit: 10/25/2010    Years since quitting: 7.2  . Smokeless tobacco: Never Used  Substance Use Topics  . Alcohol use: No    Comment: socially  . Drug use: No     Allergies   Patient has no known allergies.   Review of Systems Review of Systems  All other systems reviewed and are negative.    Physical Exam Updated Vital Signs BP (!) 160/94 (BP Location: Right Arm)   Pulse (!) 110   Temp 97.8 F (36.6 C) (Oral)   Resp 18   LMP 03/09/2017   SpO2 98%   Physical Exam  Constitutional:  She is oriented to person, place, and time. She appears well-developed and well-nourished.  HENT:  Head: Normocephalic.  Eyes: EOM are normal.  Neck: Normal range of motion.  Pulmonary/Chest: Effort normal.  Abdominal: She exhibits no distension.  Musculoskeletal: Normal range of motion.  Mild tenderness and focal swelling of the right calf without erythema or overlying skin changes.  Normal pulses right foot.  Full range of motion of right lower extremity.  Neurological: She is alert and oriented to person, place, and time.  Psychiatric: She has a normal mood and affect.  Nursing note and vitals reviewed.    ED Treatments / Results  Labs (all labs ordered are listed, but only abnormal results are displayed) Labs Reviewed - No data to display  EKG None  Radiology No results found.  Procedures Procedures (including critical care time)  Medications Ordered in ED Medications  enoxaparin (LOVENOX) injection 1 mg/kg (has no administration in  time range)     Initial Impression / Assessment and Plan / ED Course  I have reviewed the triage vital signs and the nursing notes.  Pertinent labs & imaging results that were available during my care of the patient were reviewed by me and considered in my medical decision making (see chart for details).     Patient will need a venous duplex for further evaluation.  Currently unavailable at this time at night.  Patient be given a single dose Lovenox and will return in the morning per our normal pathway for outpatient venous duplex imaging.  Final Clinical Impressions(s) / ED Diagnoses   Final diagnoses:  Right leg pain    ED Discharge Orders        Ordered    LE VENOUS     01/30/18 2112       [name], MD 01/30/18 2120

## 2018-01-30 NOTE — ED Notes (Signed)
Discharge instructions reviewed with patient. Pt verbalized understanding of instructions and denied further requests.

## 2018-01-30 NOTE — ED Triage Notes (Signed)
Pt reports right calf pain for about 2-3 days. She says she feels like her leg is tighter that the other and feels warm to the touch. No meds PTA. Pt is currently [redacted] weeks pregnant. No new onset of CP or SOB

## 2018-01-30 NOTE — ED Provider Notes (Signed)
Patient placed in Quick Look pathway, seen and evaluated   Chief Complaint: right calf pain  HPI:   [name] [name] is a 22 y.o. G2P1001 @ [redacted]w[redacted]d gestation who presents to the ED with right calf pain. Pt reports right calf pain for about 2-3 days. She says she feels like her leg is tighter that the other and feels warm to the touch. No meds PTA. Patient getting prenatal care with [facility], CNM)  ROS: M/S: right lower leg pain  Physical Exam:   Gen: No distress  Neuro: Awake and Alert  Skin: Warm and dry  Abdomen: gravid @ 27.[redacted] weeks gestation  M/S: right calf tenderness   Initiation of care has begun. The patient has been counseled on the process, plan, and necessity for staying for the completion/evaluation, and the remainder of the medical screening examination    Tomia Murrain, NP 01/30/18 2058    [name], MD 01/30/18 2117

## 2018-01-31 ENCOUNTER — Ambulatory Visit (HOSPITAL_COMMUNITY)
Admission: RE | Admit: 2018-01-31 | Discharge: 2018-01-31 | Disposition: A | Discharge status: Home / Self Care | Payer: Medicaid Other | Source: Ambulatory Visit | Attending: Emergency Medicine | Admitting: Emergency Medicine

## 2018-01-31 DIAGNOSIS — M79604 Pain in right leg: Secondary | ICD-10-CM | POA: Diagnosis present

## 2018-01-31 DIAGNOSIS — O9989 Other specified diseases and conditions complicating pregnancy, childbirth and the puerperium: Secondary | ICD-10-CM | POA: Insufficient documentation

## 2018-01-31 DIAGNOSIS — M79609 Pain in unspecified limb: Secondary | ICD-10-CM

## 2018-01-31 DIAGNOSIS — Z3A28 28 weeks gestation of pregnancy: Secondary | ICD-10-CM | POA: Insufficient documentation

## 2018-01-31 DIAGNOSIS — M7989 Other specified soft tissue disorders: Secondary | ICD-10-CM

## 2018-01-31 NOTE — Progress Notes (Signed)
Right lower extremity venous duplex completed, No evidence of DVT or Baker's cyst. [name] [name],RVS 01/31/2018, 9:02 AM

## 2018-02-07 DIAGNOSIS — D649 Anemia, unspecified: Secondary | ICD-10-CM | POA: Insufficient documentation

## 2018-02-23 ENCOUNTER — Emergency Department (HOSPITAL_COMMUNITY)
Admission: EM | Admit: 2018-02-23 | Discharge: 2018-02-23 | Disposition: A | Discharge status: Home / Self Care | Payer: Medicaid Other | Attending: Emergency Medicine | Admitting: Emergency Medicine

## 2018-02-23 ENCOUNTER — Emergency Department (HOSPITAL_COMMUNITY): Payer: Medicaid Other

## 2018-02-23 ENCOUNTER — Encounter (HOSPITAL_COMMUNITY): Payer: Self-pay

## 2018-02-23 ENCOUNTER — Other Ambulatory Visit: Payer: Self-pay

## 2018-02-23 DIAGNOSIS — S9032XA Contusion of left foot, initial encounter: Secondary | ICD-10-CM | POA: Diagnosis not present

## 2018-02-23 DIAGNOSIS — W108XXA Fall (on) (from) other stairs and steps, initial encounter: Secondary | ICD-10-CM | POA: Diagnosis not present

## 2018-02-23 DIAGNOSIS — Z3A31 31 weeks gestation of pregnancy: Secondary | ICD-10-CM | POA: Insufficient documentation

## 2018-02-23 DIAGNOSIS — S99922A Unspecified injury of left foot, initial encounter: Secondary | ICD-10-CM | POA: Diagnosis not present

## 2018-02-23 DIAGNOSIS — W19XXXA Unspecified fall, initial encounter: Secondary | ICD-10-CM

## 2018-02-23 DIAGNOSIS — Z79899 Other long term (current) drug therapy: Secondary | ICD-10-CM | POA: Insufficient documentation

## 2018-02-23 DIAGNOSIS — Y998 Other external cause status: Secondary | ICD-10-CM | POA: Diagnosis not present

## 2018-02-23 DIAGNOSIS — M79672 Pain in left foot: Secondary | ICD-10-CM

## 2018-02-23 DIAGNOSIS — O9989 Other specified diseases and conditions complicating pregnancy, childbirth and the puerperium: Secondary | ICD-10-CM | POA: Diagnosis not present

## 2018-02-23 DIAGNOSIS — O9A213 Injury, poisoning and certain other consequences of external causes complicating pregnancy, third trimester: Secondary | ICD-10-CM | POA: Insufficient documentation

## 2018-02-23 DIAGNOSIS — Y9301 Activity, walking, marching and hiking: Secondary | ICD-10-CM | POA: Diagnosis not present

## 2018-02-23 DIAGNOSIS — Z87891 Personal history of nicotine dependence: Secondary | ICD-10-CM | POA: Insufficient documentation

## 2018-02-23 DIAGNOSIS — Y929 Unspecified place or not applicable: Secondary | ICD-10-CM | POA: Insufficient documentation

## 2018-02-23 NOTE — Progress Notes (Signed)
Received call from RN regarding pt--was told FHR was 150-informed to place on monitors

## 2018-02-23 NOTE — ED Triage Notes (Signed)
OB rapid response RN callled. PT placed on monitor FHT 147

## 2018-02-23 NOTE — Progress Notes (Signed)
Arrived to find pt in ED-c/o only pain in left lower foot-states she fell coming down the steps and landed on side of foot and elbow-pt states that abdomen did not hit the ground--denies any loss of fluid, bleeding, or UCs-pt says that "baby is fine I only came in to have my foot checked out"-external monitors adjusted and assessing

## 2018-02-23 NOTE — ED Provider Notes (Signed)
[name] [name] Note   CSN: [id number] Arrival date & time: 02/23/18  1446     History   Chief Complaint Chief Complaint  Patient presents with  . Fall    HPI [name] [name] is a 22 y.o. female.  The history is provided by the patient and medical records. No language interpreter was used.  Foot Injury   The incident occurred 3 to 5 hours ago. The incident occurred at home. The injury mechanism was a fall. The pain is present in the left foot and left ankle. The quality of the pain is described as aching. The pain is moderate. The pain has been constant since onset. Associated symptoms include inability to bear weight. Pertinent negatives include no numbness, no loss of motion, no loss of sensation and no tingling. She reports no foreign bodies present. The symptoms are aggravated by bearing weight and palpation. She has tried nothing for the symptoms. The treatment provided no relief.    Past Medical History:  Diagnosis Date  . Headache   . Medical history non-contributory   . Streptococcus B carrier or suspected carrier     Patient Active Problem List   Diagnosis Date Noted  . Chronic migraine 12/13/2015  . Low back pain 12/13/2015  . Normal vaginal delivery 11/02/2015  . Obesity (BMI 35.0-39.9 without comorbidity) 11/01/2015  . Abnormal fetal heart rate or rhythm (FHR) 11/01/2015  . Post term pregnancy over 40 weeks 11/01/2015    Past Surgical History:  Procedure Laterality Date  . NO PAST SURGERIES       OB History    Gravida  2   Para  1   Term  1   Preterm      AB      Living  1     SAB      TAB      Ectopic      Multiple  0   Live Births  1            Home Medications    Prior to Admission medications   Medication Sig Start Date End Date Taking? Authorizing [name]  cyclobenzaprine (FLEXERIL) 10 MG tablet Take 0.5-1 tablets (5-10 mg total) by mouth 3 (three) times daily as needed for  muscle spasms. Do not mix with narcotics. May cause drowsiness. 04/02/17   Tereasa Coop, PA-C  famotidine (PEPCID) 40 MG tablet Take 1 tablet (40 mg total) by mouth daily. 04/02/17   Tereasa Coop, PA-C  norgestimate-ethinyl estradiol (SPRINTEC 28) 0.25-35 MG-MCG tablet Take 1 tablet by mouth daily. 04/02/17   Tereasa Coop, PA-C  traMADol (ULTRAM) 50 MG tablet Take 1 tablet (50 mg total) by mouth every 12 (twelve) hours as needed for severe pain. Use sparingly. 04/02/17   Tereasa Coop, PA-C    Family History Family History  Problem Relation Age of Onset  . Healthy Mother   . Hypertension Father     Social History Social History   Tobacco Use  . Smoking status: Former Smoker    Last attempt to quit: 10/25/2010    Years since quitting: 7.3  . Smokeless tobacco: Never Used  Substance Use Topics  . Alcohol use: No    Comment: socially  . Drug use: No     Allergies   Patient has no known allergies.   Review of Systems Review of Systems  Constitutional: Negative for chills, fatigue and fever.  Respiratory: Negative for chest tightness  and shortness of breath.   Cardiovascular: Negative for chest pain.  Gastrointestinal: Negative for abdominal pain, constipation, diarrhea, nausea and vomiting.  Genitourinary: Negative for flank pain.  Musculoskeletal: Negative for back pain, neck pain and neck stiffness.  Skin: Negative for rash and wound.  Neurological: Negative for tingling, numbness and headaches.  All other systems reviewed and are negative.    Physical Exam Updated Vital Signs BP (!) 141/84 (BP Location: Right Arm)   Pulse (!) 110   Temp (!) 97.1 F (36.2 C) (Oral)   Resp 20   LMP 03/09/2017   SpO2 100%   Physical Exam  Constitutional: She appears well-developed and well-nourished. No distress.  HENT:  Head: Normocephalic and atraumatic.  Eyes: Conjunctivae are normal.  Neck: Neck supple.  Cardiovascular: Normal rate and regular rhythm.  No  murmur heard. Pulmonary/Chest: Effort normal and breath sounds normal. No respiratory distress. She has no wheezes. She exhibits no tenderness.  Abdominal: Soft. She exhibits distension (gravid). There is no tenderness.  Musculoskeletal: She exhibits tenderness. She exhibits no edema or deformity.       Left foot: There is tenderness. There is no swelling, normal capillary refill, no crepitus, no deformity and no laceration.       Feet:  Neurological: She is alert. No sensory deficit. She exhibits normal muscle tone.  Skin: Skin is warm and dry. Capillary refill takes less than 2 seconds. No rash noted. She is not diaphoretic. No erythema.  Psychiatric: She has a normal mood and affect.  Nursing note and vitals reviewed.    ED Treatments / Results  Labs (all labs ordered are listed, but only abnormal results are displayed) Labs Reviewed - No data to display  EKG None  Radiology Dg Foot Complete Left  Result Date: 02/23/2018 CLINICAL DATA:  Left foot pain after falling down stairs. EXAM: LEFT FOOT - COMPLETE 3+ VIEW COMPARISON:  None. FINDINGS: There is a curved configuration of the 5th metatarsal shaft. This has an appearance suggesting a congenital or old posttraumatic deformity. No acute fracture or dislocation is seen. Unremarkable soft tissues. IMPRESSION: No acute fracture. Electronically Signed   By: Claudie Revering M.D.   On: 02/23/2018 15:28    Procedures Procedures (including critical care time)  Medications Ordered in ED Medications - No data to display   Initial Impression / Assessment and Plan / ED Course  I have reviewed the triage vital signs and the nursing notes.  Pertinent labs & imaging results that were available during my care of the patient were reviewed by me and considered in my medical decision making (see chart for details).     [name] [name] is a 22 y.o. female who is [redacted] weeks pregnant who presents with left foot pain after a fall.  Patient reports  that she was going downstairs outside when she tripped and twisted her foot.  She reports that she has had pain with ambulation and bearing weight.  She reports that several weeks ago she also twisted her ankle and had left foot pain.  She reports no abdominal pain, headache, neck pain, or back pain.  She is only having symptoms in her left foot.  She has no history of ankle fracture or prior dislocation.  She denies any prior surgeries in the foot.  She reports her pain is mild to moderate and worsened with palpation.  On exam, patient had tenderness in the left lateral foot.  Patient did not have significant upper ankle tenderness.  Patient  did not have pain with ankle ambulation but had pain with foot movement.  Normal sensation and capillary refill distally.  No lacerations seen.  Slight bruising seen on the left lateral foot.  X-rays were obtained showing no fracture dislocation.  Old injury was seen.    Suspect the patient's progressive pregnancy is leading to some elasticity of the ligaments and tendons and the patient's body which may have contributed to her twisting her foot/ankle.  X-ray did not show fracture or dislocation.  Due to this being a recurrent injury, patient was placed in a cam walker and given crutches.  Patient is able to ambulate safely, patient was stable for discharge home to continue outpatient management.  Patient had no abdominal pain, is feeling the baby move, and has had no vaginal discharge, vaginal bleeding, or vaginal fluid.  I have low suspicion for intra-abdominal injury.  Fetal heart rate was found in triage to be 150 and patient reports baby has been moving normally.  Anticipate reassessment after crutches and Ankle brace.  Anticipate discharge.  Prior to discharge, rapid obstetrics team came to evaluate the patient.  They watch the patient for 2 hours and felt that baby was safe.  They feel patient is safe for discharge home from their standpoint.    Patient will  be discharged with the ankle brace and crutches.   Final Clinical Impressions(s) / ED Diagnoses   Final diagnoses:  Fall, initial encounter  Left foot pain    ED Discharge Orders    None      Clinical Impression: 1. Fall, initial encounter   2. Left foot pain     Disposition: Discharge  Condition: Good  I have discussed the results, Dx and Tx plan with the pt(& family if present). He/she/they expressed understanding and agree(s) with the plan. Discharge instructions discussed at great length. Strict return precautions discussed and pt &/or family have verbalized understanding of the instructions. No further questions at time of discharge.    New Prescriptions   No medications on file    Follow Up: [place] [address] [place] [facility] [id number] [phone] Schedule an appointment as soon as possible for a visit    [place] [address] [id number] mc [place]        Eden Toohey, Gwenyth Allegra, MD 02/23/18 Karl Bales

## 2018-02-23 NOTE — Discharge Instructions (Signed)
Your x-ray today did not show evidence of fracture dislocation I suspect there is a soft tissue or ligamentous injury to the foot.  Please use the boot we provided and crutches and follow-up with your PCP/OB/GYN.  Our OB/GYN team felt you are safe for discharge home after monitoring of your baby.  Please stay hydrated and be careful not to fall again.

## 2018-02-23 NOTE — Progress Notes (Signed)
Spoke with Dr. Nelda Marseille about patient-updated on FHR status and pt status post fall-orders to monitor x2 hours if FHR is reassuring may discharge--if any concerns with FHR or change in pt status- pt to be evaluated for 6 hours-updated pt on POC-pt frustrated but agrees to [place]

## 2018-02-23 NOTE — ED Triage Notes (Signed)
Per OB rapid response ,Pt will need to be monitored for 2 hrs.

## 2018-02-23 NOTE — ED Triage Notes (Signed)
PT given a Happy meal and juice because last meal was this morning.

## 2018-02-23 NOTE — ED Notes (Signed)
FHR of 150. Doppler rate found to the left of navel.

## 2018-02-23 NOTE — ED Triage Notes (Cosign Needed)
PT denies any ABD pain.

## 2018-02-23 NOTE — ED Triage Notes (Signed)
Pt states she was going down steps and had a mechanical fall down 2 steps. She reports left foot pain, some swelling noted. Pt also reports she is [redacted] weeks pregnant, denies abdominal or vaginal bleeding.

## 2018-02-28 DIAGNOSIS — Z3A32 32 weeks gestation of pregnancy: Secondary | ICD-10-CM | POA: Diagnosis not present

## 2018-02-28 DIAGNOSIS — O99213 Obesity complicating pregnancy, third trimester: Secondary | ICD-10-CM | POA: Diagnosis not present

## 2018-03-07 DIAGNOSIS — Z3A33 33 weeks gestation of pregnancy: Secondary | ICD-10-CM | POA: Diagnosis not present

## 2018-03-07 DIAGNOSIS — Z3403 Encounter for supervision of normal first pregnancy, third trimester: Secondary | ICD-10-CM | POA: Diagnosis not present

## 2018-03-07 DIAGNOSIS — O99213 Obesity complicating pregnancy, third trimester: Secondary | ICD-10-CM | POA: Diagnosis not present

## 2018-03-07 DIAGNOSIS — N6311 Unspecified lump in the right breast, upper outer quadrant: Secondary | ICD-10-CM | POA: Diagnosis not present

## 2018-03-10 DIAGNOSIS — O99213 Obesity complicating pregnancy, third trimester: Secondary | ICD-10-CM | POA: Diagnosis not present

## 2018-03-10 DIAGNOSIS — Z3A33 33 weeks gestation of pregnancy: Secondary | ICD-10-CM | POA: Diagnosis not present

## 2018-03-20 DIAGNOSIS — O9213 Cracked nipple associated with lactation: Secondary | ICD-10-CM | POA: Diagnosis not present

## 2018-03-20 DIAGNOSIS — Z3A34 34 weeks gestation of pregnancy: Secondary | ICD-10-CM | POA: Diagnosis not present

## 2018-03-26 DIAGNOSIS — Z3A35 35 weeks gestation of pregnancy: Secondary | ICD-10-CM | POA: Diagnosis not present

## 2018-03-26 DIAGNOSIS — O99213 Obesity complicating pregnancy, third trimester: Secondary | ICD-10-CM | POA: Diagnosis not present

## 2018-03-26 DIAGNOSIS — Z3403 Encounter for supervision of normal first pregnancy, third trimester: Secondary | ICD-10-CM | POA: Diagnosis not present

## 2018-03-26 LAB — OB RESULTS CONSOLE GBS: STREP GROUP B AG: NEGATIVE

## 2018-04-02 DIAGNOSIS — Z3A36 36 weeks gestation of pregnancy: Secondary | ICD-10-CM | POA: Diagnosis not present

## 2018-04-02 DIAGNOSIS — Z3403 Encounter for supervision of normal first pregnancy, third trimester: Secondary | ICD-10-CM | POA: Diagnosis not present

## 2018-04-02 DIAGNOSIS — O99213 Obesity complicating pregnancy, third trimester: Secondary | ICD-10-CM | POA: Diagnosis not present

## 2018-04-10 DIAGNOSIS — O99213 Obesity complicating pregnancy, third trimester: Secondary | ICD-10-CM | POA: Diagnosis not present

## 2018-04-10 DIAGNOSIS — Z3A37 37 weeks gestation of pregnancy: Secondary | ICD-10-CM | POA: Diagnosis not present

## 2018-04-14 ENCOUNTER — Telehealth (HOSPITAL_COMMUNITY): Payer: Self-pay | Admitting: *Deleted

## 2018-04-14 ENCOUNTER — Encounter (HOSPITAL_COMMUNITY): Payer: Self-pay | Admitting: *Deleted

## 2018-04-14 NOTE — Telephone Encounter (Signed)
Preadmission screen  

## 2018-04-18 ENCOUNTER — Other Ambulatory Visit: Payer: Self-pay | Admitting: Obstetrics and Gynecology

## 2018-04-23 ENCOUNTER — Encounter (HOSPITAL_COMMUNITY): Payer: Self-pay

## 2018-04-23 ENCOUNTER — Inpatient Hospital Stay (HOSPITAL_COMMUNITY)
Admission: RE | Admit: 2018-04-23 | Discharge: 2018-04-25 | DRG: 807 | Disposition: A | Discharge status: Home / Self Care | Payer: Medicaid Other | Attending: Obstetrics and Gynecology | Admitting: Obstetrics and Gynecology

## 2018-04-23 DIAGNOSIS — K2900 Acute gastritis without bleeding: Secondary | ICD-10-CM | POA: Diagnosis not present

## 2018-04-23 DIAGNOSIS — O9081 Anemia of the puerperium: Secondary | ICD-10-CM | POA: Diagnosis not present

## 2018-04-23 DIAGNOSIS — Z3A39 39 weeks gestation of pregnancy: Secondary | ICD-10-CM | POA: Diagnosis not present

## 2018-04-23 DIAGNOSIS — O99213 Obesity complicating pregnancy, third trimester: Secondary | ICD-10-CM | POA: Diagnosis not present

## 2018-04-23 DIAGNOSIS — E669 Obesity, unspecified: Secondary | ICD-10-CM | POA: Diagnosis not present

## 2018-04-23 DIAGNOSIS — R109 Unspecified abdominal pain: Secondary | ICD-10-CM | POA: Diagnosis present

## 2018-04-23 DIAGNOSIS — O9089 Other complications of the puerperium, not elsewhere classified: Secondary | ICD-10-CM | POA: Diagnosis not present

## 2018-04-23 DIAGNOSIS — Z87891 Personal history of nicotine dependence: Secondary | ICD-10-CM | POA: Diagnosis not present

## 2018-04-23 DIAGNOSIS — O99214 Obesity complicating childbirth: Secondary | ICD-10-CM | POA: Diagnosis present

## 2018-04-23 LAB — COMPREHENSIVE METABOLIC PANEL
ALBUMIN: 3 g/dL — AB (ref 3.5–5.0)
ALT: 12 U/L (ref 0–44)
AST: 15 U/L (ref 15–41)
Alkaline Phosphatase: 147 U/L — ABNORMAL HIGH (ref 38–126)
Anion gap: 10 (ref 5–15)
BILIRUBIN TOTAL: 0.3 mg/dL (ref 0.3–1.2)
BUN: 10 mg/dL (ref 6–20)
CALCIUM: 9 mg/dL (ref 8.9–10.3)
CO2: 18 mmol/L — AB (ref 22–32)
Chloride: 108 mmol/L (ref 98–111)
Creatinine, Ser: 0.62 mg/dL (ref 0.44–1.00)
GFR calc Af Amer: >60 mL/min (ref >60)
GFR calc non Af Amer: >60 mL/min (ref >60)
GLUCOSE: 100 mg/dL — AB (ref 70–99)
Potassium: 4.1 mmol/L (ref 3.5–5.1)
SODIUM: 136 mmol/L (ref 135–145)
TOTAL PROTEIN: 5.7 g/dL — AB (ref 6.5–8.1)

## 2018-04-23 LAB — CBC
HEMATOCRIT: 31.4 % — AB (ref 36.0–46.0)
HEMOGLOBIN: 10.2 g/dL — AB (ref 12.0–15.0)
MCH: 27.7 pg (ref 26.0–34.0)
MCHC: 32.5 g/dL (ref 30.0–36.0)
MCV: 85.3 fL (ref 78.0–100.0)
Platelets: 256 10*3/uL (ref 150–400)
RBC: 3.68 MIL/uL — ABNORMAL LOW (ref 3.87–5.11)
RDW: 14.7 % (ref 11.5–15.5)
WBC: 10.3 10*3/uL (ref 4.0–10.5)

## 2018-04-23 LAB — PROTEIN / CREATININE RATIO, URINE: CREATININE, URINE: 66 mg/dL

## 2018-04-23 LAB — TYPE AND SCREEN
ABO/RH(D): O POS
Antibody Screen: NEGATIVE

## 2018-04-23 LAB — URIC ACID: Uric Acid, Serum: 4.1 mg/dL (ref 2.5–7.1)

## 2018-04-23 MED ORDER — FLEET ENEMA 7-19 GM/118ML RE ENEM: 1.0000 | ENEMA | RECTAL | Status: DC | PRN

## 2018-04-23 MED ORDER — OXYTOCIN 40 UNITS IN LACTATED RINGERS INFUSION - SIMPLE MED
2.5000 [IU]/h | INTRAVENOUS | Status: DC
  Filled 2018-04-23: qty 1000

## 2018-04-23 MED ORDER — TERBUTALINE SULFATE 1 MG/ML IJ SOLN: 0.2500 mg | Freq: Once | INTRAMUSCULAR | Status: DC | PRN

## 2018-04-23 MED ORDER — LACTATED RINGERS IV SOLN
INTRAVENOUS | Status: DC
  Administered 2018-04-23: 13:00:00 via INTRAVENOUS

## 2018-04-23 MED ORDER — ONDANSETRON HCL 4 MG/2ML IJ SOLN
4.0000 mg | Freq: Four times a day (QID) | INTRAMUSCULAR | Status: DC | PRN
  Administered 2018-04-24: 4 mg via INTRAVENOUS
  Filled 2018-04-23: qty 2

## 2018-04-23 MED ORDER — LIDOCAINE HCL (PF) 1 % IJ SOLN
30.0000 mL | INTRAMUSCULAR | Status: DC | PRN
  Filled 2018-04-23: qty 30

## 2018-04-23 MED ORDER — OXYTOCIN BOLUS FROM INFUSION
500.0000 mL | Freq: Once | INTRAVENOUS | Status: AC
  Administered 2018-04-24: 500 mL via INTRAVENOUS

## 2018-04-23 MED ORDER — OXYTOCIN 40 UNITS IN LACTATED RINGERS INFUSION - SIMPLE MED: 1.0000 m[IU]/min | INTRAVENOUS | Status: DC

## 2018-04-23 MED ORDER — LACTATED RINGERS IV SOLN: 500.0000 mL | INTRAVENOUS | Status: DC | PRN

## 2018-04-23 MED ORDER — MISOPROSTOL 25 MCG QUARTER TABLET
25.0000 ug | ORAL_TABLET | ORAL | Status: DC | PRN
  Administered 2018-04-23 (×2): 25 ug via VAGINAL
  Filled 2018-04-23 (×3): qty 1

## 2018-04-23 MED ORDER — FENTANYL CITRATE (PF) 100 MCG/2ML IJ SOLN
50.0000 ug | INTRAMUSCULAR | Status: DC | PRN
  Administered 2018-04-23 – 2018-04-24 (×3): 100 ug via INTRAVENOUS
  Filled 2018-04-23 (×3): qty 2

## 2018-04-23 MED ORDER — SOD CITRATE-CITRIC ACID 500-334 MG/5ML PO SOLN: 30.0000 mL | ORAL | Status: DC | PRN

## 2018-04-23 MED ORDER — ACETAMINOPHEN 325 MG PO TABS: 650.0000 mg | ORAL_TABLET | ORAL | Status: DC | PRN

## 2018-04-23 NOTE — Progress Notes (Signed)
Subjective: Pt comfortable.    Objective: BP 113/60   Pulse 97   Temp 97.9 F (36.6 C) (Oral)   Resp 18   Ht 5\' 7"  (1.702 m)   Wt 133.1 kg   LMP 03/09/2017   BMI 45.95 kg/m  No intake/output data recorded. No intake/output data recorded.  FHT: Category 1 FHT 155 accels, no decels. UC:   irregular, every 3-5 minutes SVE:   Dilation: 2 Effacement (%): 50 Station: -3 Exam by:: N Reene Harlacher CNM  Foley bulb placed without difficulty.  60 cc fluid in balloon Assessment:  Pt is a G2P1001 at 39.5 IUP induction for increased BMI Cat 1 strip  Plan: Anticipate SVD  Starla Link CNM, MSN 04/23/2018, 11:46 PM

## 2018-04-23 NOTE — Anesthesia Pain Management Evaluation Note (Signed)
  [facility] Pain Management Visit Note  Patient: [name] [name], 22 y.o., female  "Hello I am a member of the anesthesia team at [facility]. We have an anesthesia team available at all times to provide care throughout the hospital, including epidural management and anesthesia for C-section. I don't know your plan for the delivery whether it a natural birth, water birth, IV sedation, nitrous supplementation, doula or epidural, but we want to meet your pain goals."   1.Was your pain managed to your expectations on prior hospitalizations?   Yes   2.What is your expectation for pain management during this hospitalization?     Epidural and IV pain meds  3.How can we help you reach that goal? Be available  Record the patient's initial score and the patient's pain goal.   Pain: 0  Pain Goal: 5 [facility] wants you to be able to say your pain was always managed very well.  [facility] 04/23/2018

## 2018-04-23 NOTE — H&P (Addendum)
[name] [name] is a 22 y.o. female presenting for induction of labor d/t increased BMI.  Denies LOF, ROM, VB and reports good FM.  Pt was scheduled to start induction this morning but admission was held by the hospital d/t increased census.  Pt was happy to finally get the induction going.  Adonis Brook, pt's RN called upon admission and reported Cat 1 tracing and mode of induction which is cytotec for cervical ripening d/t unfavorable cervix.  OB History    Gravida  2   Para  1   Term  1   Preterm  0   AB  0   Living  1     SAB  0   TAB  0   Ectopic  0   Multiple  0   Live Births  1          Past Medical History:  Diagnosis Date  . Anemia   . Fibroadenoma of left breast    needs follow up 1/20  . Fibroid   . Headache   . Medical history non-contributory   . Streptococcus B carrier or suspected carrier    Past Surgical History:  Procedure Laterality Date  . NO PAST SURGERIES     Family History: family history includes Healthy in her mother; Hypertension in her father. Social History:  reports that she quit smoking about 7 years ago. She has never used smokeless tobacco. She reports that she does not drink alcohol or use drugs.     Maternal Diabetes: No Genetic Screening: Normal Maternal Ultrasounds/Referrals: Normal Fetal Ultrasounds or other Referrals:  None Maternal Substance Abuse:  No Significant Maternal Medications:  None Significant Maternal Lab Results:  Lab values include: Group B Strep negative Other Comments:  None  ROS  Non-contributory History Dilation: 1(loose ) Effacement (%): 40 Station: -3 Exam by:: CGoodnight, RNC  Blood pressure 132/83, pulse (!) 101, temperature 97.9 F (36.6 C), temperature source Oral, resp. rate 18, height 5\' 7"  (1.702 m), weight 133.1 kg, last menstrual period 03/09/2017. Exam Physical Exam  Lungs CTA bilateral  CV RRR Abdomen gravid, NT Ext no calf tenderness FHT 135, + accels, no decels, moderate  variability Toco rare ctxs  Prenatal labs: ABO, Rh: --/--/O POS (09/18 1314) Antibody: NEG (09/18 1314) Rubella: Immune (02/04 0000) RPR: Nonreactive (02/04 0000)  HBsAg: Negative (02/04 0000)  HIV: Non-reactive (02/04 0000)  GBS: Negative (08/21 0000)   Assessment/Plan: P1 at 41 6/7 admitted for induction d/t increased BMI with unfavorable cervix.  Plan cytotec for cervical ripening.  Fetal status is cat 1.  Questions answered.  Pain medicine upon request.  Delice Lesch 04/23/2018, 6:11 PM  1853 Doing well.  Fetal status cat 1.  S/p 2nd dose of cytotec.

## 2018-04-23 NOTE — Progress Notes (Signed)
Patient off monitors to ambulate in hall. E. Elza Sortor RN

## 2018-04-24 ENCOUNTER — Inpatient Hospital Stay (HOSPITAL_COMMUNITY): Payer: Medicaid Other | Admitting: Anesthesiology

## 2018-04-24 ENCOUNTER — Encounter (HOSPITAL_COMMUNITY): Payer: Self-pay

## 2018-04-24 LAB — RPR: RPR Ser Ql: NONREACTIVE

## 2018-04-24 MED ORDER — PRENATAL MULTIVITAMIN CH
1.0000 | ORAL_TABLET | Freq: Every day | ORAL | Status: DC
  Administered 2018-04-24 – 2018-04-25 (×2): 1 via ORAL
  Filled 2018-04-24 (×2): qty 1

## 2018-04-24 MED ORDER — SENNOSIDES-DOCUSATE SODIUM 8.6-50 MG PO TABS
2.0000 | ORAL_TABLET | ORAL | Status: DC
  Administered 2018-04-25: 2 via ORAL
  Filled 2018-04-24: qty 2

## 2018-04-24 MED ORDER — FENTANYL 2.5 MCG/ML BUPIVACAINE 1/10 % EPIDURAL INFUSION (WH - ANES)
14.0000 mL/h | INTRAMUSCULAR | Status: DC | PRN
  Administered 2018-04-24: 14 mL/h via EPIDURAL

## 2018-04-24 MED ORDER — WITCH HAZEL-GLYCERIN EX PADS: 1.0000 "application " | MEDICATED_PAD | CUTANEOUS | Status: DC | PRN

## 2018-04-24 MED ORDER — LIDOCAINE-EPINEPHRINE (PF) 2 %-1:200000 IJ SOLN
INTRAMUSCULAR | Status: DC | PRN
  Administered 2018-04-24 (×2): 4 mL via EPIDURAL

## 2018-04-24 MED ORDER — LACTATED RINGERS IV SOLN: 500.0000 mL | Freq: Once | INTRAVENOUS | Status: DC

## 2018-04-24 MED ORDER — EPHEDRINE 5 MG/ML INJ
10.0000 mg | INTRAVENOUS | Status: DC | PRN
  Administered 2018-04-24: 10 mg via INTRAVENOUS

## 2018-04-24 MED ORDER — ZOLPIDEM TARTRATE 5 MG PO TABS: 5.0000 mg | ORAL_TABLET | Freq: Every evening | ORAL | Status: DC | PRN

## 2018-04-24 MED ORDER — PHENYLEPHRINE 40 MCG/ML (10ML) SYRINGE FOR IV PUSH (FOR BLOOD PRESSURE SUPPORT): 80.0000 ug | PREFILLED_SYRINGE | INTRAVENOUS | Status: DC | PRN

## 2018-04-24 MED ORDER — DIPHENHYDRAMINE HCL 50 MG/ML IJ SOLN: 12.5000 mg | INTRAMUSCULAR | Status: DC | PRN

## 2018-04-24 MED ORDER — IBUPROFEN 600 MG PO TABS
600.0000 mg | ORAL_TABLET | Freq: Four times a day (QID) | ORAL | Status: DC
  Administered 2018-04-24 – 2018-04-25 (×5): 600 mg via ORAL
  Filled 2018-04-24 (×5): qty 1

## 2018-04-24 MED ORDER — ACETAMINOPHEN 325 MG PO TABS
650.0000 mg | ORAL_TABLET | ORAL | Status: DC | PRN
  Administered 2018-04-24 (×2): 650 mg via ORAL
  Filled 2018-04-24 (×2): qty 2

## 2018-04-24 MED ORDER — ONDANSETRON HCL 4 MG PO TABS: 4.0000 mg | ORAL_TABLET | ORAL | Status: DC | PRN

## 2018-04-24 MED ORDER — SIMETHICONE 80 MG PO CHEW: 80.0000 mg | CHEWABLE_TABLET | ORAL | Status: DC | PRN

## 2018-04-24 MED ORDER — BENZOCAINE-MENTHOL 20-0.5 % EX AERO
1.0000 "application " | INHALATION_SPRAY | CUTANEOUS | Status: DC | PRN
  Administered 2018-04-24: 1 via TOPICAL
  Filled 2018-04-24 (×2): qty 56

## 2018-04-24 MED ORDER — COCONUT OIL OIL
1.0000 "application " | TOPICAL_OIL | Status: DC | PRN
  Filled 2018-04-24: qty 120

## 2018-04-24 MED ORDER — VITAMIN K1 1 MG/0.5ML IJ SOLN
INTRAMUSCULAR | Status: AC
  Filled 2018-04-24: qty 0.5

## 2018-04-24 MED ORDER — FENTANYL 2.5 MCG/ML BUPIVACAINE 1/10 % EPIDURAL INFUSION (WH - ANES)
INTRAMUSCULAR | Status: AC
  Filled 2018-04-24: qty 100

## 2018-04-24 MED ORDER — DIBUCAINE 1 % RE OINT
1.0000 "application " | TOPICAL_OINTMENT | RECTAL | Status: DC | PRN
  Filled 2018-04-24: qty 28

## 2018-04-24 MED ORDER — PHENYLEPHRINE 40 MCG/ML (10ML) SYRINGE FOR IV PUSH (FOR BLOOD PRESSURE SUPPORT)
PREFILLED_SYRINGE | INTRAVENOUS | Status: AC
  Filled 2018-04-24: qty 10

## 2018-04-24 MED ORDER — OXYCODONE-ACETAMINOPHEN 5-325 MG PO TABS: 1.0000 | ORAL_TABLET | ORAL | Status: DC | PRN

## 2018-04-24 MED ORDER — ONDANSETRON HCL 4 MG/2ML IJ SOLN: 4.0000 mg | INTRAMUSCULAR | Status: DC | PRN

## 2018-04-24 MED ORDER — DIPHENHYDRAMINE HCL 25 MG PO CAPS: 25.0000 mg | ORAL_CAPSULE | Freq: Four times a day (QID) | ORAL | Status: DC | PRN

## 2018-04-24 MED ORDER — PHENYLEPHRINE 40 MCG/ML (10ML) SYRINGE FOR IV PUSH (FOR BLOOD PRESSURE SUPPORT)
80.0000 ug | PREFILLED_SYRINGE | INTRAVENOUS | Status: AC | PRN
  Administered 2018-04-24 (×3): 80 ug via INTRAVENOUS

## 2018-04-24 MED ORDER — TETANUS-DIPHTH-ACELL PERTUSSIS 5-2.5-18.5 LF-MCG/0.5 IM SUSP
0.5000 mL | Freq: Once | INTRAMUSCULAR | Status: DC
  Filled 2018-04-24: qty 0.5

## 2018-04-24 MED ORDER — OXYCODONE-ACETAMINOPHEN 5-325 MG PO TABS: 2.0000 | ORAL_TABLET | ORAL | Status: DC | PRN

## 2018-04-24 NOTE — Anesthesia Postprocedure Evaluation (Signed)
Anesthesia Post Note  Patient: [name] [name]  Procedure(s) Performed: AN AD HOC LABOR EPIDURAL     Patient location during evaluation: Mother Baby Anesthesia Type: Epidural Level of consciousness: awake and alert Pain management: pain level controlled Vital Signs Assessment: post-procedure vital signs reviewed and stable Respiratory status: spontaneous breathing, nonlabored ventilation and respiratory function stable Cardiovascular status: stable Postop Assessment: no headache, no backache, epidural receding, able to ambulate, adequate PO intake, no apparent nausea or vomiting and patient able to bend at knees Anesthetic complications: no    Last Vitals:  Vitals:   04/24/18 1337 04/24/18 1707  BP: 130/76 136/78  Pulse:  89  Resp:    Temp:    SpO2:      Last Pain:  Vitals:   04/24/18 1703  TempSrc:   PainSc: 3    Pain Goal:                 Jabier Mutton

## 2018-04-24 NOTE — Anesthesia Procedure Notes (Signed)
Epidural Patient location during procedure: OB Start time: 04/24/2018 3:00 AM End time: 04/24/2018 3:20 AM  Staffing Anesthesiologist: Freddrick March, MD Performed: anesthesiologist   Preanesthetic Checklist Completed: patient identified, pre-op evaluation, timeout performed, IV checked, risks and benefits discussed and monitors and equipment checked  Epidural Patient position: sitting Prep: site prepped and draped and DuraPrep Patient monitoring: continuous pulse ox, blood pressure, heart rate and cardiac monitor Approach: midline Location: L3-L4 Injection technique: LOR air  Needle:  Needle type: Tuohy  Needle gauge: 17 G Needle length: 9 cm Needle insertion depth: 9 cm Catheter type: closed end flexible Catheter size: 19 Gauge Catheter at skin depth: 15 cm Test dose: negative  Assessment Sensory level: T8 Events: blood not aspirated, injection not painful, no injection resistance, negative IV test and no paresthesia  Additional Notes Patient identified. Risks/Benefits/Options discussed with patient including but not limited to bleeding, infection, nerve damage, paralysis, failed block, incomplete pain control, headache, blood pressure changes, nausea, vomiting, reactions to medication both or allergic, itching and postpartum back pain. Confirmed with bedside nurse the patient's most recent platelet count. Confirmed with patient that they are not currently taking any anticoagulation, have any bleeding history or any family history of bleeding disorders. Patient expressed understanding and wished to proceed. All questions were answered. Sterile technique was used throughout the entire procedure. Please see nursing notes for vital signs. Test dose was given through epidural catheter and negative prior to continuing to dose epidural or start infusion. Warning signs of high block given to the patient including shortness of breath, tingling/numbness in hands, complete motor block,  or any concerning symptoms with instructions to call for help. Patient was given instructions on fall risk and not to get out of bed. All questions and concerns addressed with instructions to call with any issues or inadequate analgesia.  Reason for block:procedure for pain

## 2018-04-24 NOTE — Progress Notes (Signed)
Patient off monitors to ambulate in hallway. E. Valeree Leidy RN

## 2018-04-24 NOTE — Anesthesia Preprocedure Evaluation (Signed)
Anesthesia Evaluation  Patient identified by MRN, date of birth, ID band Patient awake    Reviewed: Allergy & Precautions, NPO status , Patient's Chart, lab work & pertinent test results  Airway Mallampati: II  TM Distance: >3 FB Neck ROM: Full    Dental no notable dental hx.    Pulmonary former smoker,    Pulmonary exam normal breath sounds clear to auscultation       Cardiovascular negative cardio ROS Normal cardiovascular exam Rhythm:Regular Rate:Normal     Neuro/Psych  Headaches, negative psych ROS   GI/Hepatic negative GI ROS, Neg liver ROS,   Endo/Other  negative endocrine ROS  Renal/GU negative Renal ROS  negative genitourinary   Musculoskeletal negative musculoskeletal ROS (+)   Abdominal   Peds  Hematology  (+) anemia ,   Anesthesia Other Findings   Reproductive/Obstetrics (+) Pregnancy                             Anesthesia Physical Anesthesia Plan  ASA: III  Anesthesia Plan: Epidural   Post-op Pain Management:    Induction:   PONV Risk Score and Plan: Treatment may vary due to age or medical condition  Airway Management Planned: Natural Airway  Additional Equipment:   Intra-op Plan:   Post-operative Plan:   Informed Consent: I have reviewed the patients History and Physical, chart, labs and discussed the procedure including the risks, benefits and alternatives for the proposed anesthesia with the patient or authorized representative who has indicated his/her understanding and acceptance.     Plan Discussed with: Anesthesiologist  Anesthesia Plan Comments: (Patient identified. Risks, benefits, options discussed with patient including but not limited to bleeding, infection, nerve damage, paralysis, failed block, incomplete pain control, headache, blood pressure changes, nausea, vomiting, reactions to medication, itching, and post partum back pain. Confirmed with  bedside nurse the patient's most recent platelet count. Confirmed with the patient that they are not taking any anticoagulation, have any bleeding history or any family history of bleeding disorders. Patient expressed understanding and wishes to proceed. All questions were answered. )        Anesthesia Quick Evaluation

## 2018-04-25 ENCOUNTER — Inpatient Hospital Stay (HOSPITAL_COMMUNITY)
Admission: AD | Admit: 2018-04-25 | Discharge: 2018-04-26 | Disposition: A | Discharge status: Home / Self Care | Payer: Medicaid Other | Source: Ambulatory Visit | Attending: Obstetrics & Gynecology | Admitting: Obstetrics & Gynecology

## 2018-04-25 DIAGNOSIS — O9089 Other complications of the puerperium, not elsewhere classified: Secondary | ICD-10-CM | POA: Diagnosis not present

## 2018-04-25 DIAGNOSIS — Z87891 Personal history of nicotine dependence: Secondary | ICD-10-CM | POA: Insufficient documentation

## 2018-04-25 DIAGNOSIS — K2900 Acute gastritis without bleeding: Secondary | ICD-10-CM | POA: Diagnosis not present

## 2018-04-25 DIAGNOSIS — R110 Nausea: Secondary | ICD-10-CM | POA: Diagnosis not present

## 2018-04-25 DIAGNOSIS — R1013 Epigastric pain: Secondary | ICD-10-CM

## 2018-04-25 DIAGNOSIS — O9081 Anemia of the puerperium: Secondary | ICD-10-CM

## 2018-04-25 DIAGNOSIS — R109 Unspecified abdominal pain: Secondary | ICD-10-CM

## 2018-04-25 DIAGNOSIS — R1010 Upper abdominal pain, unspecified: Secondary | ICD-10-CM | POA: Diagnosis not present

## 2018-04-25 LAB — CBC
HEMATOCRIT: 26.8 % — AB (ref 36.0–46.0)
Hemoglobin: 8.8 g/dL — ABNORMAL LOW (ref 12.0–15.0)
MCH: 28.3 pg (ref 26.0–34.0)
MCHC: 32.8 g/dL (ref 30.0–36.0)
MCV: 86.2 fL (ref 78.0–100.0)
Platelets: 214 10*3/uL (ref 150–400)
RBC: 3.11 MIL/uL — AB (ref 3.87–5.11)
RDW: 14.9 % (ref 11.5–15.5)
WBC: 9.7 10*3/uL (ref 4.0–10.5)

## 2018-04-25 MED ORDER — IBUPROFEN 600 MG PO TABS: 600.0000 mg | ORAL_TABLET | Freq: Four times a day (QID) | ORAL | 0 refills | Status: DC

## 2018-04-25 MED ORDER — FERROUS SULFATE 325 (65 FE) MG PO TABS: 325.0000 mg | ORAL_TABLET | Freq: Two times a day (BID) | ORAL | 3 refills | Status: DC

## 2018-04-25 NOTE — Discharge Summary (Signed)
SVD OB Discharge Summary     Patient Name: [name] [name] DOB: 1995/10/17 MRN: [id number]  Date of admission: 04/23/2018 Delivering MD: Starla Link  Date of delivery: 04/24/2018 Type of delivery: SVD  Newborn Data: Sex: Baby female  Circumcision: No Live born female  Birth Weight: 9 lb 13 oz (4451 g) APGAR: 8, 9  Newborn Delivery   Birth date/time:  04/24/2018 06:21:00 Delivery type:  Vaginal, Spontaneous     Feeding: breast Infant being discharge to home with mother in stable condition.   Admitting diagnosis: 39wks induction  Intrauterine pregnancy: [redacted]w[redacted]d     Secondary diagnosis:  Active Problems:   Obesity affecting pregnancy in third trimester   SVD (spontaneous vaginal delivery)   Normal postpartum course                                Complications: None                                                              Intrapartum Procedures: spontaneous vaginal delivery Postpartum Procedures: none Complications-Operative and Postpartum: none, Anemia.  Augmentation: Cytotec and Foley Balloon   History of Present Illness: Ms. GERALDINE [name] is a 22 y.o. female, [id number], who presents at [redacted]w[redacted]d weeks gestation. The patient has been followed at  [facility]  Her pregnancy has been complicated by:  Patient Active Problem List   Diagnosis Date Noted  . Normal postpartum course 04/25/2018  . Postpartum anemia 04/25/2018  . SVD (spontaneous vaginal delivery) 04/24/2018  . Obesity affecting pregnancy in third trimester 04/23/2018  . Chronic migraine 12/13/2015  . Low back pain 12/13/2015  . Normal vaginal delivery 11/02/2015  . Obesity (BMI 35.0-39.9 without comorbidity) 11/01/2015  . Abnormal fetal heart rate or rhythm (FHR) 11/01/2015  . Post term pregnancy over 40 weeks 11/01/2015    Hospital course:  Induction of Labor With Vaginal Delivery   22 y.o. yo [id number] at [redacted]w[redacted]d was admitted to the hospital 04/23/2018 for  induction of labor.  Indication for induction: Elective and increased BMI.  Patient had an uncomplicated labor course as follows: Membrane Rupture Time/Date: 12:47 AM ,04/24/2018   Intrapartum Procedures: Episiotomy: None [1]                                         Lacerations:  None [1]  Patient had delivery of a Viable infant.  Information for the patient's newborn:  [name], Crenshaw Girl [name] [id number]  Delivery Method: Vaginal, Spontaneous(Filed from Delivery Summary)   04/24/2018  Details of delivery can be found in separate delivery note.  Patient had a routine postpartum course. Patient is discharged home 04/25/18. Postpartum Day # 2 : S/P NSVD due to IOL: maternal obesity and elective. Patient up ad lib, denies syncope or dizziness. Reports consuming regular diet without issues and denies N/V. Patient reports 0 bowel movement + passing flatus.  Denies issues with urination and reports bleeding is "light."  Patient is Breastfeeding and reports going well.  Desires husband to get vasectomy for postpartum contraception.  Pain is being appropriately managed with use of motrin.  Physical exam  Vitals:   04/24/18 1337 04/24/18 1707 04/24/18 2100 04/25/18 0600  BP: 130/76 136/78 124/66 121/62  Pulse:  89 83 91  Resp:   18 18  Temp:   97.9 F (36.6 C) 97.9 F (36.6 C)  TempSrc:   Oral Oral  SpO2:   100% 100%  Weight:      Height:       General: alert, cooperative and no distress Lochia: appropriate Uterine Fundus: firm Incision: Healing well with no significant drainage, No significant erythema, Dressing is clean, dry, and intact, honeycomb dressing CDI Perineum: Intact DVT Evaluation: No evidence of DVT seen on physical exam. Negative Homan's sign. No cords or calf tenderness. No significant calf/ankle edema.  Labs: Lab Results  Component Value Date   WBC 9.7 04/25/2018   HGB 8.8 (L) 04/25/2018   HCT 26.8 (L) 04/25/2018   MCV 86.2 04/25/2018   PLT 214 04/25/2018   CMP Latest  Ref Rng & Units 04/23/2018  Glucose 70 - 99 mg/dL 100(H)  BUN 6 - 20 mg/dL 10  Creatinine 0.44 - 1.00 mg/dL 0.62  Sodium 135 - 145 mmol/L 136  Potassium 3.5 - 5.1 mmol/L 4.1  Chloride 98 - 111 mmol/L 108  CO2 22 - 32 mmol/L 18(L)  Calcium 8.9 - 10.3 mg/dL 9.0  Total Protein 6.5 - 8.1 g/dL 5.7(L)  Total Bilirubin 0.3 - 1.2 mg/dL 0.3  Alkaline Phos 38 - 126 U/L 147(H)  AST 15 - 41 U/L 15  ALT 0 - 44 U/L 12    Date of discharge: 04/25/2018 Discharge Diagnoses: Term Pregnancy-delivered Discharge instruction: per After Visit Summary and "Baby and Me Booklet".  After visit meds:  Allergies as of 04/25/2018   No Known Allergies     Medication List    TAKE these medications   acetaminophen 500 MG tablet Commonly known as:  TYLENOL Take 500 mg by mouth every 6 (six) hours as needed for headache.   ferrous sulfate 325 (65 FE) MG tablet Take 1 tablet (325 mg total) by mouth 2 (two) times daily with a meal.   ibuprofen 600 MG tablet Commonly known as:  ADVIL,MOTRIN Take 1 tablet (600 mg total) by mouth every 6 (six) hours.   prenatal multivitamin Tabs tablet Take 1 tablet by mouth daily at 12 noon.       Activity:           pelvic rest Advance as tolerated. Pelvic rest for 6 weeks.  Diet:                routine Medications: PNV, Ibuprofen, Colace and Iron Postpartum contraception: Vasectomy Condition:  Pt discharge to home with baby in stable Anemia : PO iron.   Meds: Allergies as of 04/25/2018   No Known Allergies     Medication List    TAKE these medications   acetaminophen 500 MG tablet Commonly known as:  TYLENOL Take 500 mg by mouth every 6 (six) hours as needed for headache.   ferrous sulfate 325 (65 FE) MG tablet Take 1 tablet (325 mg total) by mouth 2 (two) times daily with a meal.   ibuprofen 600 MG tablet Commonly known as:  ADVIL,MOTRIN Take 1 tablet (600 mg total) by mouth every 6 (six) hours.   prenatal multivitamin Tabs tablet Take 1 tablet by  mouth daily at 12 noon.       Discharge Follow Up:  [facility] Follow up in 6  week(s).   Specialty:  Obstetrics and Gynecology Contact information: [address] [place] [address] [place], NP-C, [place] 04/25/2018, 1:51 PM  Noralyn Pick, FNP

## 2018-04-25 NOTE — MAU Note (Signed)
Had svd yesterday with not problems. Has had upper abd pains off and on all day. This afternoon woke up and was having upper abd pain and nausea. NO vomiting. Pain there whether I eat or not. Sometimes chest feels tight and like heart is racing. (Pt's baby in NICU due to jaundice. Pt requested d/c today but is staying in hospital due to baby in NICU)

## 2018-04-25 NOTE — Lactation Note (Signed)
This note was copied from a baby's chart. Lactation Consultation Note  Patient Name: [name] [name] CHENI'D Date: 04/25/2018 Reason for consult: Initial assessment;Term;Nipple pain/trauma;Other (Comment)(History of low milk supply with 1st baby)  Visited with P2 Mom of term baby at 32 hrs.  Baby at 5% weight loss (23 hrs of age). Mom resting in bed with baby by her side, swaddled.   Mom states baby has been breastfeeding, but she has pain when baby latches.  Mom then shared that she wasn't able to make much milk at all with her first baby (2 yrs ago).  She describes pumping using the Symphony pump after first day, and never expressed much at all.  Baby became jaundiced and went under phototherapy until day 5.   Mom denies any breast changes with either pregnancy, "they never grew".  Breasts are suspicious of Insufficient Glandular Tissue (IGT) as they are widely spaced, and little ductal tissue palpated.   Reviewed breast massage and hand expression, unable to express a drop.  Mom shared that she hasn't seen or heard any swallowing.  Talked about options with Mom.  To supplement and double pump, or supplement at the breast with SNS, or use SNS at the breast and pump.  Mom aware of need to supplement, and understands that to supplement at the breast is best option in regards to milk supply stimulation.  Mom interested in SNS at the breast, but not interested in pumping at this time.  Mom explained importance of early stimulation may benefit milk supply.  Bilirubin level high intermediate zone  Assisted Mom with 1st feeding at the breast using the Starter SNS.  Baby latched easily in cross cradle hold.  Mom's hand placement needed slight adjustment, but baby became nutritive and wide, deep latch to breast was attained, baby took 20 ml formula.  TLC given to Mom regarding needing to supplement.  Shared with her that each baby is different, and she should take a day at a time.    Plan- 1- Keep  baby STS as much as possible. 2- Latch baby on cue, goal of >8 feedings per 24 hrs. 3- Use SNS at the breast, or finger using 20 formula 4- Ask your nurse to set up DEBP if decides to pump 5- Ask for help prn. 6- recommend Lactation follow-up after discharge.  Shared findings with her nurse Cleda Daub RN.     Consult Status Consult Status: Follow-up Date: 04/26/18 Follow-up type: In-patient    [name] 04/25/2018, 2:51 PM

## 2018-04-26 ENCOUNTER — Other Ambulatory Visit: Payer: Self-pay

## 2018-04-26 ENCOUNTER — Inpatient Hospital Stay (HOSPITAL_COMMUNITY): Payer: Medicaid Other

## 2018-04-26 ENCOUNTER — Encounter (HOSPITAL_COMMUNITY): Payer: Self-pay | Admitting: *Deleted

## 2018-04-26 DIAGNOSIS — R1010 Upper abdominal pain, unspecified: Secondary | ICD-10-CM | POA: Diagnosis not present

## 2018-04-26 DIAGNOSIS — R110 Nausea: Secondary | ICD-10-CM | POA: Diagnosis not present

## 2018-04-26 LAB — COMPREHENSIVE METABOLIC PANEL
ALBUMIN: 2.8 g/dL — AB (ref 3.5–5.0)
ALT: 14 U/L (ref 0–44)
ANION GAP: 8 (ref 5–15)
AST: 18 U/L (ref 15–41)
Alkaline Phosphatase: 103 U/L (ref 38–126)
BILIRUBIN TOTAL: 0.4 mg/dL (ref 0.3–1.2)
BUN: 12 mg/dL (ref 6–20)
CO2: 21 mmol/L — ABNORMAL LOW (ref 22–32)
CREATININE: 0.69 mg/dL (ref 0.44–1.00)
Calcium: 8.8 mg/dL — ABNORMAL LOW (ref 8.9–10.3)
Chloride: 109 mmol/L (ref 98–111)
GFR calc Af Amer: >60 mL/min (ref >60)
GFR calc non Af Amer: >60 mL/min (ref >60)
GLUCOSE: 117 mg/dL — AB (ref 70–99)
Potassium: 4.1 mmol/L (ref 3.5–5.1)
Sodium: 138 mmol/L (ref 135–145)
TOTAL PROTEIN: 5.4 g/dL — AB (ref 6.5–8.1)

## 2018-04-26 LAB — CBC
HCT: 26.4 % — ABNORMAL LOW (ref 36.0–46.0)
Hemoglobin: 8.4 g/dL — ABNORMAL LOW (ref 12.0–15.0)
MCH: 27.5 pg (ref 26.0–34.0)
MCHC: 31.8 g/dL (ref 30.0–36.0)
MCV: 86.3 fL (ref 78.0–100.0)
PLATELETS: 249 10*3/uL (ref 150–400)
RBC: 3.06 MIL/uL — ABNORMAL LOW (ref 3.87–5.11)
RDW: 14.8 % (ref 11.5–15.5)
WBC: 9.7 10*3/uL (ref 4.0–10.5)

## 2018-04-26 LAB — URINALYSIS, ROUTINE W REFLEX MICROSCOPIC
BILIRUBIN URINE: NEGATIVE
Bacteria, UA: NONE SEEN
Glucose, UA: NEGATIVE mg/dL
Ketones, ur: NEGATIVE mg/dL
Nitrite: NEGATIVE
Protein, ur: 100 mg/dL — AB
RBC / HPF: >50 RBC/hpf — ABNORMAL HIGH (ref 0–5)
SPECIFIC GRAVITY, URINE: 1.009 (ref 1.005–1.030)
WBC, UA: >50 WBC/hpf — ABNORMAL HIGH (ref 0–5)
pH: 7 (ref 5.0–8.0)

## 2018-04-26 LAB — LIPASE, BLOOD: Lipase: 61 U/L — ABNORMAL HIGH (ref 11–51)

## 2018-04-26 LAB — AMYLASE: AMYLASE: 81 U/L (ref 28–100)

## 2018-04-26 MED ORDER — OMEPRAZOLE 20 MG PO CPDR: 20.0000 mg | DELAYED_RELEASE_CAPSULE | Freq: Every day | ORAL | 1 refills | Status: DC

## 2018-04-26 NOTE — Discharge Instructions (Signed)

## 2018-04-26 NOTE — MAU Provider Note (Addendum)
Chief Complaint: Abdominal Pain and Nausea   SUBJECTIVE HPI: [name] [name] is a 22 y.o. G2P2002 at Unknown who presents to MAU Had svd yesterday with not problems at PPD#1 with an uneventful postpartum course. Pt wanted early discharge to home with baby then baby jaundice increased and baby stayed in room and pt now rooming in , but still wanted the discharge.  Pt Has had upper abd pains off and on all day. This afternoon woke up and was having upper abd pain and nausea. NO vomiting. Pain there whether I eat or not. Sometimes chest feels tight and like heart is racing. (Pt's baby in NICU due to jaundice. Pt requested d/c today but is staying in hospital due to baby in NICU). Pt denies any hemoptysis, no cough, no n, v, d, rashes, fever, cp or sob now. Pt had had a h/o gastritis and gerd, but denies this feeling that exact way. Pt Has been taking motrin, but denies taking it on an empty stomach.   Denies vision changes, no h/o preeclampsia. Pt is morbidly obese.   Past Medical History:  Diagnosis Date  . Anemia   . Fibroadenoma of left breast    needs follow up 1/20  . Fibroid   . Headache   . Medical history non-contributory   . Streptococcus B carrier or suspected carrier    OB History  Gravida Para Term Preterm AB Living  2 2 2  0 0 2  SAB TAB Ectopic Multiple Live Births  0 0 0 0 2    # Outcome Date GA Lbr Len/2nd Weight Sex Delivery Anes PTL Lv  2 Term 04/24/18 [redacted]w[redacted]d 05:21 / 00:13 4451 g F Vag-Spont EPI  LIV  1 Term 11/02/15 [redacted]w[redacted]d 22:01 / 01:05 3710 g M Vag-Spont EPI  LIV   Past Surgical History:  Procedure Laterality Date  . NO PAST SURGERIES     Social History   Socioeconomic History  . Marital status: Single    Spouse name: Not on file  . Number of children: 1  . Years of education: 2 yrs coll  . Highest education level: Not on file  Occupational History  . Occupation: Secondary school teacher  Social Needs  . Financial resource strain: Not hard at all  . Food insecurity:     Worry: Never true    Inability: Never true  . Transportation needs:    Medical: No    Non-medical: No  Tobacco Use  . Smoking status: Former Smoker    Last attempt to quit: 10/25/2010    Years since quitting: 7.5  . Smokeless tobacco: Never Used  Substance and Sexual Activity  . Alcohol use: No    Comment: socially  . Drug use: No  . Sexual activity: Yes    Birth control/protection: None  Lifestyle  . Physical activity:    Days per week: 0 days    Minutes per session: 0 min  . Stress: Not at all  Relationships  . Social connections:    Talks on phone: Once a week    Gets together: Once a week    Attends religious service: Never    Active member of club or organization: No    Attends meetings of clubs or organizations: Never    Relationship status: Living with partner  . Intimate partner violence:    Fear of current or ex partner: No    Emotionally abused: No    Physically abused: No    Forced sexual activity: No  Other Topics Concern  . Not on file  Social History Narrative   Lives at home with boyfriend and son.   Right-handed.   2-4 cups caffeine daily (unsweet tea).   No current facility-administered medications on file prior to encounter.    Current Outpatient Medications on File Prior to Encounter  Medication Sig Dispense Refill  . acetaminophen (TYLENOL) 500 MG tablet Take 500 mg by mouth every 6 (six) hours as needed for headache.    . ibuprofen (ADVIL,MOTRIN) 600 MG tablet Take 1 tablet (600 mg total) by mouth every 6 (six) hours. 30 tablet 0  . Prenatal Vit-Fe Fumarate-FA (PRENATAL MULTIVITAMIN) TABS tablet Take 1 tablet by mouth daily at 12 noon.    . ferrous sulfate 325 (65 FE) MG tablet Take 1 tablet (325 mg total) by mouth 2 (two) times daily with a meal. 60 tablet 3   No Known Allergies  I have reviewed the past Medical Hx, Surgical Hx, Social Hx, Allergies and Medications.   REVIEW OF SYSTEMS All systems reviewed and are negative for acute  change except as noted in the HPI.   OBJECTIVE BP (!) 143/67 (BP Location: Right Arm)   Pulse 95   Temp 98.8 F (37.1 C) (Oral)   Resp 19   Ht 5\' 7"  (1.702 m)   Wt 131.1 kg   SpO2 100%   Breastfeeding? No   BMI 45.26 kg/m    PHYSICAL EXAM Constitutional: Well-developed, well-nourished female in no acute distress.  Cardiovascular: normal rate and rhythm, pulses intact Respiratory: normal rate and effort.  GI: Abd soft, non-tender, non-distended. Pos BS x 4, negative murphys sign.  MS: Extremities nontender, mild +1 pitting  Edema in bilaterally extremitites, normal ROM Neurologic: Alert and oriented x 4. No focal deficits GU: Neg CVAT. Psych: Flat affect  LAB RESULTS Results for orders placed or performed during the hospital encounter of 04/25/18 (from the past 24 hour(s))  Urinalysis, Routine w reflex microscopic     Status: Abnormal   Collection Time: 04/25/18 11:40 PM  Result Value Ref Range   Color, Urine AMBER (A) YELLOW   APPearance CLOUDY (A) CLEAR   Specific Gravity, Urine 1.009 1.005 - 1.030   pH 7.0 5.0 - 8.0   Glucose, UA NEGATIVE NEGATIVE mg/dL   Hgb urine dipstick LARGE (A) NEGATIVE   Bilirubin Urine NEGATIVE NEGATIVE   Ketones, ur NEGATIVE NEGATIVE mg/dL   Protein, ur 100 (A) NEGATIVE mg/dL   Nitrite NEGATIVE NEGATIVE   Leukocytes, UA MODERATE (A) NEGATIVE   RBC / HPF >50 (H) 0 - 5 RBC/hpf   WBC, UA >50 (H) 0 - 5 WBC/hpf   Bacteria, UA NONE SEEN NONE SEEN   Squamous Epithelial / LPF 11-20 0 - 5   WBC Clumps PRESENT    Mucus PRESENT   Comprehensive metabolic panel     Status: Abnormal   Collection Time: 04/26/18 12:07 AM  Result Value Ref Range   Sodium 138 135 - 145 mmol/L   Potassium 4.1 3.5 - 5.1 mmol/L   Chloride 109 98 - 111 mmol/L   CO2 21 (L) 22 - 32 mmol/L   Glucose, Bld 117 (H) 70 - 99 mg/dL   BUN 12 6 - 20 mg/dL   Creatinine, Ser 0.69 0.44 - 1.00 mg/dL   Calcium 8.8 (L) 8.9 - 10.3 mg/dL   Total Protein 5.4 (L) 6.5 - 8.1 g/dL   Albumin  2.8 (L) 3.5 - 5.0 g/dL   AST 18 15 - 41 U/L  ALT 14 0 - 44 U/L   Alkaline Phosphatase 103 38 - 126 U/L   Total Bilirubin 0.4 0.3 - 1.2 mg/dL   GFR calc non Af Amer >60 >60 mL/min   GFR calc Af Amer >60 >60 mL/min   Anion gap 8 5 - 15  Lipase, blood     Status: Abnormal   Collection Time: 04/26/18 12:07 AM  Result Value Ref Range   Lipase 61 (H) 11 - 51 U/L  CBC     Status: Abnormal   Collection Time: 04/26/18 12:07 AM  Result Value Ref Range   WBC 9.7 4.0 - 10.5 K/uL   RBC 3.06 (L) 3.87 - 5.11 MIL/uL   Hemoglobin 8.4 (L) 12.0 - 15.0 g/dL   HCT 26.4 (L) 36.0 - 46.0 %   MCV 86.3 78.0 - 100.0 fL   MCH 27.5 26.0 - 34.0 pg   MCHC 31.8 30.0 - 36.0 g/dL   RDW 14.8 11.5 - 15.5 %   Platelets 249 150 - 400 K/uL  Amylase     Status: None   Collection Time: 04/26/18 12:07 AM  Result Value Ref Range   Amylase 81 28 - 100 U/L    IMAGING US Abdomen Complete  Result Date: 04/26/2018 CLINICAL DATA:  22 year old female with upper abdominal pain and nausea. EXAM: ABDOMEN ULTRASOUND COMPLETE COMPARISON:  None. FINDINGS: Gallbladder: No gallstones or wall thickening visualized. No sonographic Murphy sign noted by sonographer. Common bile duct: Diameter: 5 mm Liver: No focal lesion identified. Within normal limits in parenchymal echogenicity. Portal vein is patent on color Doppler imaging with normal direction of blood flow towards the liver. IVC: No abnormality visualized. Pancreas: Visualized portion unremarkable. Spleen: Size and appearance within normal limits. Right Kidney: Length: 12 cm. Echogenicity within normal limits. No mass or hydronephrosis visualized. Left Kidney: Length: 12 cm. Echogenicity within normal limits. No mass or hydronephrosis visualized. Abdominal aorta: No aneurysm visualized. Other findings: None. IMPRESSION: Unremarkable abdominal ultrasound. Electronically Signed   By: Anner Crete M.D.   On: 04/26/2018 03:05    MAU Management/MDM: Vitals and nursing notes  reviewed Orders Placed This Encounter  Procedures  . US Abdomen Complete  . Urinalysis, Routine w reflex microscopic  . Comprehensive metabolic panel  . Lipase, blood  . CBC  . Amylase  . Diet - low sodium heart healthy  . Increase activity slowly  . Call MD for:  . Call MD for:  temperature >100.4  . Call MD for:  persistant nausea and vomiting  . Call MD for:  severe uncontrolled pain  . Call MD for:  redness, tenderness, or signs of infection (pain, swelling, redness, odor or green/yellow discharge around incision site)  . Call MD for:  difficulty breathing, headache or visual disturbances  . Call MD for:  hives  . Call MD for:  persistant dizziness or light-headedness  . Call MD for:  extreme fatigue  . (HEART FAILURE PATIENTS) Call MD:  Anytime you have any of the following symptoms: 1) 3 pound weight gain in 24 hours or 5 pounds in 1 week 2) shortness of breath, with or without a dry hacking cough 3) swelling in the hands, feet or stomach 4) if you have to sleep on extra pillows at night in order to breathe.  . ED EKG  . Discharge patient Discharge disposition: 01-Home or Self Care; Discharge patient date: 04/26/2018    Meds ordered this encounter  Medications  . omeprazole (PRILOSEC) 20 MG capsule  Sig: Take 1 capsule (20 mg total) by mouth daily.    Dispense:  30 capsule    Refill:  1    Order Specific Question:   Supervising Provider    Answer:   Everett Graff [[id number]]    Plan of care reviewed with patient, including labs and tests ordered and medical treatment.  ASSESSMENT [name] [name] is a 22 y.o. [id number] PPD#1 from a SVD, discharged today per pts request for early discharge, infant in room and rooming in. Pt had upper abdominal pain, around her upper abdomen, no radiates to back, not pain now, pain resolved. CMP, CBC WNL except anemia at hgb of 8.4 down from 8.8, normal liver enzymes, amylase 81 wnl, lipase slightly elevated at 61, ordered US abdomen, which  result preliminary results show all WNL. No gallstone noted. Clinical impression is gastritis with gerd. Looking at preeclampsia labs were negative.   Unable to give GI cocktail due to pain has resolved.   1. Epigastric pain   2. Abdominal pain   3. Acute superficial gastritis without hemorrhage     PLAN Discharge home in stable condition. Gastritis/GERD: Start prilosec 20 mg daily, bland diet.  Pt discharged with strict pain precautions. Counseled on return precautions Handout given [facility] Follow up in 1 week(s).   Specialty:  Obstetrics and Gynecology Contact information: [address] [place] [address] [phone]          Allergies as of 04/26/2018   No Known Allergies     Medication List    TAKE these medications   acetaminophen 500 MG tablet Commonly known as:  TYLENOL Take 500 mg by mouth every 6 (six) hours as needed for headache.   ferrous sulfate 325 (65 FE) MG tablet Take 1 tablet (325 mg total) by mouth 2 (two) times daily with a meal.   ibuprofen 600 MG tablet Commonly known as:  ADVIL,MOTRIN Take 1 tablet (600 mg total) by mouth every 6 (six) hours.   omeprazole 20 MG capsule Commonly known as:  PRILOSEC Take 1 capsule (20 mg total) by mouth daily.   prenatal multivitamin Tabs tablet Take 1 tablet by mouth daily at 12 noon.       Fareedah Mahler 04/26/2018, 3:25 AM

## 2018-05-01 ENCOUNTER — Encounter (HOSPITAL_COMMUNITY): Payer: Self-pay | Admitting: *Deleted

## 2018-05-01 ENCOUNTER — Emergency Department (HOSPITAL_COMMUNITY)
Admission: EM | Admit: 2018-05-01 | Discharge: 2018-05-01 | Disposition: A | Discharge status: Home / Self Care | Payer: Medicaid Other | Attending: Emergency Medicine | Admitting: Emergency Medicine

## 2018-05-01 DIAGNOSIS — R109 Unspecified abdominal pain: Secondary | ICD-10-CM

## 2018-05-01 DIAGNOSIS — Z79899 Other long term (current) drug therapy: Secondary | ICD-10-CM | POA: Insufficient documentation

## 2018-05-01 DIAGNOSIS — Z87891 Personal history of nicotine dependence: Secondary | ICD-10-CM | POA: Insufficient documentation

## 2018-05-01 DIAGNOSIS — O9989 Other specified diseases and conditions complicating pregnancy, childbirth and the puerperium: Secondary | ICD-10-CM | POA: Diagnosis not present

## 2018-05-01 DIAGNOSIS — R1030 Lower abdominal pain, unspecified: Secondary | ICD-10-CM | POA: Diagnosis not present

## 2018-05-01 LAB — URINALYSIS, ROUTINE W REFLEX MICROSCOPIC
BACTERIA UA: NONE SEEN
Bilirubin Urine: NEGATIVE
Glucose, UA: NEGATIVE mg/dL
KETONES UR: NEGATIVE mg/dL
Nitrite: NEGATIVE
Protein, ur: 100 mg/dL — AB
RBC / HPF: >50 RBC/hpf — ABNORMAL HIGH (ref 0–5)
Specific Gravity, Urine: 1.012 (ref 1.005–1.030)
pH: 8 (ref 5.0–8.0)

## 2018-05-01 LAB — CBC
HEMATOCRIT: 33.7 % — AB (ref 36.0–46.0)
Hemoglobin: 10.3 g/dL — ABNORMAL LOW (ref 12.0–15.0)
MCH: 27.1 pg (ref 26.0–34.0)
MCHC: 30.6 g/dL (ref 30.0–36.0)
MCV: 88.7 fL (ref 78.0–100.0)
Platelets: 379 10*3/uL (ref 150–400)
RBC: 3.8 MIL/uL — ABNORMAL LOW (ref 3.87–5.11)
RDW: 14.6 % (ref 11.5–15.5)
WBC: 6.8 10*3/uL (ref 4.0–10.5)

## 2018-05-01 LAB — COMPREHENSIVE METABOLIC PANEL
ALBUMIN: 3.4 g/dL — AB (ref 3.5–5.0)
ALT: 36 U/L (ref 0–44)
ANION GAP: 12 (ref 5–15)
AST: 25 U/L (ref 15–41)
Alkaline Phosphatase: 85 U/L (ref 38–126)
BILIRUBIN TOTAL: 0.5 mg/dL (ref 0.3–1.2)
BUN: 9 mg/dL (ref 6–20)
CALCIUM: 9.2 mg/dL (ref 8.9–10.3)
CO2: 20 mmol/L — ABNORMAL LOW (ref 22–32)
Chloride: 109 mmol/L (ref 98–111)
Creatinine, Ser: 0.72 mg/dL (ref 0.44–1.00)
GFR calc Af Amer: >60 mL/min (ref >60)
GFR calc non Af Amer: >60 mL/min (ref >60)
GLUCOSE: 83 mg/dL (ref 70–99)
POTASSIUM: 4.3 mmol/L (ref 3.5–5.1)
Sodium: 141 mmol/L (ref 135–145)
TOTAL PROTEIN: 6.5 g/dL (ref 6.5–8.1)

## 2018-05-01 NOTE — ED Provider Notes (Signed)
[facility] EMERGENCY DEPARTMENT Provider Note   CSN: [id number] Arrival date & time: 05/01/18  0017     History   Chief Complaint Chief Complaint  Patient presents with  . Abdominal Cramping    HPI [name] [name] is a 22 y.o. female G2P2 here for evaluation of lower abdominal pain.  Onset 45 mins PTA.  Described as contraction type cramping to mid lower abdomen, excruciating.  Initially constant for 30 mins, but slightly improved intermittent lasting 5-10 mins at a time.  Worse with laying down. Associated with rectal pressure.  No alleviating or aggravating factors. 7 days s/p uncomplicated vaginal delivery of one live single infant.  Has had vaginal bleeding since delivery with clots, without significant change or increase.    No fevers, chills, nausea, vomiting, dysuria, frequency, constipation, diarrhea. No sexual intercourse. H/o 2 pregnancies 2 vaginal deliveries, uncomplicated.  No vaginal tears or sutures during delivery. No interventions  HPI  Past Medical History:  Diagnosis Date  . Anemia   . Fibroadenoma of left breast    needs follow up 1/20  . Fibroid   . Headache   . Medical history non-contributory   . Streptococcus B carrier or suspected carrier     Patient Active Problem List   Diagnosis Date Noted  . Normal postpartum course 04/25/2018  . Postpartum anemia 04/25/2018  . SVD (spontaneous vaginal delivery) 04/24/2018  . Obesity affecting pregnancy in third trimester 04/23/2018  . Chronic migraine 12/13/2015  . Low back pain 12/13/2015  . Normal vaginal delivery 11/02/2015  . Obesity (BMI 35.0-39.9 without comorbidity) 11/01/2015  . Abnormal fetal heart rate or rhythm (FHR) 11/01/2015  . Post term pregnancy over 40 weeks 11/01/2015    Past Surgical History:  Procedure Laterality Date  . NO PAST SURGERIES       OB History    Gravida  2   Para  2   Term  2   Preterm  0   AB  0   Living  2     SAB  0   TAB  0   Ectopic  0   Multiple  0   Live Births  2            Home Medications    Prior to Admission medications   Medication Sig Start Date End Date Taking? Authorizing Provider  acetaminophen (TYLENOL) 500 MG tablet Take 500 mg by mouth every 6 (six) hours as needed for headache.   Yes [provider]  ferrous sulfate 325 (65 FE) MG tablet Take 1 tablet (325 mg total) by mouth 2 (two) times daily with a meal. 04/25/18 04/25/19 Yes [place], Jade, FNP  ibuprofen (ADVIL,MOTRIN) 600 MG tablet Take 1 tablet (600 mg total) by mouth every 6 (six) hours. 04/25/18  Yes [place], Jade, FNP  omeprazole (PRILOSEC) 20 MG capsule Take 1 capsule (20 mg total) by mouth daily. Patient not taking: Reported on 05/01/2018 04/26/18 04/26/19  Noralyn Pick, FNP    Family History Family History  Problem Relation Age of Onset  . Healthy Mother   . Hypertension Father     Social History Social History   Tobacco Use  . Smoking status: Former Smoker    Last attempt to quit: 10/25/2010    Years since quitting: 7.5  . Smokeless tobacco: Never Used  Substance Use Topics  . Alcohol use: No    Comment: socially  . Drug use: No     Allergies   Patient  has no known allergies.   Review of Systems Review of Systems  Gastrointestinal: Positive for abdominal pain.  Genitourinary: Positive for vaginal bleeding.  All other systems reviewed and are negative.    Physical Exam Updated Vital Signs BP 137/86   Pulse 84   Temp 97.8 F (36.6 C) (Oral)   Resp 18   SpO2 98%   Physical Exam  Constitutional: She is oriented to person, place, and time. She appears well-developed and well-nourished.  Non toxic  HENT:  Head: Normocephalic and atraumatic.  Nose: Nose normal.  Eyes: Pupils are equal, round, and reactive to light. Conjunctivae and EOM are normal.  Neck: Normal range of motion.  Cardiovascular: Normal rate and regular rhythm.  Pulmonary/Chest: Effort normal and breath sounds normal.    Abdominal: Soft. Bowel sounds are normal. There is tenderness.  Diffuse lower abdominal tenderness most significant at suprapubic area.  Negative Murphy's and McBurney's. No G/R/R. No CVA tenderness.    Musculoskeletal: Normal range of motion.  Neurological: She is alert and oriented to person, place, and time.  Skin: Skin is warm and dry. Capillary refill takes less than 2 seconds.  Psychiatric: She has a normal mood and affect. Her behavior is normal.  Nursing note and vitals reviewed.    ED Treatments / Results  Labs (all labs ordered are listed, but only abnormal results are displayed) Labs Reviewed  COMPREHENSIVE METABOLIC PANEL - Abnormal; Notable for the following components:      Result Value   CO2 20 (*)    Albumin 3.4 (*)    All other components within normal limits  CBC - Abnormal; Notable for the following components:   RBC 3.80 (*)    Hemoglobin 10.3 (*)    HCT 33.7 (*)    All other components within normal limits  URINALYSIS, ROUTINE W REFLEX MICROSCOPIC - Abnormal; Notable for the following components:   Color, Urine RED (*)    APPearance CLOUDY (*)    Hgb urine dipstick LARGE (*)    Protein, ur 100 (*)    Leukocytes, UA LARGE (*)    RBC / HPF >50 (*)    All other components within normal limits  URINE CULTURE    EKG None  Radiology No results found.  Procedures Procedures (including critical care time)  Medications Ordered in ED Medications - No data to display   Initial Impression / Assessment and Plan / ED Course  I have reviewed the triage vital signs and the nursing notes.  Pertinent labs & imaging results that were available during my care of the patient were reviewed by me and considered in my medical decision making (see chart for details).  Clinical Course as of May 01 1618  Thu May 01, 2018  1206 H/o uterine fibroid left    [CG]    Clinical Course User Index [CG] Kinnie Feil, PA-C    Considering retained products vs UTI.  Negative murphy's and mcburney's making cholecystitis and appy less likely. Kidney stone also on differential. Pending UA and pelvic exam.   1350: Pt requesting to leave due to delay in UA, pelvic and [place].  I apologized for wait time and explained we had critical patients check in.  EMT and RN also involved in critical pt care.  She verbalized understanding of this.  Recommended she stay to perform pelvic and US/CT renal however she opted for discharge against medical advice.  Her husband has to work.  Discussed risks of leaving without completion of  work up in the ER including worsening pain, disability or heavier vaginal bleeding putting at risk for critical illness and death. She understands. She has an appt with OBGYN tomorrow at 10 am.  Urine at bedside, will send to UA and i'll f/u with results.  Pt in agreement. Discussed return precautions.   1615: UA results without nitrites, large hgb, large leuks, 21-50 WBC but no bacteria.  I called pt and told her results were equivocal however given suprapubic pain, may be suggestive of UTI.  Abd cramping improved since ER. Urine culture sent. She sees Donnel Saxon [facility]) for appointment tomorrow morning, will send a private message to include today's UA results to facilitate completion of care.   Final Clinical Impressions(s) / ED Diagnoses   Final diagnoses:  Abdominal cramping    ED Discharge Orders    None       Kinnie Feil, PA-C 05/01/18 1619    Little, Wenda Overland, MD 05/02/18 1515

## 2018-05-01 NOTE — ED Triage Notes (Signed)
Pt in c/o acute onset of  abd cramping while driving today, pt had vaginal delivery x 7 days at full term, pt reports increase in vaginal bleeding since onset of pain 45 minutes ago, pt A&O x4

## 2018-05-01 NOTE — Discharge Instructions (Addendum)
You were seen in the ER for abdominal cramping.  Unfortunately, we could not do a pelvic exam or ultrasound. You requested discharge without completion of work up and left against medical advice.  We could not exclude pelvic or abdominal emergencies today without complete work up.   Follow up with your OBGYN tomorrow  Return for evaluation

## 2018-05-02 DIAGNOSIS — D649 Anemia, unspecified: Secondary | ICD-10-CM | POA: Diagnosis not present

## 2018-05-03 LAB — URINE CULTURE

## 2018-05-21 ENCOUNTER — Other Ambulatory Visit: Payer: Self-pay | Admitting: Obstetrics and Gynecology

## 2018-05-21 DIAGNOSIS — R1011 Right upper quadrant pain: Secondary | ICD-10-CM | POA: Diagnosis not present

## 2018-05-21 DIAGNOSIS — R1084 Generalized abdominal pain: Secondary | ICD-10-CM

## 2018-05-27 ENCOUNTER — Ambulatory Visit
Admission: RE | Admit: 2018-05-27 | Discharge: 2018-05-27 | Disposition: A | Discharge status: Home / Self Care | Payer: Medicaid Other | Source: Ambulatory Visit | Attending: Obstetrics and Gynecology | Admitting: Obstetrics and Gynecology

## 2018-05-27 DIAGNOSIS — K769 Liver disease, unspecified: Secondary | ICD-10-CM | POA: Diagnosis not present

## 2018-05-27 DIAGNOSIS — R1084 Generalized abdominal pain: Secondary | ICD-10-CM

## 2018-05-28 DIAGNOSIS — D649 Anemia, unspecified: Secondary | ICD-10-CM | POA: Diagnosis not present

## 2018-05-28 DIAGNOSIS — R748 Abnormal levels of other serum enzymes: Secondary | ICD-10-CM | POA: Diagnosis not present

## 2018-05-28 DIAGNOSIS — Z09 Encounter for follow-up examination after completed treatment for conditions other than malignant neoplasm: Secondary | ICD-10-CM | POA: Diagnosis not present

## 2018-06-05 DIAGNOSIS — R945 Abnormal results of liver function studies: Secondary | ICD-10-CM | POA: Diagnosis not present

## 2018-06-05 DIAGNOSIS — D649 Anemia, unspecified: Secondary | ICD-10-CM | POA: Diagnosis not present

## 2018-06-05 DIAGNOSIS — M25551 Pain in right hip: Secondary | ICD-10-CM | POA: Insufficient documentation

## 2018-06-06 DIAGNOSIS — R945 Abnormal results of liver function studies: Secondary | ICD-10-CM | POA: Insufficient documentation

## 2018-06-06 DIAGNOSIS — R7989 Other specified abnormal findings of blood chemistry: Secondary | ICD-10-CM | POA: Insufficient documentation

## 2018-07-14 ENCOUNTER — Encounter: Payer: Self-pay | Admitting: Family Medicine

## 2018-07-14 ENCOUNTER — Ambulatory Visit (INDEPENDENT_AMBULATORY_CARE_PROVIDER_SITE_OTHER): Payer: 59 | Admitting: Family Medicine

## 2018-07-14 ENCOUNTER — Ambulatory Visit (INDEPENDENT_AMBULATORY_CARE_PROVIDER_SITE_OTHER): Payer: 59

## 2018-07-14 VITALS — BP 124/84 | HR 76 | Temp 97.6°F | Ht 67.0 in | Wt 279.6 lb

## 2018-07-14 DIAGNOSIS — M25512 Pain in left shoulder: Secondary | ICD-10-CM

## 2018-07-14 MED ORDER — DICLOFENAC SODIUM 1 % TD GEL: 4.0000 g | Freq: Four times a day (QID) | TRANSDERMAL | 1 refills | Status: DC

## 2018-07-14 MED ORDER — CYCLOBENZAPRINE HCL 10 MG PO TABS: 10.0000 mg | ORAL_TABLET | Freq: Three times a day (TID) | ORAL | 0 refills | Status: DC | PRN

## 2018-07-14 NOTE — Progress Notes (Signed)
Patient: [name] [name] MRN: [id number] DOB: 10-27-1995 PCP: Orma Flaming, MD     Subjective:  Chief Complaint  Patient presents with  . Establish Care  . pain in L shoulder (concerned about torn rotator cuff)    HPI: The patient is a 22 y.o. female who presents today for establishing care and pain in her left shoulder joint. She first noticed the shoulder pain about 2 weeks ago. She noticed it while sleeping on her left shoulder. It was painful and she had pain while sleeping on it. She has pain with behind the back movement, over the head and adduction.  She has not taken anything for the pain and is not breast feeding. (has a 62.16 month old). She has never had any trauma to this shoulder. She is right hand predominant. Nothing makes it better. Gets worse at night. Holds baby on both right and left side. She does not heavy lifting at work.   Review of Systems  Constitutional: Negative for fatigue.  Respiratory: Negative for shortness of breath.   Cardiovascular: Negative for chest pain.  Gastrointestinal: Negative for abdominal pain, constipation, diarrhea and nausea.  Musculoskeletal: Positive for arthralgias and neck stiffness. Negative for back pain and neck pain.       Left sided neck pain, left shoulder pain  Skin: Negative.   Neurological: Positive for headaches. Negative for dizziness.  Psychiatric/Behavioral: Negative for dysphoric mood and sleep disturbance. The patient is not nervous/anxious.     Allergies Patient has No Known Allergies.  Past Medical History Patient  has a past medical history of Anemia, Fibroadenoma of left breast, Fibroid, Headache, Medical history non-contributory, and Streptococcus B carrier or suspected carrier.  Surgical History Patient  has a past surgical history that includes No past surgeries.  Family History Pateint's family history includes Healthy in her mother; Hypertension in her father.  Social History Patient  reports that she  quit smoking about 7 years ago. She has never used smokeless tobacco. She reports that she does not drink alcohol or use drugs.    Objective: Vitals:   07/14/18 1430  BP: 124/84  Pulse: 76  Temp: 97.6 F (36.4 C)  TempSrc: Oral  SpO2: 99%  Weight: 279 lb 9.6 oz (126.8 kg)  Height: 5\' 7"  (1.702 m)    Body mass index is 43.79 kg/m.  Physical Exam  Constitutional: She appears well-developed and well-nourished.  obese  HENT:  Right Ear: External ear normal.  Left Ear: External ear normal.  Mouth/Throat: Oropharynx is clear and moist. No oropharyngeal exudate.  Tm pearly with light reflex bilaterally    Neck: Normal range of motion. Neck supple. No thyromegaly present.  Cardiovascular: Normal rate, regular rhythm and normal heart sounds.  Pulmonary/Chest: Effort normal and breath sounds normal.  Abdominal: Soft. Bowel sounds are normal.  Musculoskeletal: Normal range of motion. She exhibits tenderness. She exhibits no edema or deformity.  Left shoulder: ROM wnl, but has pain in anterior/superior aspect of humeral joint with overhead and gerber. Negative neers/speeds test. No erythema/edema. Strength and hand grip intact.   Lymphadenopathy:    She has no cervical adenopathy.  Vitals reviewed.  Shoulder xray: no acute finding, read pending.     Assessment/plan: 1. Acute pain of left shoulder Appears more muscle as her neck is really tight. Will do stretches, tylenol prn as she has gastritis and voltaren gel. Muscle relaxer prn. Drowsy precautions given. She requested f/u with ortho and this was placed. Would see them if not getting  better in 1-2 weeks.  - DG Shoulder Left; Future - Ambulatory referral to Orthopedics    Depression screen [facility] 2/9 07/14/2018 08/26/2015  Decreased Interest 0 0  Down, Depressed, Hopeless 0 0  PHQ - 2 Score 0 0     Return if symptoms worsen or fail to improve.    Orma Flaming, MD [place]   07/14/2018

## 2018-07-14 NOTE — Patient Instructions (Signed)
Start using voltaren gel up to four times a day for the pain and tylenol prn.   Sending in muscle relaxer to take up to three times a day. May make you really sleepy so may want to just take 1/2 a pill.   F/u with dr. Paulla Fore in 1-2 weeks if not better

## 2018-07-14 NOTE — Progress Notes (Deleted)
Patient: [name] [name] MRN: [id number] DOB: 1996-04-20 PCP: Orma Flaming, MD     Subjective:  No chief complaint on file.   HPI: The patient is a 22 y.o. female who presents today for annual exam. {He/she (caps):30048} denies any changes to past medical history. There have been no recent hospitalizations. They {Actions; are/are not:16769} following a well balanced diet and exercise plan. Weight has been {trend:16658}. No complaints today.   There is no immunization history for the selected administration types on file for this patient. Colonoscopy: Mammogram:  Pap smear:  PSA:   Review of Systems  Allergies Patient has No Known Allergies.  Past Medical History Patient  has a past medical history of Anemia, Fibroadenoma of left breast, Fibroid, Headache, Medical history non-contributory, and Streptococcus B carrier or suspected carrier.  Surgical History Patient  has a past surgical history that includes No past surgeries.  Family History Pateint's family history includes Healthy in her mother; Hypertension in her father.  Social History Patient  reports that she quit smoking about 7 years ago. She has never used smokeless tobacco. She reports that she does not drink alcohol or use drugs.    Objective: There were no vitals filed for this visit.  There is no height or weight on file to calculate BMI.  Physical Exam     Assessment/plan:   No problem-specific Assessment & Plan notes found for this encounter.    No follow-ups on file.     Orma Flaming, MD [place]  07/14/2018

## 2018-07-16 ENCOUNTER — Telehealth: Payer: Self-pay | Admitting: Family Medicine

## 2018-07-16 ENCOUNTER — Other Ambulatory Visit: Payer: Self-pay

## 2018-07-16 DIAGNOSIS — M25512 Pain in left shoulder: Secondary | ICD-10-CM

## 2018-07-16 NOTE — Telephone Encounter (Signed)
Referral to ortho cancelled and internal referral placed to Dr. Paulla Fore.  Dx: left shoulder pain

## 2018-07-16 NOTE — Addendum Note (Signed)
Addended by: Orma Flaming on: 07/16/2018 04:08 PM   Modules accepted: Orders

## 2018-07-16 NOTE — Telephone Encounter (Signed)
See note. Referral to ortho has been cancelled.   Copied from [place] [phone]. Topic: Referral - Question >> Jul 16, 2018 10:16 AM Rayann Heman wrote: Reason for CRM: pt called and stated that she would like to cancel referral for ortho and have an ultrasound of shoulder done in office. Please advise

## 2018-07-23 ENCOUNTER — Ambulatory Visit: Payer: 59 | Admitting: Sports Medicine

## 2018-07-31 ENCOUNTER — Ambulatory Visit: Payer: Self-pay | Admitting: Nurse Practitioner

## 2018-07-31 ENCOUNTER — Encounter: Payer: Self-pay | Admitting: Family Medicine

## 2018-07-31 ENCOUNTER — Ambulatory Visit (INDEPENDENT_AMBULATORY_CARE_PROVIDER_SITE_OTHER): Payer: 59 | Admitting: Family Medicine

## 2018-07-31 VITALS — BP 116/72 | HR 87 | Temp 98.0°F | Ht 67.0 in | Wt 280.0 lb

## 2018-07-31 DIAGNOSIS — R0982 Postnasal drip: Secondary | ICD-10-CM | POA: Diagnosis not present

## 2018-07-31 DIAGNOSIS — J069 Acute upper respiratory infection, unspecified: Secondary | ICD-10-CM

## 2018-07-31 LAB — POCT RAPID STREP A (OFFICE): Rapid Strep A Screen: NEGATIVE

## 2018-07-31 MED ORDER — FLUTICASONE PROPIONATE 50 MCG/ACT NA SUSP: 2.0000 | Freq: Every day | NASAL | 6 refills | Status: DC

## 2018-07-31 NOTE — Progress Notes (Signed)
Patient: [name] [name] MRN: [id number] DOB: [birth year] PCP: Orma Flaming, MD     Subjective:  Chief Complaint  Patient presents with  . Sore Throat  . Torticollis    HPI: The patient is a 22 y.o. female who presents today for sore throat and neck pain. She has been sick x 1 week. She states on Monday her neck got stiff and she couldn't really move it. She feels like she has a cold. Her neck is better and just feels sore at the base of her skull. No headaches.  She is not stiff or achy anywhere else. No fever/chills. She has a sore throat in the AM when she wakes up and she will cough up a lot of phlegm. She will have a dry cough throughout the day. She will have some mucous come out when she blows her nose. She has no sinus pain or pressure. No ear pain. No wheezing and very little shortness of breath. She has not taken anything over the counter. Both of her kids have been sick at home.   Review of Systems  Constitutional: Positive for fatigue. Negative for chills and fever.  HENT: Positive for congestion, postnasal drip, rhinorrhea and sore throat. Negative for ear pain, sinus pressure and sinus pain.   Respiratory: Positive for cough and shortness of breath.   Cardiovascular: Negative for chest pain.  Gastrointestinal: Negative for abdominal pain, nausea and vomiting.  Musculoskeletal: Positive for neck pain and neck stiffness. Negative for back pain.  Neurological: Negative for dizziness and headaches.  Psychiatric/Behavioral: Positive for sleep disturbance.    Allergies Patient has No Known Allergies.  Past Medical History Patient  has a past medical history of Anemia, Fibroadenoma of left breast, Fibroid, Headache, History of chicken pox, Medical history non-contributory, and Streptococcus B carrier or suspected carrier.  Surgical History Patient  has a past surgical history that includes No past surgeries.  Family History Pateint's family history includes COPD in her  maternal grandmother; Healthy in her mother; Hyperlipidemia in her father; Hypertension in her father; Mental retardation in her brother.  Social History Patient  reports that she quit smoking about 7 years ago. She has never used smokeless tobacco. She reports that she does not drink alcohol or use drugs.    Objective: Vitals:   07/31/18 0941  BP: 116/72  Pulse: 87  Temp: 98 F (36.7 C)  TempSrc: Oral  SpO2: 98%  Weight: 280 lb (127 kg)  Height: 5\' 7"  (1.702 m)    Body mass index is 43.85 kg/m.  Physical Exam Vitals signs reviewed.  Constitutional:      General: She is not in acute distress.    Appearance: She is well-developed. She is obese. She is not ill-appearing.  HENT:     Right Ear: Tympanic membrane and ear canal normal.     Left Ear: Tympanic membrane and ear canal normal.     Nose: No congestion.     Comments: No ttp over sinuses     Mouth/Throat:     Mouth: Mucous membranes are moist.     Pharynx: Posterior oropharyngeal erythema present.     Tonsils: No tonsillar exudate. Swelling: 0 on the right. 0 on the left.     Comments: +cobblestoning on posterior pharynx  Eyes:     Conjunctiva/sclera: Conjunctivae normal.     Pupils: Pupils are equal, round, and reactive to light.  Neck:     Musculoskeletal: Neck supple.     Comments: Full ROM, just "  tight"  Cardiovascular:     Rate and Rhythm: Normal rate and regular rhythm.     Heart sounds: Normal heart sounds.  Pulmonary:     Effort: Pulmonary effort is normal.     Breath sounds: Normal breath sounds.  Abdominal:     General: Bowel sounds are normal.     Palpations: Abdomen is soft.  Lymphadenopathy:     Cervical: No cervical adenopathy.  Skin:    General: Skin is warm.  Neurological:     General: No focal deficit present.     Mental Status: She is alert and oriented to person, place, and time.     Comments: Negative kernigs and Brudzinski         Rapid strep: negative  Assessment/plan: 1.  Post-nasal drip Start flonase nightly. Recommended cool mist humidifier as well to help keep her nose from getting too dry with spray.  - POCT rapid strep A  2. Viral URI Supportive therapy. Ibuprofen and heating pad for neck stiffness, robitusisn DM and honey for cough. Discussed exam reassuring with no signs of bacterial infection. Viral illness can last 7-10 days and discussed conservative therapy with her in detail. Fevers/worsening or prolonged symptoms she is to let me know. Headache with worsening neck pain she is to go to ER.     Return if symptoms worsen or fail to improve.    Orma Flaming, MD [place]   07/31/2018

## 2018-07-31 NOTE — Patient Instructions (Signed)
Ibuprofen 600-800mg  up to three times a day for pain in neck. I would use heating pad as well.  Robitussin DM during day for cough and cool mist humidifier and honey off the spoon/daily  Sending in flonase to help with post nasal drip.    Postnasal Drip Postnasal drip is the feeling of mucus going down the back of your throat. Mucus is a slimy substance that moistens and cleans your nose and throat, as well as the air pockets in face bones near your forehead and cheeks (sinuses). Small amounts of mucus pass from your nose and sinuses down the back of your throat all the time. This is normal. When you produce too much mucus or the mucus gets too thick, you can feel it. Some common causes of postnasal drip include:  Having more mucus because of: ? A cold or the flu. ? Allergies. ? Cold air. ? Certain medicines.  Having more mucus that is thicker because of: ? A sinus or nasal infection. ? Dry air. ? A food allergy. Follow these instructions at home: Relieving discomfort   Gargle with a salt-water mixture 3-4 times a day or as needed. To make a salt-water mixture, completely dissolve -1 tsp of salt in 1 cup of warm water.  If the air in your home is dry, use a humidifier to add moisture to the air.  Use a saline spray or container (neti pot) to flush out the nose (nasal irrigation). These methods can help clear away mucus and keep the nasal passages moist. General instructions  Take over-the-counter and prescription medicines only as told by your health care provider.  Follow instructions from your health care provider about eating or drinking restrictions. You may need to avoid caffeine.  Avoid things that you know you are allergic to (allergens), like dust, mold, pollen, pets, or certain foods.  Drink enough fluid to keep your urine pale yellow.  Keep all follow-up visits as told by your health care provider. This is important. Contact a health care provider if:  You have a  fever.  You have a sore throat.  You have difficulty swallowing.  You have headache.  You have sinus pain.  You have a cough that does not go away.  The mucus from your nose becomes thick and is green or yellow in color.  You have cold or flu symptoms that last more than 10 days. Summary  Postnasal drip is the feeling of mucus going down the back of your throat.  If your health care provider approves, use nasal irrigation or a nasal spray 2?4 times a day.  Avoid things that you know you are allergic to (allergens), like dust, mold, pollen, pets, or certain foods. This information is not intended to replace advice given to you by your health care provider. Make sure you discuss any questions you have with your health care provider. Document Released: 11/05/2016 Document Revised: 11/05/2016 Document Reviewed: 11/05/2016 Elsevier Interactive Patient Education  2019 [employer].   Viral Illness, Adult Viruses are tiny germs that can get into a person's body and cause illness. There are many different types of viruses, and they cause many types of illness. Viral illnesses can range from mild to severe. They can affect various parts of the body. Common illnesses that are caused by a virus include colds and the flu. Viral illnesses also include serious conditions such as HIV/AIDS (human immunodeficiency virus/acquired immunodeficiency syndrome). A few viruses have been linked to certain cancers. What are the  causes? Many types of viruses can cause illness. Viruses invade cells in your body, multiply, and cause the infected cells to malfunction or die. When the cell dies, it releases more of the virus. When this happens, you develop symptoms of the illness, and the virus continues to spread to other cells. If the virus takes over the function of the cell, it can cause the cell to divide and grow out of control, as is the case when a virus causes cancer. Different viruses get into the body  in different ways. You can get a virus by:  Swallowing food or water that is contaminated with the virus.  Breathing in droplets that have been coughed or sneezed into the air by an infected person.  Touching a surface that has been contaminated with the virus and then touching your eyes, nose, or mouth.  Being bitten by an insect or animal that carries the virus.  Having sexual contact with a person who is infected with the virus.  Being exposed to blood or fluids that contain the virus, either through an open cut or during a transfusion. If a virus enters your body, your body's defense system (immune system) will try to fight the virus. You may be at higher risk for a viral illness if your immune system is weak. What are the signs or symptoms? Symptoms vary depending on the type of virus and the location of the cells that it invades. Common symptoms of the main types of viral illnesses include: Cold and flu viruses  Fever.  Headache.  Sore throat.  Muscle aches.  Nasal congestion.  Cough. Digestive system (gastrointestinal) viruses  Fever.  Abdominal pain.  Nausea.  Diarrhea. Liver viruses (hepatitis)  Loss of appetite.  Tiredness.  Yellowing of the skin (jaundice). Brain and spinal cord viruses  Fever.  Headache.  Stiff neck.  Nausea and vomiting.  Confusion or sleepiness. Skin viruses  Warts.  Itching.  Rash. Sexually transmitted viruses  Discharge.  Swelling.  Redness.  Rash. How is this treated? Viruses can be difficult to treat because they live within cells. Antibiotic medicines do not treat viruses because these drugs do not get inside cells. Treatment for a viral illness may include:  Resting and drinking plenty of fluids.  Medicines to relieve symptoms. These can include over-the-counter medicine for pain and fever, medicines for cough or congestion, and medicines to relieve diarrhea.  Antiviral medicines. These drugs are  available only for certain types of viruses. They may help reduce flu symptoms if taken early. There are also many antiviral medicines for hepatitis and HIV/AIDS. Some viral illnesses can be prevented with vaccinations. A common example is the flu shot. Follow these instructions at home: Medicines   Take over-the-counter and prescription medicines only as told by your health care provider.  If you were prescribed an antiviral medicine, take it as told by your health care provider. Do not stop taking the medicine even if you start to feel better.  Be aware of when antibiotics are needed and when they are not needed. Antibiotics do not treat viruses. If your health care provider thinks that you may have a bacterial infection as well as a viral infection, you may get an antibiotic. ? Do not ask for an antibiotic prescription if you have been diagnosed with a viral illness. That will not make your illness go away faster. ? Frequently taking antibiotics when they are not needed can lead to antibiotic resistance. When this develops, the medicine no longer  works against the bacteria that it normally fights. General instructions  Drink enough fluids to keep your urine clear or pale yellow.  Rest as much as possible.  Return to your normal activities as told by your health care provider. Ask your health care provider what activities are safe for you.  Keep all follow-up visits as told by your health care provider. This is important. How is this prevented? Take these actions to reduce your risk of viral infection:  Eat a healthy diet and get enough rest.  Wash your hands often with soap and water. This is especially important when you are in public places. If soap and water are not available, use hand sanitizer.  Avoid close contact with friends and family who have a viral illness.  If you travel to areas where viral gastrointestinal infection is common, avoid drinking water or eating raw  food.  Keep your immunizations up to date. Get a flu shot every year as told by your health care provider.  Do not share toothbrushes, nail clippers, razors, or needles with other people.  Always practice safe sex.  Contact a health care provider if:  You have symptoms of a viral illness that do not go away.  Your symptoms come back after going away.  Your symptoms get worse. Get help right away if:  You have trouble breathing.  You have a severe headache or a stiff neck.  You have severe vomiting or abdominal pain. This information is not intended to replace advice given to you by your health care provider. Make sure you discuss any questions you have with your health care provider. Document Released: 12/02/2015 Document Revised: 01/04/2016 Document Reviewed: 12/02/2015 Elsevier Interactive Patient Education  2019 [employer].

## 2018-09-09 ENCOUNTER — Encounter: Payer: Self-pay | Admitting: Physician Assistant

## 2018-09-09 ENCOUNTER — Ambulatory Visit: Payer: Self-pay

## 2018-09-09 ENCOUNTER — Ambulatory Visit (INDEPENDENT_AMBULATORY_CARE_PROVIDER_SITE_OTHER): Payer: 59 | Admitting: Physician Assistant

## 2018-09-09 VITALS — BP 130/90 | HR 84 | Temp 97.8°F | Ht 67.0 in | Wt 286.2 lb

## 2018-09-09 DIAGNOSIS — R6889 Other general symptoms and signs: Secondary | ICD-10-CM

## 2018-09-09 DIAGNOSIS — R42 Dizziness and giddiness: Secondary | ICD-10-CM

## 2018-09-09 LAB — POC INFLUENZA A&B (BINAX/QUICKVUE)
Influenza A, POC: NEGATIVE
Influenza B, POC: NEGATIVE

## 2018-09-09 LAB — POCT URINE PREGNANCY: Preg Test, Ur: NEGATIVE

## 2018-09-09 NOTE — Telephone Encounter (Signed)
See note

## 2018-09-09 NOTE — Patient Instructions (Signed)
It was great to see you!  This all sounds very viral, but we are checking labs to make sure nothing else is causing your symptoms.  If you develop worsening symptoms, please return to the office.  If your symptoms do not go away, please return to the office.  Work on pushing fluids, eating regular meals and snacks with good sources of protein.

## 2018-09-09 NOTE — Telephone Encounter (Signed)
Pt called stating that she has just come home from a cruise. She says that yesterday prior to de boarding she started to feel dizzy.  She states that her arms feel weak also.  She has felt nauseated but has not vomited. She denies fever or other symptoms.  She did not wear a scopolamine patch. she has had plenty of fluid.  She said the weather was pleasant not hot. Appointment scheduled per protocol. Care advice read to patient. Pt verbalized understanding of all instructions. Reason for Disposition . [1] MILD dizziness (e.g., walking normally) AND [2] has NOT been evaluated by physician for this  (Exception: dizziness caused by heat exposure, sudden standing, or poor fluid intake)  Answer Assessment - Initial Assessment Questions 1. DESCRIPTION: "Describe your dizziness."     Lightheaded feels rocking 2. LIGHTHEADED: "Do you feel lightheaded?" (e.g., somewhat faint, woozy, weak upon standing)     no 3. VERTIGO: "Do you feel like either you or the room is spinning or tilting?" (i.e. vertigo)     no 4. SEVERITY: "How bad is it?"  "Do you feel like you are going to faint?" "Can you stand and walk?"   - MILD - walking normally   - MODERATE - interferes with normal activities (e.g., work, school)    - SEVERE - unable to stand, requires support to walk, feels like passing out now.      mild 5. ONSET:  "When did the dizziness begin?"     On the ship yesterday 6. AGGRAVATING FACTORS: "Does anything make it worse?" (e.g., standing, change in head position)     no 7. HEART RATE: "Can you tell me your heart rate?" "How many beats in 15 seconds?"  (Note: not all patients can do this)       No 8. CAUSE: "What do you think is causing the dizziness?"     unsure 9. RECURRENT SYMPTOM: "Have you had dizziness before?" If so, ask: "When was the last time?" "What happened that time?"     no 10. OTHER SYMPTOMS: "Do you have any other symptoms?" (e.g., fever, chest pain, vomiting, diarrhea, bleeding)  naseated off and on upper arms feel weak 11. PREGNANCY: "Is there any chance you are pregnant?" "When was your last menstrual period?"       No  Menses last month  Protocols used: DIZZINESS [facility]

## 2018-09-09 NOTE — Progress Notes (Signed)
[name] [name] is a 23 y.o. female here for a new problem.  I acted as a Education administrator for [employer], PA-C Anselmo Pickler, LPN  Chief Complaint  Patient presents with  . Dizziness   HPI Patient is here with several health concerns:  She states that she has had neck stiffness x 2 months. This has been addressed by her PCP. It is unchanged. She doesn't have fever or any new symptoms.  Her main issue is she just arrived yesterday morning from a 3-day cruise. She has been on cruises in the past. She denied excessive alcohol or food intake while on ship. States that she had some motion sickness throughout the trip but no nausea, nor was it debilitating enough to enjoy the cruise. Right before she left the cruise, her son was diagnosed with the flu. She has some issues with arms "feeling weak". Denies diarrhea, but is having more "frequent" stools. Denies abd pain.  Denies: confusion, chills, congestion, fever, sore throat, vomiting  She has not tried anything for her symptoms.  Last period was "sometime in January."  She states that she can normally figure out what her symptoms are on google but this time she couldn't.  Past Medical History:  Diagnosis Date  . Anemia   . Fibroadenoma of left breast    needs follow up 1/20  . Fibroid   . Headache   . History of chicken pox   . Medical history non-contributory   . Streptococcus B carrier or suspected carrier      Social History   Socioeconomic History  . Marital status: Single    Spouse name: Not on file  . Number of children: 1  . Years of education: 2 yrs coll  . Highest education level: Not on file  Occupational History  . Occupation: Secondary school teacher  Social Needs  . Financial resource strain: Not hard at all  . Food insecurity:    Worry: Never true    Inability: Never true  . Transportation needs:    Medical: No    Non-medical: No  Tobacco Use  . Smoking status: Former Smoker    Last attempt to quit: 10/25/2010     Years since quitting: 7.8  . Smokeless tobacco: Never Used  Substance and Sexual Activity  . Alcohol use: No    Comment: socially  . Drug use: No  . Sexual activity: Yes    Birth control/protection: None  Lifestyle  . Physical activity:    Days per week: 0 days    Minutes per session: 0 min  . Stress: Not at all  Relationships  . Social connections:    Talks on phone: Once a week    Gets together: Once a week    Attends religious service: Never    Active member of club or organization: No    Attends meetings of clubs or organizations: Never    Relationship status: Living with partner  . Intimate partner violence:    Fear of current or ex partner: No    Emotionally abused: No    Physically abused: No    Forced sexual activity: No  Other Topics Concern  . Not on file  Social History Narrative   Lives at home with boyfriend and son.   Right-handed.   2-4 cups caffeine daily (unsweet tea).    Past Surgical History:  Procedure Laterality Date  . NO PAST SURGERIES      Family History  Problem Relation Age of Onset  .  Healthy Mother   . Hypertension Father   . Hyperlipidemia Father   . Mental retardation Brother   . COPD Maternal Grandmother     No Known Allergies  Current Medications:  No current outpatient medications on file.   Review of Systems:   Review of Systems  Constitutional: Negative for chills, fever, malaise/fatigue and weight loss.  Eyes: Negative for blurred vision, double vision and photophobia.  Respiratory: Negative for cough and shortness of breath.   Cardiovascular: Negative for chest pain and leg swelling.  Gastrointestinal: Positive for nausea.  Genitourinary: Negative for dysuria and frequency.  Musculoskeletal: Positive for neck pain.  Neurological: Negative for dizziness and headaches.    Vitals:   Vitals:   09/09/18 1610  BP: 130/90  Pulse: 84  Temp: 97.8 F (36.6 C)  TempSrc: Oral  SpO2: 99%  Weight: 286 lb 4 oz (129.8  kg)  Height: 5\' 7"  (1.702 m)     Body mass index is 44.83 kg/m.  Physical Exam:   Physical Exam Vitals signs and nursing note reviewed.  Constitutional:      General: She is not in acute distress.    Appearance: She is well-developed. She is not ill-appearing or toxic-appearing.  HENT:     Head: Normocephalic and atraumatic.     Right Ear: Tympanic membrane, ear canal and external ear normal. Tympanic membrane is not erythematous, retracted or bulging.     Left Ear: Tympanic membrane, ear canal and external ear normal. Tympanic membrane is not erythematous, retracted or bulging.     Nose: Nose normal.     Right Sinus: No maxillary sinus tenderness or frontal sinus tenderness.     Left Sinus: No maxillary sinus tenderness or frontal sinus tenderness.     Mouth/Throat:     Pharynx: Uvula midline. No posterior oropharyngeal erythema.  Eyes:     General: Lids are normal.     Conjunctiva/sclera: Conjunctivae normal.  Neck:     Musculoskeletal: Full passive range of motion without pain, normal range of motion and neck supple.     Trachea: Trachea normal.  Cardiovascular:     Rate and Rhythm: Normal rate and regular rhythm.     Pulses: Normal pulses.     Heart sounds: Normal heart sounds, S1 normal and S2 normal.     Comments: No LE edema Pulmonary:     Effort: Pulmonary effort is normal.     Breath sounds: Normal breath sounds. No decreased breath sounds, wheezing, rhonchi or rales.  Abdominal:     General: Abdomen is flat. Bowel sounds are normal.     Palpations: Abdomen is soft.     Tenderness: There is no abdominal tenderness. There is no right CVA tenderness or left CVA tenderness.  Musculoskeletal:     Comments: No bony tenderness to cervical spine. Free ROM without evident discomfort with movement of neck and cervical spine.  Lymphadenopathy:     Cervical: No cervical adenopathy.  Skin:    General: Skin is warm and dry.  Neurological:     Mental Status: She is alert.      GCS: GCS eye subscore is 4. GCS verbal subscore is 5. GCS motor subscore is 6.     Cranial Nerves: Cranial nerves are intact.     Sensory: Sensation is intact.     Motor: Motor function is intact.     Coordination: Coordination is intact.  Psychiatric:        Speech: Speech normal.  Behavior: Behavior normal. Behavior is cooperative.     Results for orders placed or performed in visit on 09/09/18  POC Influenza A&B(BINAX/QUICKVUE)  Result Value Ref Range   Influenza A, POC Negative Negative   Influenza B, POC Negative Negative  POCT urine pregnancy  Result Value Ref Range   Preg Test, Ur Negative Negative    Assessment and Plan:   [name] [name] was seen today for dizziness.  Diagnoses and all orders for this visit:  Flu-like symptoms and Lightheadedness No red flags on exam. Urine pregnancy test negative. Flu test negative. She has several vague complaints -- I am unsure of etiology of symptoms at this time. Possible viral illness from recent travel. Discussed pushing fluids, rest and monitoring symptoms. Will check CBC and CMP to r/o organic cause. Follow-up with PCP if symptoms persist or worsen. -     POC Influenza A&B(BINAX/QUICKVUE) -     POCT urine pregnancy -     Comprehensive metabolic panel; Future -     CBC with Differential/Platelet; Future  . Reviewed expectations re: course of current medical issues. . Discussed self-management of symptoms. . Outlined signs and symptoms indicating need for more acute intervention. . Patient verbalized understanding and all questions were answered. . See orders for this visit as documented in the electronic medical record. . Patient received an After-Visit Summary.  CMA or LPN served as scribe during this visit. History, Physical, and Plan performed by medical provider. The above documentation has been reviewed and is accurate and complete.   Inda Coke, PA-C

## 2018-09-10 ENCOUNTER — Other Ambulatory Visit (INDEPENDENT_AMBULATORY_CARE_PROVIDER_SITE_OTHER): Payer: 59

## 2018-09-10 ENCOUNTER — Encounter: Payer: Self-pay | Admitting: Physician Assistant

## 2018-09-10 DIAGNOSIS — R42 Dizziness and giddiness: Secondary | ICD-10-CM

## 2018-09-10 LAB — CBC WITH DIFFERENTIAL/PLATELET
Basophils Absolute: 0 10*3/uL (ref 0.0–0.1)
Basophils Relative: 0.6 % (ref 0.0–3.0)
EOS ABS: 0.1 10*3/uL (ref 0.0–0.7)
Eosinophils Relative: 1.1 % (ref 0.0–5.0)
HEMATOCRIT: 35.9 % — AB (ref 36.0–46.0)
HEMOGLOBIN: 11.9 g/dL — AB (ref 12.0–15.0)
Lymphocytes Relative: 29.6 % (ref 12.0–46.0)
Lymphs Abs: 1.8 10*3/uL (ref 0.7–4.0)
MCHC: 33 g/dL (ref 30.0–36.0)
MCV: 83.3 fl (ref 78.0–100.0)
MONO ABS: 0.3 10*3/uL (ref 0.1–1.0)
MONOS PCT: 4.4 % (ref 3.0–12.0)
Neutro Abs: 4 10*3/uL (ref 1.4–7.7)
Neutrophils Relative %: 64.3 % (ref 43.0–77.0)
Platelets: 278 10*3/uL (ref 150.0–400.0)
RBC: 4.31 Mil/uL (ref 3.87–5.11)
RDW: 16 % — AB (ref 11.5–15.5)
WBC: 6.2 10*3/uL (ref 4.0–10.5)

## 2018-09-11 ENCOUNTER — Other Ambulatory Visit: Payer: Self-pay | Admitting: Radiology

## 2018-09-11 ENCOUNTER — Other Ambulatory Visit (INDEPENDENT_AMBULATORY_CARE_PROVIDER_SITE_OTHER): Payer: 59

## 2018-09-11 DIAGNOSIS — R42 Dizziness and giddiness: Secondary | ICD-10-CM | POA: Diagnosis not present

## 2018-09-11 LAB — COMPREHENSIVE METABOLIC PANEL
ALT: 31 U/L (ref 0–35)
AST: 23 U/L (ref 0–37)
Albumin: 4.3 g/dL (ref 3.5–5.2)
Alkaline Phosphatase: 55 U/L (ref 39–117)
BILIRUBIN TOTAL: 0.5 mg/dL (ref 0.2–1.2)
BUN: 12 mg/dL (ref 6–23)
CALCIUM: 9.1 mg/dL (ref 8.4–10.5)
CO2: 29 meq/L (ref 19–32)
CREATININE: 0.73 mg/dL (ref 0.40–1.20)
Chloride: 103 mEq/L (ref 96–112)
GFR: 97.12 mL/min (ref >60.00)
GLUCOSE: 87 mg/dL (ref 70–99)
Potassium: 4.1 mEq/L (ref 3.5–5.1)
SODIUM: 136 meq/L (ref 135–145)
Total Protein: 6.9 g/dL (ref 6.0–8.3)

## 2018-11-11 ENCOUNTER — Ambulatory Visit (INDEPENDENT_AMBULATORY_CARE_PROVIDER_SITE_OTHER): Payer: 59 | Admitting: Physician Assistant

## 2018-11-11 ENCOUNTER — Emergency Department (HOSPITAL_COMMUNITY)
Admission: EM | Admit: 2018-11-11 | Discharge: 2018-11-12 | Disposition: A | Discharge status: Home / Self Care | Payer: 59 | Source: Home / Self Care | Attending: Emergency Medicine | Admitting: Emergency Medicine

## 2018-11-11 ENCOUNTER — Encounter: Payer: Self-pay | Admitting: Physician Assistant

## 2018-11-11 ENCOUNTER — Other Ambulatory Visit: Payer: Self-pay

## 2018-11-11 DIAGNOSIS — Z87891 Personal history of nicotine dependence: Secondary | ICD-10-CM

## 2018-11-11 DIAGNOSIS — H748X3 Other specified disorders of middle ear and mastoid, bilateral: Secondary | ICD-10-CM | POA: Diagnosis not present

## 2018-11-11 DIAGNOSIS — H9201 Otalgia, right ear: Secondary | ICD-10-CM

## 2018-11-11 DIAGNOSIS — H7091 Unspecified mastoiditis, right ear: Secondary | ICD-10-CM | POA: Diagnosis not present

## 2018-11-11 DIAGNOSIS — H60391 Other infective otitis externa, right ear: Secondary | ICD-10-CM | POA: Insufficient documentation

## 2018-11-11 DIAGNOSIS — Z79899 Other long term (current) drug therapy: Secondary | ICD-10-CM | POA: Insufficient documentation

## 2018-11-11 DIAGNOSIS — A419 Sepsis, unspecified organism: Secondary | ICD-10-CM | POA: Diagnosis not present

## 2018-11-11 DIAGNOSIS — R110 Nausea: Secondary | ICD-10-CM | POA: Insufficient documentation

## 2018-11-11 MED ORDER — AMOXICILLIN-POT CLAVULANATE 875-125 MG PO TABS: 1.0000 | ORAL_TABLET | Freq: Two times a day (BID) | ORAL | 0 refills | Status: DC

## 2018-11-11 NOTE — Progress Notes (Signed)
Virtual Visit via Video   I connected with [name] [name] on 11/11/18 at  9:00 AM EDT by a video enabled telemedicine application and verified that I am speaking with the correct person using two identifiers. Location patient: Home Location provider: [employer], Office Persons participating in the virtual visit: [name] [name], [name] [name], [place], [name] [name], [place]   I discussed the limitations of evaluation and management by telemedicine and the availability of in person appointments. The patient expressed understanding and agreed to proceed.  Subjective:   HPI: Otalgia Pt c/o R ear pain x 2 days. Worsening with time.   Denies: fever, chills, sore throat, nasal congestion, discharge from ear, difficulty swallowing, recent swimming, neck stiffness  Endorses: pain with movement of R ear, swollen lymph node behind ear, pain radiating to jaw  Has tried tylenol (cannot take too much NSAIDs due to gastritis) and cold/warm compresses with slight relief.    ROS: See pertinent positives and negatives per HPI.  Patient Active Problem List   Diagnosis Date Noted  . Postpartum anemia 04/25/2018  . Chronic migraine 12/13/2015  . Low back pain 12/13/2015  . Obesity (BMI 35.0-39.9 without comorbidity) 11/01/2015    Social History   Tobacco Use  . Smoking status: Former Smoker    Last attempt to quit: 10/25/2010    Years since quitting: 8.0  . Smokeless tobacco: Never Used  Substance Use Topics  . Alcohol use: No    Comment: socially    Current Outpatient Medications:  .  amoxicillin-clavulanate (AUGMENTIN) 875-125 MG tablet, Take 1 tablet by mouth 2 (two) times daily., Disp: 20 tablet, Rfl: 0  No Known Allergies  Objective:   VITALS: Per patient if applicable, see vitals. GENERAL: Alert, appears well and in no acute distress. HEENT: Atraumatic, conjunctiva clear, no obvious abnormalities on inspection of external nose and ears. NECK: Normal movements of the head  and neck. CARDIOPULMONARY: No increased WOB. Speaking in clear sentences. I:E ratio WNL.  MS: Moves all visible extremities without noticeable abnormality. PSYCH: Pleasant and cooperative, well-groomed. Speech normal rate and rhythm. Affect is appropriate. Insight and judgement are appropriate. Attention is focused, linear, and appropriate.  NEURO: CN grossly intact. Oriented as arrived to appointment on time with no prompting. Moves both UE equally.  SKIN: No obvious lesions, wounds, erythema, or cyanosis noted on face or hands.  Assessment and Plan:   [name] was seen today for otalgia.  Diagnoses and all orders for this visit:  Right ear pain  Other orders -     amoxicillin-clavulanate (AUGMENTIN) 875-125 MG tablet; Take 1 tablet by mouth 2 (two) times daily.   Concern for AOM. Start augmentin. Follow-up if symptoms persist. Worsening precautions reviewed at today's visit.  . Reviewed expectations re: course of current medical issues. . Discussed self-management of symptoms. . Outlined signs and symptoms indicating need for more acute intervention. . Patient verbalized understanding and all questions were answered. Marland Kitchen Health Maintenance issues including appropriate healthy diet, exercise, and smoking avoidance were discussed with patient. . See orders for this visit as documented in the electronic medical record.  I discussed the assessment and treatment plan with the patient. The patient was provided an opportunity to ask questions and all were answered. The patient agreed with the plan and demonstrated an understanding of the instructions.   The patient was advised to call back or seek an in-person evaluation if the symptoms worsen or if the condition fails to improve as anticipated.  CMA or  LPN served as Education administrator during this visit. History, Physical, and Plan performed by medical provider. The above documentation has been reviewed and is accurate and complete.   [place],  [place] 11/11/2018

## 2018-11-12 ENCOUNTER — Encounter (HOSPITAL_COMMUNITY): Payer: Self-pay | Admitting: Emergency Medicine

## 2018-11-12 ENCOUNTER — Other Ambulatory Visit: Payer: Self-pay

## 2018-11-12 DIAGNOSIS — H60551 Acute reactive otitis externa, right ear: Secondary | ICD-10-CM | POA: Diagnosis not present

## 2018-11-12 DIAGNOSIS — R509 Fever, unspecified: Secondary | ICD-10-CM | POA: Diagnosis not present

## 2018-11-12 MED ORDER — CIPROFLOXACIN-DEXAMETHASONE 0.3-0.1 % OT SUSP: 4.0000 [drp] | Freq: Two times a day (BID) | OTIC | 0 refills | Status: DC

## 2018-11-12 MED ORDER — ONDANSETRON 4 MG PO TBDP
4.0000 mg | ORAL_TABLET | Freq: Once | ORAL | Status: AC
  Administered 2018-11-12: 01:00:00 4 mg via ORAL
  Filled 2018-11-12: qty 1

## 2018-11-12 MED ORDER — OXYCODONE-ACETAMINOPHEN 5-325 MG PO TABS: 1.0000 | ORAL_TABLET | Freq: Three times a day (TID) | ORAL | 0 refills | Status: DC | PRN

## 2018-11-12 MED ORDER — CIPROFLOXACIN-DEXAMETHASONE 0.3-0.1 % OT SUSP
4.0000 [drp] | Freq: Once | OTIC | Status: AC
  Administered 2018-11-12: 4 [drp] via OTIC
  Filled 2018-11-12: qty 7.5

## 2018-11-12 MED ORDER — OXYCODONE-ACETAMINOPHEN 5-325 MG PO TABS
1.0000 | ORAL_TABLET | Freq: Once | ORAL | Status: AC
  Administered 2018-11-12: 01:00:00 1 via ORAL
  Filled 2018-11-12: qty 1

## 2018-11-12 NOTE — Discharge Instructions (Signed)
Thank you for allowing me to care for you today in the Emergency Department.   Place 4 drops in your right ear 2 times daily for the first 7 days.  Your first dose was tonight.  Try to lay on your left side for 10 to 20 minutes after placing the drops.  Additional information on using eardrops are attached.  Take this medication for the next 7 days.  Continue to take the entire course of Augmentin that you were prescribed by your primary care provider.  The ear wick that was placed in your ear should fall out on its own within the next week.  If it does not you can follow-up with primary care to have it removed.  For pain control, you can take 650 mg of Tylenol or 600 mg of ibuprofen with food once every 6 hours for pain control.  You can alternate between these 2 medications every 3 hours. For severe, uncontrollable pain, you can take one tablet of Percocet once every 8 hours. Each Percocet has 325 mg of Tylenol. Do not take more than 4000 mg of Tylenol from all sources in a 24-hour period. Perocet is a narcotic. It can be addicting. Do not work or drive while taking this medication. Do not take it with other medications or substances that may make you drowsy.  You should also follow-up with primary care if you continue to have hearing loss in the right ear, pain in the right side of your face, difficulty chewing after you have completed the course of antibiotics.  Regarding cleaning area, do not place Q-tips in the ear.  They can put you at risk for rupturing your eardrum.  Return to the emergency department if you develop stiffness of your neck, high fevers despite taking Tylenol and ibuprofen, if you become unable to open your mouth, if you start to have bloody discharge from the ear, or if you develop other new, worsening conditions, particularly after being on antibiotics for 48 to 72 hours.

## 2018-11-12 NOTE — ED Provider Notes (Signed)
[facility] EMERGENCY DEPARTMENT Provider Note   CSN: [id number] Arrival date & time: 11/11/18  2331    History   Chief Complaint Chief Complaint  Patient presents with  . Otalgia    Otitis Media    HPI CHALISE PE is a 23 y.o. female with a history of obesity who presents to the emergency department for a chief complaint of right ear pain.  The patient endorses constant, throbbing right ear pain that began yesterday.  She reports a history of previous ear infections and had a telemedicine visit with her PCP yesterday and was started on p.o. Augmentin.  She reports that she has taken 2 tablets of the medication today.  She reports that despite taking Augmentin her pain significantly worsened.  She reports the pain starts at her right ear and radiates down the right side of her face to her chin.  She is feeling nauseated from the pain, but has had no vomiting.  She reports that she was awake most of the night last night due to the pain.  She also developed a "knot" behind her right ear that is tender. She reports that she has been unable to hear out of the right ear all day.  She reports that she has been intermittently feeling dizzy earlier today, but denies lightheadedness, headache, or falls.  She also reports that she developed yellow otorrhea tonight.  She has been treating her symptoms with Tylenol and ibuprofen with minimal improvement in her pain.  She denies fever, chills, sore throat, neck pain or stiffness, facial or neck swelling, trismus, or bloody otorrhea.  Reports that last week she was having some pain in her left ear, but this is since resolved.  She reports that she uses Q-tips in both of her ears several times per week.      The history is provided by the patient. No language interpreter was used.    Past Medical History:  Diagnosis Date  . Anemia   . Fibroadenoma of left breast    needs follow up 1/20  . Fibroid   . Headache   . History of  chicken pox   . Medical history non-contributory   . Streptococcus B carrier or suspected carrier     Patient Active Problem List   Diagnosis Date Noted  . Postpartum anemia 04/25/2018  . Chronic migraine 12/13/2015  . Low back pain 12/13/2015  . Obesity (BMI 35.0-39.9 without comorbidity) 11/01/2015    Past Surgical History:  Procedure Laterality Date  . NO PAST SURGERIES       OB History    Gravida  2   Para  2   Term  2   Preterm  0   AB  0   Living  2     SAB  0   TAB  0   Ectopic  0   Multiple  0   Live Births  2            Home Medications    Prior to Admission medications   Medication Sig Start Date End Date Taking? Authorizing Provider  acetaminophen (TYLENOL) 325 MG tablet Take 650 mg by mouth every 6 (six) hours as needed for mild pain or fever.   Yes [provider]  amoxicillin-clavulanate (AUGMENTIN) 875-125 MG tablet Take 1 tablet by mouth 2 (two) times daily. 11/11/18  Yes Inda Coke, PA  ibuprofen (ADVIL,MOTRIN) 200 MG tablet Take 200 mg by mouth every 6 (six) hours as  needed for fever or mild pain.   Yes [provider]  ciprofloxacin-dexamethasone (CIPRODEX) OTIC suspension Place 4 drops into the right ear 2 (two) times daily for 7 days. 11/12/18 11/19/18  Davion Flannery A, PA-C  oxyCODONE-acetaminophen (PERCOCET/ROXICET) 5-325 MG tablet Take 1 tablet by mouth every 8 (eight) hours as needed for severe pain. 11/12/18   Sharryn Belding A, PA-C    Family History Family History  Problem Relation Age of Onset  . Healthy Mother   . Hypertension Father   . Hyperlipidemia Father   . Mental retardation Brother   . COPD Maternal Grandmother     Social History Social History   Tobacco Use  . Smoking status: Former Smoker    Last attempt to quit: 10/25/2010    Years since quitting: 8.0  . Smokeless tobacco: Never Used  Substance Use Topics  . Alcohol use: No    Comment: socially  . Drug use: No     Allergies    Patient has no known allergies.   Review of Systems Review of Systems  Constitutional: Negative for activity change, chills and fever.  HENT: Positive for ear discharge, ear pain and hearing loss. Negative for congestion, dental problem, facial swelling, postnasal drip, rhinorrhea, sinus pressure, sinus pain, sore throat and tinnitus.   Respiratory: Negative for shortness of breath.   Cardiovascular: Negative for chest pain.  Gastrointestinal: Positive for nausea. Negative for abdominal pain, diarrhea and vomiting.  Genitourinary: Negative for dysuria.  Musculoskeletal: Negative for back pain.  Skin: Negative for rash.  Allergic/Immunologic: Negative for immunocompromised state.  Neurological: Negative for headaches.  Hematological: Positive for adenopathy.  Psychiatric/Behavioral: Negative for confusion.    Physical Exam Updated Vital Signs BP 106/74 (BP Location: Right Arm)   Pulse 98   Temp 99.2 F (37.3 C) (Oral)   Resp 16   LMP 10/21/2018 (Approximate)   SpO2 99%   Physical Exam Vitals signs and nursing note reviewed.  Constitutional:      General: She is not in acute distress. HENT:     Head: Normocephalic. No right periorbital erythema or left periorbital erythema.     Jaw: There is normal jaw occlusion.     Comments: Right canal is significantly edematous.  Unable to visualize the right TM secondary to edema in the canal.  Hearing is decreased on the right.  Pain with movement of the auricle and tragus.  Posterior auricular lymphadenopathy on the right.  Left TM is retracted, but intact.  No edema of the left canal.  Yellow otorrhea is noted at the opening of the canal on the right ear.  No bloody otorrhea.     Right Ear: Decreased hearing noted. Drainage, swelling and tenderness present. There is no impacted cerumen.     Left Ear: Hearing normal. There is no impacted cerumen. Tympanic membrane is not perforated.     Nose: Nose normal.     Mouth/Throat:     Pharynx:  Oropharynx is clear. Uvula midline. No pharyngeal swelling, oropharyngeal exudate, posterior oropharyngeal erythema or uvula swelling.     Tonsils: No tonsillar exudate or tonsillar abscesses.  Eyes:     Conjunctiva/sclera: Conjunctivae normal.  Neck:     Musculoskeletal: Neck supple.  Cardiovascular:     Rate and Rhythm: Normal rate and regular rhythm.     Heart sounds: No murmur. No friction rub. No gallop.   Pulmonary:     Effort: Pulmonary effort is normal. No respiratory distress.  Abdominal:  General: There is no distension.     Palpations: Abdomen is soft.  Skin:    General: Skin is warm.     Findings: No rash.  Neurological:     Mental Status: She is alert.  Psychiatric:        Behavior: Behavior normal.      ED Treatments / Results  Labs (all labs ordered are listed, but only abnormal results are displayed) Labs Reviewed - No data to display  EKG None  Radiology No results found.  Procedures Procedures (including critical care time)  Medications Ordered in ED Medications  oxyCODONE-acetaminophen (PERCOCET/ROXICET) 5-325 MG per tablet 1 tablet (1 tablet Oral Given 11/12/18 0052)  ciprofloxacin-dexamethasone (CIPRODEX) 0.3-0.1 % OTIC (EAR) suspension 4 drop (4 drops Right EAR Given 11/12/18 0052)  ondansetron (ZOFRAN-ODT) disintegrating tablet 4 mg (4 mg Oral Given 11/12/18 0052)     Initial Impression / Assessment and Plan / ED Course  I have reviewed the triage vital signs and the nursing notes.  Pertinent labs & imaging results that were available during my care of the patient were reviewed by me and considered in my medical decision making (see chart for details).  Clinical Course as of Nov 12 503  Wed Nov 12, 2018  0200 After discussing discharge instructions with the patient that the Ciprodex drops that she was given in the ER would be for home use, I was notified by nursing staff that he had thrown the bottle of Ciprodex drops in the trash and was  requesting a prescription for the patient to have filled at her pharmacy.  Prescription was provided.   [MM]    Clinical Course User Index [MM] Raife Lizer A, PA-C       23 year old female with history of obesity presenting with right otalgia and otorrhea.  She is afebrile, but hypertensive and tachycardic on arrival, which I suspect is secondary to pain.  She appeared exquisitely uncomfortable with minimal movement of her right auricle.  Since she has been treating her symptoms wit over-the-counter pain medication, will try to better control her pain with Percocet.  On exam, yellow otorrhea is noted at the external meatus of the right ear canal.  The canal is very edematous and the TM is not able to be visualized on the right.  She was started on Augmentin, but needs an ear wick and otic antibiotic drops for acute otitis externa.  Ear wick was placed by me and the patient was started on Ciprodex with her first dose given in the ER.  I have advised the patient to continue oral Augmentin and to follow-up with her primary care provider if her ear wick does not fall out spontaneously.  Following Percocet, her blood pressure normalized and tachycardia resolved without any other treatment.  I will discharge her home with a short course of Percocet. A 53-month prescription history query was performed using the [place] CSRS prior to discharge.  Strict return precautions given.  She is hemodynamically stable and in no acute distress.  Safe for discharge home with outpatient follow-up at this time.    Final Clinical Impressions(s) / ED Diagnoses   Final diagnoses:  Infective otitis externa of right ear    ED Discharge Orders         Ordered    oxyCODONE-acetaminophen (PERCOCET/ROXICET) 5-325 MG tablet  Every 8 hours PRN     11/12/18 0127    ciprofloxacin-dexamethasone (CIPRODEX) OTIC suspension  2 times daily     11/12/18 0151  Joline Maxcy A, PA-C 11/12/18 0505    Ezequiel Essex,  MD 11/12/18 [phone]

## 2018-11-12 NOTE — ED Triage Notes (Signed)
Patient reports right ear ache with drainage onset yesterday , denies injury , no fever or chills , no hearing loss.

## 2018-11-13 ENCOUNTER — Ambulatory Visit: Payer: Self-pay

## 2018-11-13 ENCOUNTER — Ambulatory Visit (INDEPENDENT_AMBULATORY_CARE_PROVIDER_SITE_OTHER): Payer: 59 | Admitting: Family Medicine

## 2018-11-13 ENCOUNTER — Encounter (HOSPITAL_COMMUNITY): Payer: Self-pay | Admitting: Emergency Medicine

## 2018-11-13 ENCOUNTER — Encounter: Payer: Self-pay | Admitting: Family Medicine

## 2018-11-13 ENCOUNTER — Other Ambulatory Visit: Payer: Self-pay

## 2018-11-13 ENCOUNTER — Telehealth: Payer: Self-pay | Admitting: Physician Assistant

## 2018-11-13 ENCOUNTER — Emergency Department (HOSPITAL_COMMUNITY)
Admission: EM | Admit: 2018-11-13 | Discharge: 2018-11-13 | Disposition: A | Discharge status: Home / Self Care | Payer: 59 | Source: Home / Self Care | Attending: Emergency Medicine | Admitting: Emergency Medicine

## 2018-11-13 ENCOUNTER — Emergency Department (HOSPITAL_COMMUNITY): Payer: 59

## 2018-11-13 ENCOUNTER — Inpatient Hospital Stay (HOSPITAL_COMMUNITY)
Admission: EM | Admit: 2018-11-13 | Discharge: 2018-11-15 | DRG: 872 | Disposition: A | Discharge status: Home / Self Care | Payer: 59 | Attending: Internal Medicine | Admitting: Internal Medicine

## 2018-11-13 VITALS — Temp 100.0°F | Ht 67.0 in | Wt 286.0 lb

## 2018-11-13 DIAGNOSIS — G43809 Other migraine, not intractable, without status migrainosus: Secondary | ICD-10-CM | POA: Diagnosis present

## 2018-11-13 DIAGNOSIS — H60391 Other infective otitis externa, right ear: Secondary | ICD-10-CM | POA: Diagnosis not present

## 2018-11-13 DIAGNOSIS — Z87891 Personal history of nicotine dependence: Secondary | ICD-10-CM | POA: Insufficient documentation

## 2018-11-13 DIAGNOSIS — H60311 Diffuse otitis externa, right ear: Secondary | ICD-10-CM | POA: Diagnosis not present

## 2018-11-13 DIAGNOSIS — E669 Obesity, unspecified: Secondary | ICD-10-CM | POA: Diagnosis present

## 2018-11-13 DIAGNOSIS — Z79899 Other long term (current) drug therapy: Secondary | ICD-10-CM | POA: Insufficient documentation

## 2018-11-13 DIAGNOSIS — Z8249 Family history of ischemic heart disease and other diseases of the circulatory system: Secondary | ICD-10-CM

## 2018-11-13 DIAGNOSIS — H7091 Unspecified mastoiditis, right ear: Secondary | ICD-10-CM | POA: Diagnosis not present

## 2018-11-13 DIAGNOSIS — H70001 Acute mastoiditis without complications, right ear: Secondary | ICD-10-CM | POA: Diagnosis present

## 2018-11-13 DIAGNOSIS — H7090 Unspecified mastoiditis, unspecified ear: Secondary | ICD-10-CM | POA: Diagnosis present

## 2018-11-13 DIAGNOSIS — Z8349 Family history of other endocrine, nutritional and metabolic diseases: Secondary | ICD-10-CM

## 2018-11-13 DIAGNOSIS — Z810 Family history of intellectual disabilities: Secondary | ICD-10-CM

## 2018-11-13 DIAGNOSIS — H60393 Other infective otitis externa, bilateral: Secondary | ICD-10-CM | POA: Insufficient documentation

## 2018-11-13 DIAGNOSIS — A419 Sepsis, unspecified organism: Principal | ICD-10-CM | POA: Diagnosis present

## 2018-11-13 DIAGNOSIS — H6090 Unspecified otitis externa, unspecified ear: Secondary | ICD-10-CM

## 2018-11-13 DIAGNOSIS — H748X3 Other specified disorders of middle ear and mastoid, bilateral: Secondary | ICD-10-CM | POA: Diagnosis not present

## 2018-11-13 DIAGNOSIS — Z6841 Body Mass Index (BMI) 40.0 and over, adult: Secondary | ICD-10-CM

## 2018-11-13 DIAGNOSIS — Z825 Family history of asthma and other chronic lower respiratory diseases: Secondary | ICD-10-CM

## 2018-11-13 LAB — BASIC METABOLIC PANEL
Anion gap: 11 (ref 5–15)
BUN: 9 mg/dL (ref 6–20)
CO2: 23 mmol/L (ref 22–32)
Calcium: 9.6 mg/dL (ref 8.9–10.3)
Chloride: 105 mmol/L (ref 98–111)
Creatinine, Ser: 0.84 mg/dL (ref 0.44–1.00)
GFR calc Af Amer: >60 mL/min (ref >60)
GFR calc non Af Amer: >60 mL/min (ref >60)
Glucose, Bld: 102 mg/dL — ABNORMAL HIGH (ref 70–99)
Potassium: 3.9 mmol/L (ref 3.5–5.1)
Sodium: 139 mmol/L (ref 135–145)

## 2018-11-13 LAB — CBC WITH DIFFERENTIAL/PLATELET
Abs Immature Granulocytes: 0.04 10*3/uL (ref 0.00–0.07)
Basophils Absolute: 0.1 10*3/uL (ref 0.0–0.1)
Basophils Relative: 1 %
Eosinophils Absolute: 0.1 10*3/uL (ref 0.0–0.5)
Eosinophils Relative: 1 %
HCT: 38.9 % (ref 36.0–46.0)
Hemoglobin: 12.3 g/dL (ref 12.0–15.0)
Immature Granulocytes: 0 %
Lymphocytes Relative: 16 %
Lymphs Abs: 1.8 10*3/uL (ref 0.7–4.0)
MCH: 28.1 pg (ref 26.0–34.0)
MCHC: 31.6 g/dL (ref 30.0–36.0)
MCV: 89 fL (ref 80.0–100.0)
Monocytes Absolute: 0.8 10*3/uL (ref 0.1–1.0)
Monocytes Relative: 7 %
Neutro Abs: 8.2 10*3/uL — ABNORMAL HIGH (ref 1.7–7.7)
Neutrophils Relative %: 75 %
Platelets: 313 10*3/uL (ref 150–400)
RBC: 4.37 MIL/uL (ref 3.87–5.11)
RDW: 13.7 % (ref 11.5–15.5)
WBC: 10.9 10*3/uL — ABNORMAL HIGH (ref 4.0–10.5)
nRBC: 0 % (ref 0.0–0.2)

## 2018-11-13 MED ORDER — KETOROLAC TROMETHAMINE 60 MG/2ML IM SOLN
30.0000 mg | Freq: Once | INTRAMUSCULAR | Status: AC
  Administered 2018-11-13: 30 mg via INTRAMUSCULAR

## 2018-11-13 MED ORDER — IOHEXOL 300 MG/ML  SOLN
75.0000 mL | Freq: Once | INTRAMUSCULAR | Status: AC | PRN
  Administered 2018-11-13: 75 mL via INTRAVENOUS

## 2018-11-13 MED ORDER — CIPROFLOXACIN-DEXAMETHASONE 0.3-0.1 % OT SUSP
4.0000 [drp] | Freq: Two times a day (BID) | OTIC | Status: DC
  Administered 2018-11-13: 23:00:00 4 [drp] via OTIC
  Filled 2018-11-13: qty 7.5

## 2018-11-13 MED ORDER — SODIUM CHLORIDE 0.9 % IV BOLUS
1000.0000 mL | Freq: Once | INTRAVENOUS | Status: AC
  Administered 2018-11-13: 1000 mL via INTRAVENOUS

## 2018-11-13 MED ORDER — CIPROFLOXACIN HCL 500 MG PO TABS: 500.0000 mg | ORAL_TABLET | Freq: Two times a day (BID) | ORAL | 0 refills | Status: DC

## 2018-11-13 MED ORDER — HYDROMORPHONE HCL 1 MG/ML IJ SOLN
1.0000 mg | Freq: Once | INTRAMUSCULAR | Status: AC
  Administered 2018-11-13: 18:00:00 1 mg via INTRAVENOUS
  Filled 2018-11-13: qty 1

## 2018-11-13 MED ORDER — SODIUM CHLORIDE 0.9 % IV SOLN
2.0000 g | Freq: Once | INTRAVENOUS | Status: AC
  Administered 2018-11-13: 19:00:00 2 g via INTRAVENOUS
  Filled 2018-11-13: qty 20

## 2018-11-13 MED ORDER — MORPHINE SULFATE (PF) 4 MG/ML IV SOLN
4.0000 mg | Freq: Once | INTRAVENOUS | Status: AC
  Administered 2018-11-13: 17:00:00 4 mg via INTRAVENOUS
  Filled 2018-11-13: qty 1

## 2018-11-13 MED ORDER — CIPROFLOXACIN IN D5W 200 MG/100ML IV SOLN
200.0000 mg | Freq: Two times a day (BID) | INTRAVENOUS | Status: DC
  Filled 2018-11-13: qty 100

## 2018-11-13 MED ORDER — CIPROFLOXACIN-DEXAMETHASONE 0.3-0.1 % OT SUSP: 4.0000 [drp] | Freq: Two times a day (BID) | OTIC | 0 refills | Status: DC

## 2018-11-13 MED ORDER — CIPROFLOXACIN-DEXAMETHASONE 0.3-0.1 % OT SUSP
4.0000 [drp] | Freq: Two times a day (BID) | OTIC | Status: DC
  Administered 2018-11-14 – 2018-11-15 (×3): 4 [drp] via OTIC
  Filled 2018-11-13: qty 7.5

## 2018-11-13 MED ORDER — CIPROFLOXACIN HCL 500 MG PO TABS
500.0000 mg | ORAL_TABLET | Freq: Once | ORAL | Status: AC
  Administered 2018-11-13: 500 mg via ORAL
  Filled 2018-11-13: qty 1

## 2018-11-13 MED ORDER — VANCOMYCIN HCL 10 G IV SOLR
2000.0000 mg | Freq: Once | INTRAVENOUS | Status: AC
  Administered 2018-11-13: 2000 mg via INTRAVENOUS
  Filled 2018-11-13: qty 2000

## 2018-11-13 MED ORDER — MORPHINE SULFATE (PF) 4 MG/ML IV SOLN
4.0000 mg | INTRAVENOUS | Status: DC | PRN
  Administered 2018-11-13: 4 mg via INTRAVENOUS
  Filled 2018-11-13: qty 1

## 2018-11-13 MED ORDER — HYDROCODONE-ACETAMINOPHEN 5-325 MG PO TABS
1.0000 | ORAL_TABLET | ORAL | Status: AC | PRN
  Administered 2018-11-13 – 2018-11-15 (×8): 1 via ORAL
  Filled 2018-11-13 (×8): qty 1

## 2018-11-13 MED ORDER — VANCOMYCIN HCL 10 G IV SOLR
1500.0000 mg | Freq: Two times a day (BID) | INTRAVENOUS | Status: DC
  Filled 2018-11-13 (×3): qty 1500

## 2018-11-13 MED ORDER — DIPHENHYDRAMINE HCL 25 MG PO CAPS: 25.0000 mg | ORAL_CAPSULE | Freq: Four times a day (QID) | ORAL | Status: DC | PRN

## 2018-11-13 MED ORDER — FENTANYL CITRATE (PF) 100 MCG/2ML IJ SOLN
75.0000 ug | Freq: Once | INTRAMUSCULAR | Status: AC
  Administered 2018-11-13: 19:00:00 75 ug via INTRAVENOUS
  Filled 2018-11-13: qty 2

## 2018-11-13 MED ORDER — TRAMADOL HCL 50 MG PO TABS: 50.0000 mg | ORAL_TABLET | Freq: Three times a day (TID) | ORAL | 0 refills | Status: DC | PRN

## 2018-11-13 MED ORDER — DIPHENHYDRAMINE HCL 50 MG/ML IJ SOLN
25.0000 mg | Freq: Once | INTRAMUSCULAR | Status: AC
  Administered 2018-11-13: 25 mg via INTRAVENOUS
  Filled 2018-11-13: qty 1

## 2018-11-13 NOTE — Discharge Instructions (Addendum)
Taken 1000 mg of Tylenol every 8 hours scheduled.  If pain does not improve take 1 tablet of Percocet at the 4-hour mark (take every 8 hours, not to exceed 3 doses).   Do not take more than 4000 mg of acetaminophen (Tylenol) in a 24-hour period. Please note that other medicines that you may be prescribed may have Tylenol as well.

## 2018-11-13 NOTE — ED Notes (Signed)
IV Vanc d/c per MD. Will give 1 dose of IV Benadryl. MD to come reassess pt.

## 2018-11-13 NOTE — Telephone Encounter (Signed)
See note

## 2018-11-13 NOTE — ED Notes (Signed)
ED TO INPATIENT HANDOFF REPORT  ED Nurse Name and Phone #: Lennard Capek   S Name/Age/Gender [name] [name] 23 y.o. female Room/Bed: 009C/009C  Code Status   Code Status: Full Code  Home/SNF/Other Home Patient oriented to: self, place, time and situation Is this baseline? Yes   Triage Complete: Triage complete  Chief Complaint Sent by doctor for CT Scan  Triage Note Pt here for ear infection in both ears x3 days. Pt was sent here for CT scan of the right ear.    Allergies No Known Allergies  Level of Care/Admitting Diagnosis ED Disposition    ED Disposition Condition [facility] Area: [place] [100100]  Level of Care: Med-Surg [16]  I expect the patient will be discharged within 24 hours: Yes  LOW acuity---Tx typically complete <24 hrs---ACUTE conditions typically can be evaluated <24 hours---LABS likely to return to acceptable levels <24 hours---IS near functional baseline---EXPECTED to return to current living arrangement---NOT newly hypoxic: Meets criteria for 5C-Observation unit  Diagnosis: Mastoiditis [242216]  Admitting Physician: Little Ishikawa [[id number]]  Attending Physician: Little Ishikawa [[id number]]  PT Class (Do Not Modify): Observation [104]  PT Acc Code (Do Not Modify): Observation [10022]       B Medical/Surgery History Past Medical History:  Diagnosis Date  . Anemia   . Fibroadenoma of left breast    needs follow up 1/20  . Fibroid   . Headache   . History of chicken pox   . Medical history non-contributory   . Streptococcus B carrier or suspected carrier    Past Surgical History:  Procedure Laterality Date  . NO PAST SURGERIES       A IV Location/Drains/Wounds Patient Lines/Drains/Airways Status   Active Line/Drains/Airways    Name:   Placement date:   Placement time:   Site:   Days:   Peripheral IV 11/13/18 Right Antecubital   11/13/18    1631    Antecubital   less than 1           Intake/Output Last 24 hours No intake or output data in the 24 hours ending 11/13/18 2030  Labs/Imaging Results for orders placed or performed during the hospital encounter of 11/13/18 (from the past 48 hour(s))  CBC with Differential     Status: Abnormal   Collection Time: 11/13/18  3:56 PM  Result Value Ref Range   WBC 10.9 (H) 4.0 - 10.5 K/uL   RBC 4.37 3.87 - 5.11 MIL/uL   Hemoglobin 12.3 12.0 - 15.0 g/dL   HCT 38.9 36.0 - 46.0 %   MCV 89.0 80.0 - 100.0 fL   MCH 28.1 26.0 - 34.0 pg   MCHC 31.6 30.0 - 36.0 g/dL   RDW 13.7 11.5 - 15.5 %   Platelets 313 150 - 400 K/uL   nRBC 0.0 0.0 - 0.2 %   Neutrophils Relative % 75 %   Neutro Abs 8.2 (H) 1.7 - 7.7 K/uL   Lymphocytes Relative 16 %   Lymphs Abs 1.8 0.7 - 4.0 K/uL   Monocytes Relative 7 %   Monocytes Absolute 0.8 0.1 - 1.0 K/uL   Eosinophils Relative 1 %   Eosinophils Absolute 0.1 0.0 - 0.5 K/uL   Basophils Relative 1 %   Basophils Absolute 0.1 0.0 - 0.1 K/uL   Immature Granulocytes 0 %   Abs Immature Granulocytes 0.04 0.00 - 0.07 K/uL    Comment: Performed at [facility], [address]  [place], [place] [address]  Basic metabolic panel     Status: Abnormal   Collection Time: 11/13/18  4:35 PM  Result Value Ref Range   Sodium 139 135 - 145 mmol/L   Potassium 3.9 3.5 - 5.1 mmol/L   Chloride 105 98 - 111 mmol/L   CO2 23 22 - 32 mmol/L   Glucose, Bld 102 (H) 70 - 99 mg/dL   BUN 9 6 - 20 mg/dL   Creatinine, Ser 0.84 0.44 - 1.00 mg/dL   Calcium 9.6 8.9 - 10.3 mg/dL   GFR calc non Af Amer >60 >60 mL/min   GFR calc Af Amer >60 >60 mL/min   Anion gap 11 5 - 15    Comment: Performed at [facility], [place] [address], [place], [place] [address]   Ct Temporal Bones W Contrast  Result Date: 11/13/2018 CLINICAL DATA:  Right ear infection EXAM: CT TEMPORAL BONES WITH CONTRAST TECHNIQUE: Axial and coronal plane CT imaging of the petrous temporal bones was performed with thin-collimation image reconstruction after  intravenous contrast administration. Multiplanar CT image reconstructions were also generated. CONTRAST:  35mL OMNIPAQUE IOHEXOL 300 MG/ML  SOLN COMPARISON:  None. FINDINGS: RIGHT: --Pinna and external auditory canal: There is moderate soft tissue thickening within the external auditory canal with mild surrounding inflammatory stranding. --Ossicular chain: There is low attenuation material surrounding the ossicles. --Tympanic membrane: Poorly visualized. --Middle ear: There is near complete opacification of the right middle ear. --Epitympanum: Epitympanum is partially opacified. Scutum remain sharp. --Cochlea, vestibule, vestibular aqueduct and semicircular canals: Normal. No evidence of canal dehiscence or otospongiosis. --Internal auditory canal: Normal. No widening of the porus acusticus. --Facial nerve: No focal abnormality along the course of the facial nerve. --Cerebellopontine angle: Normal. --Petrous Apex: Normal. --Mastoids: Partial opacification of the mastoid air cells --Carotid canal: Normal position. LEFT: --Pinna and external auditory canal: Mild soft tissue thickening of the external auditory canal. --Ossicular chain: Normal. No erosion or dislocation. --Tympanic membrane: Normal. --Middle ear: Normal. --Epitympanum: The Prussak space is clear. The scutum is sharp. Tegmen tympani is intact. --Cochlea, vestibule, vestibular aqueduct and semicircular canals: Normal. No evidence of canal dehiscence or otospongiosis. --Internal auditory canal: Normal. No widening of the porus acusticus. --Facial nerve: No focal abnormality along the course of the facial nerve. --Cerebellopontine angle: Normal. --Petrous Apex: Normal. --Mastoids: Normal. --Carotid canal: Normal position. OTHER: --Visualized intracranial: Normal. --Visualized paranasal sinuses: Normal. --Nasopharynx: Clear. --Temporomandibular joints: Normal. --Visualized extracranial soft tissues: Normal. IMPRESSION: 1. Near complete opacification of the  right middle ear and partial opacification of the right mastoid air cells, consistent with otomastoiditis. 2. Right greater than left external auditory canal soft tissue thickening, likely indicating otitis externa. 3. No left middle ear or mastoid opacification. Electronically Signed   By: Ulyses Jarred M.D.   On: 11/13/2018 17:59    Pending Labs Unresulted Labs (From admission, onward)    Start     Ordered   11/14/18 0500  Comprehensive metabolic panel  Tomorrow morning,   R     11/13/18 2012   11/13/18 2008  HIV antibody (Routine Testing)  Once,   R     11/13/18 2012          Vitals/Pain Today's Vitals   11/13/18 1830 11/13/18 1900 11/13/18 1930 11/13/18 2025  BP: 120/76 (!) 113/56 116/63   Pulse: (!) 107 (!) 106 99   Resp:      Temp:      TempSrc:      SpO2: 97%  99% 96%   Weight:      Height:      PainSc:    8     Isolation Precautions No active isolations  Medications Medications  vancomycin (VANCOCIN) 2,000 mg in sodium chloride 0.9 % 500 mL IVPB (2,000 mg Intravenous New Bag/Given 11/13/18 1954)  vancomycin (VANCOCIN) 1,500 mg in sodium chloride 0.9 % 500 mL IVPB (has no administration in time range)  ciprofloxacin (CIPRO) IVPB 200 mg (has no administration in time range)  ciprofloxacin-dexamethasone (CIPRODEX) 0.3-0.1 % OTIC (EAR) suspension 4 drop (has no administration in time range)  morphine 4 MG/ML injection 4 mg (4 mg Intravenous Given 11/13/18 2025)  HYDROcodone-acetaminophen (NORCO/VICODIN) 5-325 MG per tablet 1 tablet (has no administration in time range)  sodium chloride 0.9 % bolus 1,000 mL (0 mLs Intravenous Stopped 11/13/18 1849)  morphine 4 MG/ML injection 4 mg (4 mg Intravenous Given 11/13/18 1632)  iohexol (OMNIPAQUE) 300 MG/ML solution 75 mL (75 mLs Intravenous Contrast Given 11/13/18 1718)  HYDROmorphone (DILAUDID) injection 1 mg (1 mg Intravenous Given 11/13/18 1735)  cefTRIAXone (ROCEPHIN) 2 g in sodium chloride 0.9 % 100 mL IVPB (0 g Intravenous Stopped  11/13/18 1952)  fentaNYL (SUBLIMAZE) injection 75 mcg (75 mcg Intravenous Given 11/13/18 1910)  ciprofloxacin (CIPRO) tablet 500 mg (500 mg Oral Given 11/13/18 1951)    Mobility walks Low fall risk   Focused Assessments    R Recommendations: See Admitting Provider Note  Report given to:   Additional Notes:

## 2018-11-13 NOTE — Telephone Encounter (Signed)
Appointment scheduled.

## 2018-11-13 NOTE — ED Triage Notes (Addendum)
C/o R ear pain since Monday.  Seen in ED on 4/7 and taking RX meds without relief.

## 2018-11-13 NOTE — ED Notes (Signed)
Patient transported to CT 

## 2018-11-13 NOTE — Progress Notes (Addendum)
Patient: [name] [name] MRN: [id number] DOB: 1996/03/27 PCP: Orma Flaming, MD     I connected with [name] on 11/13/18 at 2:43 pm by a video enabled telemedicine application and verified that I am speaking with the correct person using two identifiers.  Location patient: Home Location provider: [facility], Office Persons participating in this virtual visit: Maicey Barrientez and Dr. Rogers Blocker   I discussed the limitations of evaluation and management by telemedicine and the availability of in person appointments. The patient expressed understanding and agreed to proceed.   Subjective:  Chief Complaint  Patient presents with  . Otalgia    HPI: The patient is a 23 y.o. female who presents today for right ear pain. This has been on ongoing since 4/7. She had a virtual visit with Inda Coke, PA and was given Augmentin. She then went to the ER that night and was diagnosed with otitis externa. From his exam in ER her right canal was significant edematous. Could not visualize TM due to edema. Had auricular lymphadenopathy on this side as well. She was given ciprodex and a px of percocet. She states she continued to get worse and had a fever to 102 last night.  She then was seen at a another urgent care center last night and received a shot, but can not recall name of shot. She had a 102 fever and they sent her to ER. Went back to [facility] last night. Again diagnosed with otitis externa. She had no ttp over her mastoid. Recommended she continue with ciprodex drops bilaterally. She was given IM toradol and discharged.   She tells me today the pain is getting worse, she continues to have fever and she now has ttp "over that bone behind my right ear." She states the entire right side of her face/cheek is now red and swollen and tender to touch. She continues to use drops, augmentin and another wick was placed in ER last night. She denies any tooth pain, pain with chewing. Does have hearing loss in  right ear and continues to have drainage.   Review of Systems  Constitutional: Positive for chills and fever.       Temp currently 100F axillary  HENT: Positive for ear pain and facial swelling. Negative for congestion, postnasal drip, rhinorrhea, sinus pressure, sinus pain and sore throat.        B/l ear pain R>L C/o right sided facial swelling  Respiratory: Negative for cough and shortness of breath.   Cardiovascular: Negative for chest pain.  Gastrointestinal: Positive for nausea. Negative for abdominal pain.  Musculoskeletal: Negative for myalgias.  Neurological: Positive for headaches. Negative for dizziness.  Psychiatric/Behavioral: Positive for sleep disturbance.    Allergies Patient has No Known Allergies.  Past Medical History Patient  has a past medical history of Anemia, Fibroadenoma of left breast, Fibroid, Headache, History of chicken pox, Medical history non-contributory, and Streptococcus B carrier or suspected carrier.  Surgical History Patient  has a past surgical history that includes No past surgeries.  Family History Pateint's family history includes COPD in her maternal grandmother; Healthy in her mother; Hyperlipidemia in her father; Hypertension in her father; Mental retardation in her brother.  Social History Patient  reports that she quit smoking about 8 years ago. She has never used smokeless tobacco. She reports that she does not drink alcohol or use drugs.    Objective: Vitals:   11/13/18 1432  Temp: 100 F (37.8 C)  TempSrc: Axillary  Weight: 286  lb (129.7 kg)  Height: 5\' 7"  (1.702 m)    Body mass index is 44.79 kg/m.  Physical Exam Vitals signs reviewed.  Constitutional:      Appearance: She is obese. She is ill-appearing (in tears from pain ).  HENT:     Head: Normocephalic and atraumatic.     Comments: Right side of her face is erythematous and edematous.  Eyes:     Extraocular Movements: Extraocular movements intact.  Pulmonary:      Effort: Pulmonary effort is normal.  Neurological:     General: No focal deficit present.     Mental Status: She is alert and oriented to person, place, and time.        Assessment/plan: 1. Acute diffuse otitis externa of right ear Concern for malignant otitis externa. Now has pain over mastoid and continued fever with erythema and edema of face. Needs CT of head. Recommended return to ER for this as this is emergent. Also sending in cipro to start if they discharge her home to start in addition to augmentin to cover for pseudomonas. Stat referral placed to ENT as well. Requesting pain medication as well. Will not do percocet, sent in 20 of tramadol.  - Ambulatory referral to ENT   Return if symptoms worsen or fail to improve.     Orma Flaming, MD [place]  11/13/2018

## 2018-11-13 NOTE — Telephone Encounter (Signed)
Please see msg and advise.  

## 2018-11-13 NOTE — ED Notes (Signed)
Pt c/o severe itching to her whole head. MD paged.

## 2018-11-13 NOTE — H&P (Addendum)
History and Physical    [name] [name] [id number] DOB: 12/06/1995 DOA: 11/13/2018  PCP: Orma Flaming, MD   Patient coming from: Home  Chief Complaint: Fevers, chills, ear pain  HPI: [name] [name] is a 23 y.o. female with no medical history who presents with the right ear pain, fever, chills.  She has been seen previously by PCP for what appears to be otitis externa, having failed 3 days of Augmentin with worsening fever and symptoms.  Evaluated at urgent care with fever as high as 102 subsequently sent to ED for further evaluation and treatment.  Patient does admit to fever and chills as above denies nausea, vomiting, diarrhea, constipation, headache, difficulty swallowing  ED Course: In the ED patient received 1 L normal saline, 1 mg morphine, 75 mcg fentanyl, 1 mg Dilaudid well as vancomycin and ceftriaxone x1 dose each.  Initially tachycardic in the setting of pain but vitals otherwise unremarkable.  Afebrile here although had Tylenol just prior to admission.  After outpatient failure of therapy and consulting ENT who recommended Cipro PO as well as Otic route - hospitalist was consulted for admission to ongoing uncontrolled pain adding of patient failure of antibiotic treatment  Review of Systems: As per HPI otherwise 10 point review of systems negative.   Past Medical History:  Diagnosis Date   Anemia    Fibroadenoma of left breast    needs follow up 1/20   Fibroid    Headache    History of chicken pox    Medical history non-contributory    Streptococcus B carrier or suspected carrier     Past Surgical History:  Procedure Laterality Date   NO PAST SURGERIES       reports that she quit smoking about 8 years ago. She has never used smokeless tobacco. She reports that she does not drink alcohol or use drugs.  No Known Allergies  Family History  Problem Relation Age of Onset   Healthy Mother    Hypertension Father    Hyperlipidemia Father    Mental  retardation Brother    COPD Maternal Grandmother     Prior to Admission medications   Medication Sig Start Date End Date Taking? Authorizing Provider  acetaminophen (TYLENOL) 325 MG tablet Take 650 mg by mouth every 6 (six) hours as needed for mild pain or fever.   Yes [provider]  amoxicillin-clavulanate (AUGMENTIN) 875-125 MG tablet Take 1 tablet by mouth 2 (two) times daily. 11/11/18  Yes Worley, Aldona Bar, PA  ciprofloxacin-dexamethasone (CIPRODEX) OTIC suspension Place 4 drops into both ears 2 (two) times daily for 7 days. 11/13/18 11/20/18 Yes Cardama, Grayce Sessions, MD  ibuprofen (ADVIL,MOTRIN) 200 MG tablet Take 200 mg by mouth every 6 (six) hours as needed for fever or mild pain.   Yes [provider]  oxyCODONE-acetaminophen (PERCOCET/ROXICET) 5-325 MG tablet Take 1 tablet by mouth every 8 (eight) hours as needed for severe pain. 11/12/18  Yes McDonald, Mia A, PA-C  ciprofloxacin (CIPRO) 500 MG tablet Take 1 tablet (500 mg total) by mouth 2 (two) times daily. Patient not taking: Reported on 11/13/2018 11/13/18   Orma Flaming, MD  traMADol (ULTRAM) 50 MG tablet Take 1 tablet (50 mg total) by mouth every 8 (eight) hours as needed. 11/13/18   Orma Flaming, MD    Physical Exam: Vitals:   11/13/18 1800 11/13/18 1830 11/13/18 1900 11/13/18 1930  BP: 120/75 120/76 (!) 113/56 116/63  Pulse: 92 (!) 107 (!) 106 99  Resp:  Temp:      TempSrc:      SpO2: 96% 97% 99% 96%  Weight:      Height:        Constitutional: NAD, calm, comfortable Vitals:   11/13/18 1800 11/13/18 1830 11/13/18 1900 11/13/18 1930  BP: 120/75 120/76 (!) 113/56 116/63  Pulse: 92 (!) 107 (!) 106 99  Resp:      Temp:      TempSrc:      SpO2: 96% 97% 99% 96%  Weight:      Height:       General: Pleasant obese 23 year old female sitting up in bed, mild distress secondary to pain HEENT: Itching erythema over right lateral posterior aspect of face and head marked tenderness with manipulation of  ear and palpation of periaural area Neck:  Without mass or deformity.  Trachea is midline. Lungs:  Clear to auscultate bilaterally without rhonchi, wheeze, or rales. Heart:  Regular rate and rhythm.  Without murmurs, rubs, or gallops. Abdomen:  Soft, nontender, nondistended.  Without guarding or rebound. Extremities: Without cyanosis, clubbing, edema, or obvious deformity. Vascular:  Dorsalis pedis and posterior tibial pulses palpable bilaterally. Skin: Indicated area above - warm and dry, no erythema, no ulcerations.   Labs on Admission: I have personally reviewed following labs and imaging studies  CBC: Recent Labs  Lab 11/13/18 1556  WBC 10.9*  NEUTROABS 8.2*  HGB 12.3  HCT 38.9  MCV 89.0  PLT 937   Basic Metabolic Panel: Recent Labs  Lab 11/13/18 1635  NA 139  K 3.9  CL 105  CO2 23  GLUCOSE 102*  BUN 9  CREATININE 0.84  CALCIUM 9.6   GFR: Estimated Creatinine Clearance: 143.5 mL/min (by C-G formula based on SCr of 0.84 mg/dL). Liver Function Tests: No results for input(s): AST, ALT, ALKPHOS, BILITOT, PROT, ALBUMIN in the last 168 hours. No results for input(s): LIPASE, AMYLASE in the last 168 hours. No results for input(s): AMMONIA in the last 168 hours. Coagulation Profile: No results for input(s): INR, PROTIME in the last 168 hours. Cardiac Enzymes: No results for input(s): CKTOTAL, CKMB, CKMBINDEX, TROPONINI in the last 168 hours. BNP (last 3 results) No results for input(s): PROBNP in the last 8760 hours. HbA1C: No results for input(s): HGBA1C in the last 72 hours. CBG: No results for input(s): GLUCAP in the last 168 hours. Lipid Profile: No results for input(s): CHOL, HDL, LDLCALC, TRIG, CHOLHDL, LDLDIRECT in the last 72 hours. Thyroid Function Tests: No results for input(s): TSH, T4TOTAL, FREET4, T3FREE, THYROIDAB in the last 72 hours. Anemia Panel: No results for input(s): VITAMINB12, FOLATE, FERRITIN, TIBC, IRON, RETICCTPCT in the last 72  hours. Urine analysis:    Component Value Date/Time   COLORURINE RED (A) 05/01/2018 1423   APPEARANCEUR CLOUDY (A) 05/01/2018 1423   LABSPEC 1.012 05/01/2018 1423   PHURINE 8.0 05/01/2018 1423   GLUCOSEU NEGATIVE 05/01/2018 1423   HGBUR LARGE (A) 05/01/2018 1423   BILIRUBINUR NEGATIVE 05/01/2018 1423   KETONESUR NEGATIVE 05/01/2018 1423   PROTEINUR 100 (A) 05/01/2018 1423   UROBILINOGEN 0.2 10/25/2014 0442   NITRITE NEGATIVE 05/01/2018 1423   LEUKOCYTESUR LARGE (A) 05/01/2018 1423    Radiological Exams on Admission: Ct Temporal Bones W Contrast  Result Date: 11/13/2018 CLINICAL DATA:  Right ear infection EXAM: CT TEMPORAL BONES WITH CONTRAST TECHNIQUE: Axial and coronal plane CT imaging of the petrous temporal bones was performed with thin-collimation image reconstruction after intravenous contrast administration. Multiplanar CT image reconstructions were also generated.  CONTRAST:  51mL OMNIPAQUE IOHEXOL 300 MG/ML  SOLN COMPARISON:  None. FINDINGS: RIGHT: --Pinna and external auditory canal: There is moderate soft tissue thickening within the external auditory canal with mild surrounding inflammatory stranding. --Ossicular chain: There is low attenuation material surrounding the ossicles. --Tympanic membrane: Poorly visualized. --Middle ear: There is near complete opacification of the right middle ear. --Epitympanum: Epitympanum is partially opacified. Scutum remain sharp. --Cochlea, vestibule, vestibular aqueduct and semicircular canals: Normal. No evidence of canal dehiscence or otospongiosis. --Internal auditory canal: Normal. No widening of the porus acusticus. --Facial nerve: No focal abnormality along the course of the facial nerve. --Cerebellopontine angle: Normal. --Petrous Apex: Normal. --Mastoids: Partial opacification of the mastoid air cells --Carotid canal: Normal position. LEFT: --Pinna and external auditory canal: Mild soft tissue thickening of the external auditory canal.  --Ossicular chain: Normal. No erosion or dislocation. --Tympanic membrane: Normal. --Middle ear: Normal. --Epitympanum: The Prussak space is clear. The scutum is sharp. Tegmen tympani is intact. --Cochlea, vestibule, vestibular aqueduct and semicircular canals: Normal. No evidence of canal dehiscence or otospongiosis. --Internal auditory canal: Normal. No widening of the porus acusticus. --Facial nerve: No focal abnormality along the course of the facial nerve. --Cerebellopontine angle: Normal. --Petrous Apex: Normal. --Mastoids: Normal. --Carotid canal: Normal position. OTHER: --Visualized intracranial: Normal. --Visualized paranasal sinuses: Normal. --Nasopharynx: Clear. --Temporomandibular joints: Normal. --Visualized extracranial soft tissues: Normal. IMPRESSION: 1. Near complete opacification of the right middle ear and partial opacification of the right mastoid air cells, consistent with otomastoiditis. 2. Right greater than left external auditory canal soft tissue thickening, likely indicating otitis externa. 3. No left middle ear or mastoid opacification. Electronically Signed   By: Ulyses Jarred M.D.   On: 11/13/2018 17:59    EKG: Not performed  Assessment/Plan Principal Problem:   Otitis externa Active Problems:   Obesity (BMI 35.0-39.9 without comorbidity)   Mastoiditis   Sepsis  secondary to otitis externa/mastoiditis, POA -failure of outpatient therapy -Patient technically meets sepsis criteria given leukocytosis and tachycardia at admission with known infection, does not appear septic exam -Failed 3 days(5 doses) of Augmentin in the outpatient setting worsening symptoms despite medical compliance -Received vancomycin/ceftriaxone x1 dose in the ED -Discontinued vancomycin, patient complaining of itching at infectious site, unlikely reaction however she requested we stop IV antibiotics - have given IV dose of benadryl x1; start PO benadryl PRN itching -ENT consulted, Dr. Redmond Baseman  recommended Cipro both IV and otic drops -Pending clinical improvement suspect discharge home in 24-48 hours  Intractable pain secondary to above -Patient previously given morphine, fentanyl, Dilaudid as above -Continue IV morphine to cover PRN severe breakthrough pain -Start low dose Norco to cover patient's moderate pain - her main complaint is short duration of IV pain medications -continue to follow clinically  Obesity, morbid -Lengthy discussion at bedside for ongoing need for dietary and lifestyle changes   DVT prophylaxis: None, early ambulation, low risk Code Status: Full Family Communication: None present Disposition Plan: Discharge home, likely tomorrow clinical improvement Consults called: ENT, Dr. Redmond Baseman Admission status: Observation  Little Ishikawa DO [facility]  If 7PM-7AM, please contact night-coverage [email or web address] Password [email or web address]  11/13/2018, 8:02 PM

## 2018-11-13 NOTE — Addendum Note (Signed)
Addended by: Orma Flaming on: 11/13/2018 05:50 PM   Modules accepted: Orders

## 2018-11-13 NOTE — Progress Notes (Signed)
Pharmacy Antibiotic Note  [name] [name] is a 23 y.o. female admitted on 11/13/2018 with otomastoiditis.  Pharmacy has been consulted for vancomycin dosing.  Pt is Tmax of 100 and WBC 10.9. Scr is WNL.   Plan: Vancomycin 2gm IV x 1 then 1500mg  IV Q12H F/u renal fxn, C&S, clinical status and peak/trough at SS  Height: 5\' 7"  (170.2 cm) Weight: 286 lb (129.7 kg) IBW/kg (Calculated) : 61.6  Temp (24hrs), Avg:98.7 F (37.1 C), Min:97.9 F (36.6 C), Max:100 F (37.8 C)  Recent Labs  Lab 11/13/18 1556 11/13/18 1635  WBC 10.9*  --   CREATININE  --  0.84    Estimated Creatinine Clearance: 143.5 mL/min (by C-G formula based on SCr of 0.84 mg/dL).    No Known Allergies  Antimicrobials this admission: Vanc 4/9>> CTX x 1 4/9  Dose adjustments this admission: N/A  Microbiology results: Pending  Thank you for allowing pharmacy to be a part of this patient's care.  Susy Placzek, Rande Lawman 11/13/2018 6:28 PM

## 2018-11-13 NOTE — ED Provider Notes (Signed)
[facility] EMERGENCY DEPARTMENT Provider Note  CSN: [id number] Arrival date & time: 11/13/18 0019  Chief Complaint(s) Otalgia  HPI [name] [name] is a 23 y.o. female who presents with several days of right otalgia.  Patient was seen here yesterday and diagnosed with otitis externa.  She had a wick placed and given Ciprodex.  Patient is already on amoxicillin that was prescribed by her primary care doctor.  She is on day 2 of the amoxicillin.  She returns today for persistent pain despite Percocet.  She went to urgent care who sent her here for further evaluation.  Patient reports that she had a temperature of 102 at urgent care.  Denies any nausea or vomiting.  No nuchal rigidity.  Endorses severe right ear pain that is exacerbated with palpation.  No headache.  Denies any other physical complaints.  HPI  Past Medical History Past Medical History:  Diagnosis Date  . Anemia   . Fibroadenoma of left breast    needs follow up 1/20  . Fibroid   . Headache   . History of chicken pox   . Medical history non-contributory   . Streptococcus B carrier or suspected carrier    Patient Active Problem List   Diagnosis Date Noted  . Postpartum anemia 04/25/2018  . Chronic migraine 12/13/2015  . Low back pain 12/13/2015  . Obesity (BMI 35.0-39.9 without comorbidity) 11/01/2015   Home Medication(s) Prior to Admission medications   Medication Sig Start Date End Date Taking? Authorizing Provider  acetaminophen (TYLENOL) 325 MG tablet Take 650 mg by mouth every 6 (six) hours as needed for mild pain or fever.    [provider]  amoxicillin-clavulanate (AUGMENTIN) 875-125 MG tablet Take 1 tablet by mouth 2 (two) times daily. 11/11/18   Inda Coke, PA  ciprofloxacin-dexamethasone (CIPRODEX) OTIC suspension Place 4 drops into both ears 2 (two) times daily for 7 days. 11/13/18 11/20/18  Fatima Blank, MD  ibuprofen (ADVIL,MOTRIN) 200 MG tablet Take 200 mg by mouth  every 6 (six) hours as needed for fever or mild pain.    [provider]  oxyCODONE-acetaminophen (PERCOCET/ROXICET) 5-325 MG tablet Take 1 tablet by mouth every 8 (eight) hours as needed for severe pain. 11/12/18   McDonald, Laymond Purser, PA-C                                                                                                                                    Past Surgical History Past Surgical History:  Procedure Laterality Date  . NO PAST SURGERIES     Family History Family History  Problem Relation Age of Onset  . Healthy Mother   . Hypertension Father   . Hyperlipidemia Father   . Mental retardation Brother   . COPD Maternal Grandmother     Social History Social History   Tobacco Use  . Smoking status: Former Smoker    Last attempt to  quit: 10/25/2010    Years since quitting: 8.0  . Smokeless tobacco: Never Used  Substance Use Topics  . Alcohol use: No    Comment: socially  . Drug use: No   Allergies Patient has no known allergies.  Review of Systems Review of Systems As noted in HPI Physical Exam Vital Signs  I have reviewed the triage vital signs BP 123/65 (BP Location: Left Arm)   Pulse 96   Temp 97.9 F (36.6 C) (Temporal)   Resp 16   LMP 10/21/2018 (Approximate)   SpO2 100%   Physical Exam Vitals signs reviewed.  Constitutional:      General: She is not in acute distress.    Appearance: She is well-developed. She is not diaphoretic.  HENT:     Head: Normocephalic and atraumatic.     Right Ear: Tympanic membrane normal. Drainage, swelling and tenderness present. No mastoid tenderness.     Left Ear: Tympanic membrane normal. Drainage present. No mastoid tenderness.     Nose: Nose normal.  Eyes:     General: No scleral icterus.    Conjunctiva/sclera: Conjunctivae normal.  Neck:     Musculoskeletal: Normal range of motion.     Trachea: Phonation normal.  Cardiovascular:     Rate and Rhythm: Normal rate and regular rhythm.  Pulmonary:      Effort: Pulmonary effort is normal. No respiratory distress.     Breath sounds: No stridor.  Abdominal:     General: There is no distension.  Musculoskeletal: Normal range of motion.  Neurological:     Mental Status: She is alert and oriented to person, place, and time.  Psychiatric:        Behavior: Behavior normal.     ED Results and Treatments Labs (all labs ordered are listed, but only abnormal results are displayed) Labs Reviewed - No data to display                                                                                                                       EKG  EKG Interpretation  Date/Time:    Ventricular Rate:    PR Interval:    QRS Duration:   QT Interval:    QTC Calculation:   R Axis:     Text Interpretation:        Radiology No results found. Pertinent labs & imaging results that were available during my care of the patient were reviewed by me and considered in my medical decision making (see chart for details).  Medications Ordered in ED Medications  ketorolac (TORADOL) injection 30 mg (30 mg Intramuscular Given 11/13/18 0212)  Procedures Procedures  (including critical care time)  Medical Decision Making / ED Course I have reviewed the nursing notes for this encounter and the patient's prior records (if available in EHR or on provided paperwork).    Patient presents with right otalgia.  Consistent with otitis externa.  No mastoid tenderness concerning for mastoiditis.  Additionally I noted discharge in the left external ear canal with mild irritation.  Recommended she use the Ciprodex drops bilaterally.  Will prescribe additional drops.  Since urgent care remove the wick, I replaced it.  She was given IM Toradol for pain relief.  The patient appears reasonably screened and/or stabilized for discharge and I doubt any  other medical condition or other [facility] requiring further screening, evaluation, or treatment in the ED at this time prior to discharge.  The patient is safe for discharge with strict return precautions.   Final Clinical Impression(s) / ED Diagnoses Final diagnoses:  Other infective acute otitis externa of both ears    Disposition: Discharge  Condition: Good  I have discussed the results, Dx and Tx plan with the patient who expressed understanding and agree(s) with the plan. Discharge instructions discussed at great length. The patient was given strict return precautions who verbalized understanding of the instructions. No further questions at time of discharge.    ED Discharge Orders         Ordered    ciprofloxacin-dexamethasone (CIPRODEX) OTIC suspension  2 times daily     11/13/18 0218           Follow Up: Orma Flaming, [place] [id number] [phone]  Schedule an appointment as soon as possible for a visit  in 3-5 days, If symptoms do not improve or  worsen     This chart was dictated using voice recognition software.  Despite best efforts to proofread,  errors can occur which can change the documentation meaning.   Fatima Blank, MD 11/13/18 [phone]

## 2018-11-13 NOTE — ED Triage Notes (Signed)
Pt here for ear infection in both ears x3 days. Pt was sent here for CT scan of the right ear.

## 2018-11-13 NOTE — Telephone Encounter (Signed)
Copied from [place] [id number]. Topic: Quick Communication - See Telephone Encounter >> Nov 13, 2018 12:00 PM Margot Ables wrote: CRM for notification. See Telephone encounter for: 11/13/18. Pt called in stating she has done virtual visit 4/7 with Aldona Bar, been to urgent care, and been to ER for right ear infection. Pt states that she is in so much pain. She was advised to continue the amoxicillin, ER ordered CIPRODEX ear drops, pt had a toradol injection in ER, and pt was given percocet 5mg  which she said isn't touching the pain. Pt states she went to [facility] yesterday 5pm and they gave her an antibiotic injection but she doesn't know the name. Pt asking for something to help the pain.

## 2018-11-13 NOTE — Telephone Encounter (Signed)
Patient called and says she is still having right ear pain and this morning the right side of her face is red, hot to the touch. She says she was seen by Aldona Bar on Tuesday and was given an antibiotic to take. She says since then she's been to an UC and the ED, which the ED gave her ear drops and Percocet 5 mg for the pain. She says the pain medicine is not working and she needs to see someone today. She says her temperature yesterday was 102 and today it's 99.5. She denies any other symptoms. I placed her on hold and called the office, I spoke to [place], [facility] who asked to speak to the patient. The call was connected and transferred successfully.   Answer Assessment - Initial Assessment Questions 1. MAIN CONCERN OR SYMPTOM:  "What is your main concern right now?" "What question do you have?" "What's the main symptom you're worried about?" (e.g., breathing difficulty, cough, fever. pain)     Right side face is red, hot to touch 2. ONSET: "When did the redness start?"     Today 3. BETTER-SAME-WORSE: "Are you getting better, staying the same, or getting worse compared to how you felt at your last visit to the doctor (most recent medical visit)"?     Worse 4. VISIT DATE: "When were you seen?" (Date)     This past Tuesday 5. VISIT DOCTOR: "What is the name of the doctor taking care of you now?"     Inda Coke, [place] 6. VISIT DIAGNOSIS:  "What was the main symptom or problem that you were seen for?" "Were you given a diagnosis?"      Ear pain 7. VISIT MEDICATIONS: "Did the physician order any new medicines for you to use?" If yes: "Have you filled the prescription and started taking the medicine?"      Antibiotics 8. NEXT APPOINTMENT: "Have you scheduled a follow-up appointment with your doctor?"     No 9. PAIN: "Is there any pain?" If so, ask: "How bad is it?"  (Scale 1-10; or mild, moderate, severe)     8 10. FEVER: "Do you have a fever?" If so, ask: "What is it, how was it measured  and when did  it start?"      99.5 today, 102 yesterday 11. OTHER SYMPTOMS: "Do you have any other symptoms?"      No  Protocols used: RECENT MEDICAL VISIT FOR ILLNESS FOLLOW-UP CALL-A-AH

## 2018-11-13 NOTE — ED Provider Notes (Signed)
[facility] EM[facility] DEPARTMENT Provider Note   CSN: [id number] Arrival date & time: 11/13/18  1530    History   Chief Complaint No chief complaint on file.   HPI [name] [name] is a 23 y.o. female presenting for evaluation of right ear pain and facial swelling.  Patient states that the past 2 days, she has had worsening right ear pain and swelling.  She has been seen twice in the ED, and twice with tele-visits.  She started on Augmentin 2 days ago, Ciprodex drops 2 days ago.  Patient reports worsening of symptoms, today she developed pain at her mastoid, pain with opening her jaw, and facial redness and swelling.  He has been taking Percocet, Tylenol, and ibuprofen without improvement of symptoms.  Patient reports no significant history of ear infections or ear trauma.  No recent piercings.  No symptoms on the left side.  She denies fevers, chills, nasal congestion, sore throat, cough.  Patient states he has no other medical problems, takes no medications daily.     HPI  Past Medical History:  Diagnosis Date  . Anemia   . Fibroadenoma of left breast    needs follow up 1/20  . Fibroid   . Headache   . History of chicken pox   . Medical history non-contributory   . Streptococcus B carrier or suspected carrier     Patient Active Problem List   Diagnosis Date Noted  . Otitis externa 11/13/2018  . Mastoiditis 11/13/2018  . Postpartum anemia 04/25/2018  . Chronic migraine 12/13/2015  . Low back pain 12/13/2015  . Obesity (BMI 35.0-39.9 without comorbidity) 11/01/2015    Past Surgical History:  Procedure Laterality Date  . NO PAST SURGERIES       OB History    Gravida  2   Para  2   Term  2   Preterm  0   AB  0   Living  2     SAB  0   TAB  0   Ectopic  0   Multiple  0   Live Births  2            Home Medications    Prior to Admission medications   Medication Sig Start Date End Date Taking? Authorizing Provider   acetaminophen (TYLENOL) 325 MG tablet Take 650 mg by mouth every 6 (six) hours as needed for mild pain or fever.   Yes [provider]  amoxicillin-clavulanate (AUGMENTIN) 875-125 MG tablet Take 1 tablet by mouth 2 (two) times daily. 11/11/18  Yes Worley, Aldona Bar, PA  ciprofloxacin-dexamethasone (CIPRODEX) OTIC suspension Place 4 drops into both ears 2 (two) times daily for 7 days. 11/13/18 11/20/18 Yes Cardama, Grayce Sessions, MD  ibuprofen (ADVIL,MOTRIN) 200 MG tablet Take 200 mg by mouth every 6 (six) hours as needed for fever or mild pain.   Yes [provider]  oxyCODONE-acetaminophen (PERCOCET/ROXICET) 5-325 MG tablet Take 1 tablet by mouth every 8 (eight) hours as needed for severe pain. 11/12/18  Yes McDonald, Mia A, PA-C  ciprofloxacin (CIPRO) 500 MG tablet Take 1 tablet (500 mg total) by mouth 2 (two) times daily. Patient not taking: Reported on 11/13/2018 11/13/18   Orma Flaming, MD  traMADol (ULTRAM) 50 MG tablet Take 1 tablet (50 mg total) by mouth every 8 (eight) hours as needed. 11/13/18   Orma Flaming, MD    Family History Family History  Problem Relation Age of Onset  . Healthy Mother   .  Hypertension Father   . Hyperlipidemia Father   . Mental retardation Brother   . COPD Maternal Grandmother     Social History Social History   Tobacco Use  . Smoking status: Former Smoker    Last attempt to quit: 10/25/2010    Years since quitting: 8.0  . Smokeless tobacco: Never Used  Substance Use Topics  . Alcohol use: No    Comment: socially  . Drug use: No     Allergies   Patient has no known allergies.   Review of Systems Review of Systems  HENT: Positive for ear pain and facial swelling.   All other systems reviewed and are negative.    Physical Exam Updated Vital Signs BP (!) 142/77 (BP Location: Left Arm)   Pulse (!) 115   Temp 98.9 F (37.2 C) (Oral)   Resp 20   Ht 5\' 7"  (1.702 m)   Wt 129.7 kg   LMP 10/21/2018 (Approximate)   SpO2 100%    BMI 44.79 kg/m   Physical Exam Vitals signs and nursing note reviewed.  Constitutional:      General: She is not in acute distress.    Appearance: She is well-developed.     Comments: Appears uncomfortable due to pain  HENT:     Head: Normocephalic and atraumatic.     Ears:     Comments: Right ear canal completely swollen and occluded. Unable to visualize R TM.  Tenderness palpation of mastoid and postauricular lymph nodes.  Right-sided facial swelling and erythema.  No trismus. Eyes:     Extraocular Movements: Extraocular movements intact.     Conjunctiva/sclera: Conjunctivae normal.     Pupils: Pupils are equal, round, and reactive to light.     Comments: EOMI and perrla. No entrapment, nystagmus, or pain  Neck:     Musculoskeletal: Normal range of motion and neck supple.  Cardiovascular:     Rate and Rhythm: Regular rhythm. Tachycardia present.     Pulses: Normal pulses.  Pulmonary:     Effort: Pulmonary effort is normal. No respiratory distress.     Breath sounds: Normal breath sounds. No wheezing.  Abdominal:     General: There is no distension.     Palpations: Abdomen is soft. There is no mass.     Tenderness: There is no abdominal tenderness. There is no guarding or rebound.  Musculoskeletal: Normal range of motion.  Skin:    General: Skin is warm and dry.     Capillary Refill: Capillary refill takes less than 2 seconds.  Neurological:     Mental Status: She is alert and oriented to person, place, and time.      ED Treatments / Results  Labs (all labs ordered are listed, but only abnormal results are displayed) Labs Reviewed  CBC WITH DIFFERENTIAL/PLATELET - Abnormal; Notable for the following components:      Result Value   WBC 10.9 (*)    Neutro Abs 8.2 (*)    All other components within normal limits  BASIC METABOLIC PANEL - Abnormal; Notable for the following components:   Glucose, Bld 102 (*)    All other components within normal limits  HIV ANTIBODY  (ROUTINE TESTING W REFLEX)  COMPREHENSIVE METABOLIC PANEL    EKG None  Radiology Ct Temporal Bones W Contrast  Result Date: 11/13/2018 CLINICAL DATA:  Right ear infection EXAM: CT TEMPORAL BONES WITH CONTRAST TECHNIQUE: Axial and coronal plane CT imaging of the petrous temporal bones was performed with thin-collimation image  reconstruction after intravenous contrast administration. Multiplanar CT image reconstructions were also generated. CONTRAST:  83mL OMNIPAQUE IOHEXOL 300 MG/ML  SOLN COMPARISON:  None. FINDINGS: RIGHT: --Pinna and external auditory canal: There is moderate soft tissue thickening within the external auditory canal with mild surrounding inflammatory stranding. --Ossicular chain: There is low attenuation material surrounding the ossicles. --Tympanic membrane: Poorly visualized. --Middle ear: There is near complete opacification of the right middle ear. --Epitympanum: Epitympanum is partially opacified. Scutum remain sharp. --Cochlea, vestibule, vestibular aqueduct and semicircular canals: Normal. No evidence of canal dehiscence or otospongiosis. --Internal auditory canal: Normal. No widening of the porus acusticus. --Facial nerve: No focal abnormality along the course of the facial nerve. --Cerebellopontine angle: Normal. --Petrous Apex: Normal. --Mastoids: Partial opacification of the mastoid air cells --Carotid canal: Normal position. LEFT: --Pinna and external auditory canal: Mild soft tissue thickening of the external auditory canal. --Ossicular chain: Normal. No erosion or dislocation. --Tympanic membrane: Normal. --Middle ear: Normal. --Epitympanum: The Prussak space is clear. The scutum is sharp. Tegmen tympani is intact. --Cochlea, vestibule, vestibular aqueduct and semicircular canals: Normal. No evidence of canal dehiscence or otospongiosis. --Internal auditory canal: Normal. No widening of the porus acusticus. --Facial nerve: No focal abnormality along the course of the facial  nerve. --Cerebellopontine angle: Normal. --Petrous Apex: Normal. --Mastoids: Normal. --Carotid canal: Normal position. OTHER: --Visualized intracranial: Normal. --Visualized paranasal sinuses: Normal. --Nasopharynx: Clear. --Temporomandibular joints: Normal. --Visualized extracranial soft tissues: Normal. IMPRESSION: 1. Near complete opacification of the right middle ear and partial opacification of the right mastoid air cells, consistent with otomastoiditis. 2. Right greater than left external auditory canal soft tissue thickening, likely indicating otitis externa. 3. No left middle ear or mastoid opacification. Electronically Signed   By: Ulyses Jarred M.D.   On: 11/13/2018 17:59    Procedures Procedures (including critical care time)  Medications Ordered in ED Medications  vancomycin (VANCOCIN) 1,500 mg in sodium chloride 0.9 % 500 mL IVPB (has no administration in time range)  ciprofloxacin (CIPRO) IVPB 200 mg (has no administration in time range)  ciprofloxacin-dexamethasone (CIPRODEX) 0.3-0.1 % OTIC (EAR) suspension 4 drop (has no administration in time range)  morphine 4 MG/ML injection 4 mg (4 mg Intravenous Given 11/13/18 2025)  HYDROcodone-acetaminophen (NORCO/VICODIN) 5-325 MG per tablet 1 tablet (1 tablet Oral Given 11/13/18 2127)  diphenhydrAMINE (BENADRYL) capsule 25 mg (has no administration in time range)  sodium chloride 0.9 % bolus 1,000 mL (0 mLs Intravenous Stopped 11/13/18 1849)  morphine 4 MG/ML injection 4 mg (4 mg Intravenous Given 11/13/18 1632)  iohexol (OMNIPAQUE) 300 MG/ML solution 75 mL (75 mLs Intravenous Contrast Given 11/13/18 1718)  HYDROmorphone (DILAUDID) injection 1 mg (1 mg Intravenous Given 11/13/18 1735)  cefTRIAXone (ROCEPHIN) 2 g in sodium chloride 0.9 % 100 mL IVPB (0 g Intravenous Stopped 11/13/18 1952)  vancomycin (VANCOCIN) 2,000 mg in sodium chloride 0.9 % 500 mL IVPB (0 mg Intravenous Stopped 11/13/18 2040)  fentaNYL (SUBLIMAZE) injection 75 mcg (75 mcg Intravenous  Given 11/13/18 1910)  ciprofloxacin (CIPRO) tablet 500 mg (500 mg Oral Given 11/13/18 1951)  diphenhydrAMINE (BENADRYL) injection 25 mg (25 mg Intravenous Given 11/13/18 2055)     Initial Impression / Assessment and Plan / ED Course  I have reviewed the triage vital signs and the nursing notes.  Pertinent labs & imaging results that were available during my care of the patient were reviewed by me and considered in my medical decision making (see chart for details).        Patient presenting  for evaluation of worsening ear pain and facial swelling while on antibiotics and eardrops.  Physical exam concerning, significant swelling of the ear canal, tenderness of the mastoid, and facial swelling.  No pain with movement of the eyes, doubt orbital cellulitis.  Concern for malignant otitis externa, mastoiditis, or abscess.  Will obtain labs and CT for further evaluation.  Fluid bolus and morphine for symptom control.  Patient reports mild improvement with morphine, but returning to severe pain.  Will give milligram of Dilaudid.  Labs with mild leukocytosis of 10.9.  Otherwise reassuring.  CT pending.  CT concerning for otitis externa and otomastoiditis.  Case discussed with attending, Dr. Sherry Ruffing agrees to plan.  Will consult with ENT, and likely admit to the hospitalist.  Patient reports pain is returning, will give 75 mg of fentanyl.  Discussed with Dr. Redmond Baseman from ENT, who reviewed the imaging.  Feels symptoms are more due to otitis externa, and less related to mastoiditis.  Recommends systemic Cipro and Cipro drops.  Recommends ear wick, with removal in his office on Monday 3/13.  Recommends patient to go home if she feels comfortable, however can be admitted if needed.  I discussed findings and plan with patient.  As this is patient's third visit, she does not feel comfortable being discharged, she is concerned symptoms will worsen.  Considering rapidity of worsening symptoms and frequent visits in the  past 48 hours, I believe patient will benefit from hospitalization.  Will call for admission.   Discussed with Dr. Avon Gully [facility], patient to be admitted.   Final Clinical Impressions(s) / ED Diagnoses   Final diagnoses:  Infective otitis externa of right ear  Mastoiditis of right side    ED Discharge Orders    None       Franchot Heidelberg, PA-C 11/13/18 2218    Tegeler, Gwenyth Allegra, MD 11/15/18 [phone]

## 2018-11-14 DIAGNOSIS — Z8249 Family history of ischemic heart disease and other diseases of the circulatory system: Secondary | ICD-10-CM | POA: Diagnosis not present

## 2018-11-14 DIAGNOSIS — H60313 Diffuse otitis externa, bilateral: Secondary | ICD-10-CM | POA: Diagnosis not present

## 2018-11-14 DIAGNOSIS — Z825 Family history of asthma and other chronic lower respiratory diseases: Secondary | ICD-10-CM | POA: Diagnosis not present

## 2018-11-14 DIAGNOSIS — H70001 Acute mastoiditis without complications, right ear: Secondary | ICD-10-CM | POA: Diagnosis not present

## 2018-11-14 DIAGNOSIS — H9201 Otalgia, right ear: Secondary | ICD-10-CM | POA: Diagnosis present

## 2018-11-14 DIAGNOSIS — H60391 Other infective otitis externa, right ear: Secondary | ICD-10-CM | POA: Diagnosis not present

## 2018-11-14 DIAGNOSIS — A419 Sepsis, unspecified organism: Secondary | ICD-10-CM | POA: Diagnosis not present

## 2018-11-14 DIAGNOSIS — Z6841 Body Mass Index (BMI) 40.0 and over, adult: Secondary | ICD-10-CM | POA: Diagnosis not present

## 2018-11-14 DIAGNOSIS — Z810 Family history of intellectual disabilities: Secondary | ICD-10-CM | POA: Diagnosis not present

## 2018-11-14 DIAGNOSIS — G43809 Other migraine, not intractable, without status migrainosus: Secondary | ICD-10-CM | POA: Diagnosis present

## 2018-11-14 DIAGNOSIS — Z8349 Family history of other endocrine, nutritional and metabolic diseases: Secondary | ICD-10-CM | POA: Diagnosis not present

## 2018-11-14 DIAGNOSIS — H7091 Unspecified mastoiditis, right ear: Secondary | ICD-10-CM | POA: Diagnosis not present

## 2018-11-14 DIAGNOSIS — Z87891 Personal history of nicotine dependence: Secondary | ICD-10-CM | POA: Diagnosis not present

## 2018-11-14 LAB — COMPREHENSIVE METABOLIC PANEL
ALT: 35 U/L (ref 0–44)
AST: 20 U/L (ref 15–41)
Albumin: 3.5 g/dL (ref 3.5–5.0)
Alkaline Phosphatase: 58 U/L (ref 38–126)
Anion gap: 8 (ref 5–15)
BUN: 8 mg/dL (ref 6–20)
CO2: 23 mmol/L (ref 22–32)
Calcium: 8.9 mg/dL (ref 8.9–10.3)
Chloride: 107 mmol/L (ref 98–111)
Creatinine, Ser: 0.77 mg/dL (ref 0.44–1.00)
GFR calc Af Amer: >60 mL/min (ref >60)
GFR calc non Af Amer: >60 mL/min (ref >60)
Glucose, Bld: 97 mg/dL (ref 70–99)
Potassium: 3.7 mmol/L (ref 3.5–5.1)
Sodium: 138 mmol/L (ref 135–145)
Total Bilirubin: 0.9 mg/dL (ref 0.3–1.2)
Total Protein: 6.5 g/dL (ref 6.5–8.1)

## 2018-11-14 LAB — PREGNANCY, URINE: Preg Test, Ur: NEGATIVE

## 2018-11-14 LAB — HIV ANTIBODY (ROUTINE TESTING W REFLEX): HIV Screen 4th Generation wRfx: NONREACTIVE

## 2018-11-14 MED ORDER — CIPROFLOXACIN IN D5W 400 MG/200ML IV SOLN
400.0000 mg | Freq: Two times a day (BID) | INTRAVENOUS | Status: DC
  Administered 2018-11-14 – 2018-11-15 (×3): 400 mg via INTRAVENOUS
  Filled 2018-11-14 (×3): qty 200

## 2018-11-14 MED ORDER — ENOXAPARIN SODIUM 80 MG/0.8ML ~~LOC~~ SOLN
65.0000 mg | SUBCUTANEOUS | Status: DC
  Administered 2018-11-14: 65 mg via SUBCUTANEOUS
  Filled 2018-11-14: qty 0.8

## 2018-11-14 MED ORDER — CIPROFLOXACIN IN D5W 400 MG/200ML IV SOLN: 400.0000 mg | Freq: Two times a day (BID) | INTRAVENOUS | Status: DC

## 2018-11-14 NOTE — Progress Notes (Addendum)
Progress Note    [name] [name]  [id number] DOB: Apr 02, 1996  DOA: 11/13/2018 PCP: Orma Flaming, MD    Brief Narrative:   Chief complaint: Right ear pain  Medical records reviewed and are as summarized below:  [name] [name] is an 23 y.o. female with past medical history significant for anemia and morbid obesity; who presents with complaints of right ear pain, swelling, fever, and chills failing outpatient antibiotics of Augmentin.  Diagnosed with otitis externa and mastoiditis.  Assessment/Plan:   Principal Problem:   Sepsis ([place]) Active Problems:   Morbid obesity ([facility])   Otitis externa   Mastoiditis  Sepsis secondary to otitis externa and mastoiditis: Acute.  Patient with worsening symptoms despite 3 days of Augmentin.  Patient met sepsis criteria given leukocytosis, tachycardia, and reports of fever up to 102 F  at urgent care.  No lactic acid or blood cultures initially obtained.  CT scan reveals right ear mastoiditis and otitis externa.  Patient had been given ciprofloxacin p.o., IV Rocephin, and vancomycin.  Vancomycin was stopped and subsequently discontinued as patient developed itching.  Given Benadryl with resolution of symptoms. ENT was consulted Dr. Redmond Baseman recommending ear wick with removal in his office on Monday 4/13, ciprofloxacin IV, and otic drops.  Clinically appears to be improving 4/10.   -Continue IV ciprofloxacin every 12 hours and otic drops -Plan for changing to p.o. if patient has clinical improvement in next 24 hours -Monitor for improvement in symptoms -Continue hydrocodone p.o. and morphine as needed for moderate to severe pain subjectively  Morbid obesity: BMI noted to be 44.79 kg/m.  -Will continue discussions on need of weight loss   Body mass index is 44.79 kg/m.   Family Communication/Anticipated D/C date and plan/Code Status   DVT prophylaxis: Lovenox per pharmacy Code Status: Full Code.  Family Communication: No family present at  bedside Disposition Plan: Possible discharge home in a.m.   Medical Consultants:    ENT Dr. Redmond Baseman   Anti-Infectives:    Ciprofloxacin, day 2  Subjective:   Patient reports that she still having sharp pains out of the right ear, but pain medications help approximately 3 hours or so.  She feels that symptoms have started in the left ear as well.  Objective:    Vitals:   11/13/18 2034 11/13/18 2137 11/13/18 2227 11/14/18 0423  BP: 100/80 (!) 142/77  (!) 97/57  Pulse:  (!) 115 (!) 104 83  Resp:  20  16  Temp:  98.9 F (37.2 C)  98.2 F (36.8 C)  TempSrc:  Oral  Oral  SpO2: 100% 100%  99%  Weight:      Height:        Intake/Output Summary (Last 24 hours) at 11/14/2018 0914 Last data filed at 11/14/2018 0400 Gross per 24 hour  Intake 0 ml  Output -  Net 0 ml   Filed Weights   11/13/18 1535  Weight: 129.7 kg    Exam: Constitutional: Morbidly obese female in NAD, calm, comfortable sitting on the chair looking out the window Eyes: PERRL, lids and conjunctivae normal ENMT: Erythema noted at the right ear with swelling present unable to evaluate internal canal due to earwick Neck: normal, supple, no masses, no thyromegaly Respiratory: clear to auscultation bilaterally, no wheezing, no crackles. Normal respiratory effort. No accessory muscle use.  Cardiovascular: Regular rate and rhythm, no murmurs / rubs / gallops. Abdomen: no tenderness,  Bowel sounds positive.  Musculoskeletal: no clubbing / cyanosis. No joint deformity  upper and lower extremities. Good ROM, no contractures. Normal muscle tone.  Skin: no rashes, lesions, ulcers. No induration Neurologic: CN 2-12 grossly intact. Sensation intact, DTR normal. Strength 5/5 in all 4.  Psychiatric: Normal judgment and insight. Alert and oriented x 3. Normal mood.    Data Reviewed:   I have personally reviewed following labs and imaging studies:  Labs: Labs show the following:   Basic Metabolic Panel: Recent Labs   Lab 11/13/18 1635 11/14/18 0252  NA 139 138  K 3.9 3.7  CL 105 107  CO2 23 23  GLUCOSE 102* 97  BUN 9 8  CREATININE 0.84 0.77  CALCIUM 9.6 8.9   GFR Estimated Creatinine Clearance: 150.7 mL/min (by C-G formula based on SCr of 0.77 mg/dL). Liver Function Tests: Recent Labs  Lab 11/14/18 0252  AST 20  ALT 35  ALKPHOS 58  BILITOT 0.9  PROT 6.5  ALBUMIN 3.5   No results for input(s): LIPASE, AMYLASE in the last 168 hours. No results for input(s): AMMONIA in the last 168 hours. Coagulation profile No results for input(s): INR, PROTIME in the last 168 hours.  CBC: Recent Labs  Lab 11/13/18 1556  WBC 10.9*  NEUTROABS 8.2*  HGB 12.3  HCT 38.9  MCV 89.0  PLT 313   Cardiac Enzymes: No results for input(s): CKTOTAL, CKMB, CKMBINDEX, TROPONINI in the last 168 hours. BNP (last 3 results) No results for input(s): PROBNP in the last 8760 hours. CBG: No results for input(s): GLUCAP in the last 168 hours. D-Dimer: No results for input(s): DDIMER in the last 72 hours. Hgb A1c: No results for input(s): HGBA1C in the last 72 hours. Lipid Profile: No results for input(s): CHOL, HDL, LDLCALC, TRIG, CHOLHDL, LDLDIRECT in the last 72 hours. Thyroid function studies: No results for input(s): TSH, T4TOTAL, T3FREE, THYROIDAB in the last 72 hours.  Invalid input(s): FREET3 Anemia work up: No results for input(s): VITAMINB12, FOLATE, FERRITIN, TIBC, IRON, RETICCTPCT in the last 72 hours. Sepsis Labs: Recent Labs  Lab 11/13/18 1556  WBC 10.9*    Microbiology No results found for this or any previous visit (from the past 240 hour(s)).  Procedures and diagnostic studies:  Ct Temporal Bones W Contrast  Result Date: 11/13/2018 CLINICAL DATA:  Right ear infection EXAM: CT TEMPORAL BONES WITH CONTRAST TECHNIQUE: Axial and coronal plane CT imaging of the petrous temporal bones was performed with thin-collimation image reconstruction after intravenous contrast administration.  Multiplanar CT image reconstructions were also generated. CONTRAST:  82m OMNIPAQUE IOHEXOL 300 MG/ML  SOLN COMPARISON:  None. FINDINGS: RIGHT: --Pinna and external auditory canal: There is moderate soft tissue thickening within the external auditory canal with mild surrounding inflammatory stranding. --Ossicular chain: There is low attenuation material surrounding the ossicles. --Tympanic membrane: Poorly visualized. --Middle ear: There is near complete opacification of the right middle ear. --Epitympanum: Epitympanum is partially opacified. Scutum remain sharp. --Cochlea, vestibule, vestibular aqueduct and semicircular canals: Normal. No evidence of canal dehiscence or otospongiosis. --Internal auditory canal: Normal. No widening of the porus acusticus. --Facial nerve: No focal abnormality along the course of the facial nerve. --Cerebellopontine angle: Normal. --Petrous Apex: Normal. --Mastoids: Partial opacification of the mastoid air cells --Carotid canal: Normal position. LEFT: --Pinna and external auditory canal: Mild soft tissue thickening of the external auditory canal. --Ossicular chain: Normal. No erosion or dislocation. --Tympanic membrane: Normal. --Middle ear: Normal. --Epitympanum: The Prussak space is clear. The scutum is sharp. Tegmen tympani is intact. --Cochlea, vestibule, vestibular aqueduct and semicircular canals: Normal.  No evidence of canal dehiscence or otospongiosis. --Internal auditory canal: Normal. No widening of the porus acusticus. --Facial nerve: No focal abnormality along the course of the facial nerve. --Cerebellopontine angle: Normal. --Petrous Apex: Normal. --Mastoids: Normal. --Carotid canal: Normal position. OTHER: --Visualized intracranial: Normal. --Visualized paranasal sinuses: Normal. --Nasopharynx: Clear. --Temporomandibular joints: Normal. --Visualized extracranial soft tissues: Normal. IMPRESSION: 1. Near complete opacification of the right middle ear and partial  opacification of the right mastoid air cells, consistent with otomastoiditis. 2. Right greater than left external auditory canal soft tissue thickening, likely indicating otitis externa. 3. No left middle ear or mastoid opacification. Electronically Signed   By: Ulyses Jarred M.D.   On: 11/13/2018 17:59    Medications:   . ciprofloxacin-dexamethasone  4 drop Both EARS BID   Continuous Infusions: . ciprofloxacin       LOS: 0 days   [name] [name]  [facility]   *Please refer to [email or web address], password [email or web address] to get updated schedule on who will round on this patient, as hospitalists switch teams weekly. If 7PM-7AM, please contact night-coverage at [email or web address], password [email or web address] for any overnight needs.

## 2018-11-14 NOTE — Progress Notes (Addendum)
PHARMACY NOTE:  ANTIMICROBIAL RENAL DOSAGE ADJUSTMENT  Current antimicrobial regimen includes a mismatch between antimicrobial dosage and estimated renal function.  As per policy approved by the Pharmacy & Therapeutics and Medical Executive Committees, the antimicrobial dosage will be adjusted accordingly.  Current antimicrobial dosage:  Cipro 200mg  IV every 12 hours Indication: Otomastoiditis with systemic symptoms  Renal Function:  Estimated Creatinine Clearance: 150.7 mL/min (by C-G formula based on SCr of 0.77 mg/dL). []      On intermittent HD, scheduled: []      On CRRT    Antimicrobial dosage has been changed to:  Cipro 400mg  IV every 12 hours  Additional comments: Consulted for Lovenox DVT prophylaxis dosing - due to BMI >30 and CrCl >30, will dose 0.5mg /kg SQ every 24 hours.   Thank you for allowing pharmacy to be a part of this patient's care.  Brain Hilts, [facility] 11/14/2018 9:25 AM

## 2018-11-15 DIAGNOSIS — H60313 Diffuse otitis externa, bilateral: Secondary | ICD-10-CM

## 2018-11-15 LAB — CBC
HCT: 33.5 % — ABNORMAL LOW (ref 36.0–46.0)
Hemoglobin: 10.5 g/dL — ABNORMAL LOW (ref 12.0–15.0)
MCH: 27.1 pg (ref 26.0–34.0)
MCHC: 31.3 g/dL (ref 30.0–36.0)
MCV: 86.6 fL (ref 80.0–100.0)
Platelets: 291 10*3/uL (ref 150–400)
RBC: 3.87 MIL/uL (ref 3.87–5.11)
RDW: 13.4 % (ref 11.5–15.5)
WBC: 6.2 10*3/uL (ref 4.0–10.5)
nRBC: 0 % (ref 0.0–0.2)

## 2018-11-15 MED ORDER — CIPROFLOXACIN-DEXAMETHASONE 0.3-0.1 % OT SUSP: 4.0000 [drp] | Freq: Two times a day (BID) | OTIC | 0 refills | Status: AC

## 2018-11-15 MED ORDER — CIPROFLOXACIN HCL 500 MG PO TABS: 500.0000 mg | ORAL_TABLET | Freq: Two times a day (BID) | ORAL | 0 refills | Status: DC

## 2018-11-15 MED ORDER — OXYCODONE-ACETAMINOPHEN 5-325 MG PO TABS: 1.0000 | ORAL_TABLET | Freq: Three times a day (TID) | ORAL | 0 refills | Status: DC | PRN

## 2018-11-15 NOTE — Plan of Care (Signed)

## 2018-11-15 NOTE — Progress Notes (Signed)
Discharge paperwork and instructions given to pt. Pt not in distress and tolerated well. 

## 2018-11-15 NOTE — Plan of Care (Signed)

## 2018-11-15 NOTE — Discharge Summary (Signed)
[name] [name], is a 23 y.o. female  DOB Mar 23, 1996  MRN [id number].  Admission date:  11/13/2018  Admitting Physician  Little Ishikawa, MD  Discharge Date:  11/15/2018   Primary MD  Orma Flaming, MD  Recommendations for primary care physician for things to follow:   Ensure removal of ear wicks by ENT   Discharge Diagnosis    Principal Problem:   Sepsis [facility]) Active Problems:   Morbid obesity ([place])   Otitis externa   Mastoiditis      Past Medical History:  Diagnosis Date  . Anemia   . Fibroadenoma of left breast    needs follow up 1/20  . Fibroid   . Headache   . History of chicken pox   . Medical history non-contributory   . Streptococcus B carrier or suspected carrier     Past Surgical History:  Procedure Laterality Date  . NO PAST SURGERIES         HPI  from the history and physical done on the day of admission:    [name] [name] is a 23 y.o. female with no medical history who presents with the right ear pain, fever, chills.  She has been seen previously by PCP for what appears to be otitis externa, having failed 3 days of Augmentin with worsening fever and symptoms.  Evaluated at urgent care with fever as high as 102 subsequently sent to ED for further evaluation and treatment.  Patient does admit to fever and chills as above denies nausea, vomiting, diarrhea, constipation, headache, difficulty swallowing  ED Course: In the ED patient received 1 L normal saline, 1 mg morphine, 75 mcg fentanyl, 1 mg Dilaudid well as vancomycin and ceftriaxone x1 dose each.  Initially tachycardic in the setting of pain but vitals otherwise unremarkable.  Afebrile here although had Tylenol just prior to admission.  After outpatient failure of therapy and consulting ENT who recommended Cipro PO as well as Otic route -  hospitalist was consulted for admission to ongoing uncontrolled pain adding of patient failure of antibiotic treatment   Hospital Course:   1.  Sepsis secondary to otitis externa and mastoiditis: Acute.  Patient with worsening symptoms despite 3 days of Augmentin.  Patient met sepsis criteria given leukocytosis, tachycardia, and reports of fever up to 102 F  at urgent care.  No lactic acid or blood cultures initially obtained.  CT scan reveals right ear mastoiditis and otitis externa.  Patient had been given ciprofloxacin p.o., IV Rocephin, and vancomycin.  Vancomycin was stopped and subsequently discontinued as patient developed itching.  Given Benadryl with resolution of symptoms. ENT was consulted Dr. Redmond Baseman recommending ear wick with removal in his office on Monday 4/13, ciprofloxacin IV, and otic drops.  Clinically appears to be improving 4/10.  -Continue IV ciprofloxacin every 12 hours and otic drops -Plan for changing to p.o. if patient has clinical improvement in next 24 hours -Monitor for improvement in symptoms -Continue hydrocodone p.o. and morphine as needed for moderate to severe pain  subjectively  Morbid obesity: BMI noted to be 44.79 kg/m.  Primary care provider to continue outpatient follow-up in regards to weight loss and/or preventative health screenings.    Follow UP  Follow-up Information    Melida Quitter, MD. Schedule an appointment as soon as possible for a visit on 11/17/2018.   Specialty:  Otolaryngology Why:  for ear wick removal Contact information: [address] [place] [address] [place] [address] [phone]            Consults obtained: Dr. Redmond Baseman -ENT  Discharge Condition: Stable  Diet and Activity recommendation: See Discharge Instructions below   Discharge Instructions    Discharge instructions   Complete by:  As directed    Continue antibiotics as prescribed. Follow-up with Dr. Redmond Baseman of ear, nose, and throat(ENT) on 4/13 to have earwax  removed.  Please call and verify time available to do so letting them know that she was just discharged from the hospital.  Also consider follow-up with your primary Orma Flaming, MD when medically appropriate.  ( we routinely change or add medications that can affect your baseline labs and fluid status, therefore we recommend that you get the mentioned basic workup next visit with your PCP, your PCP may decide not to get them or add new tests based on their clinical decision)  Activity: As tolerated   Disposition: Home  Diet: Heart Healthy  Special Instructions: If you have smoked or chewed Tobacco  in the last 2 yrs please stop smoking, stop any regular Alcohol  and or any Recreational drug use.  On your next visit with your primary care physician please Get Medicines reviewed and adjusted.  Please request your Orma Flaming, MD to go over all Hospital Tests and Procedure/Radiological results at the follow up, please get all Hospital records sent to your Prim MD by signing hospital release before you go home.  If you experience worsening of your admission symptoms, develop shortness of breath, life threatening emergency, suicidal or homicidal thoughts you must seek medical attention immediately by calling 911 or calling your MD immediately  if symptoms less severe.  You Must read complete instructions/literature along with all the possible adverse reactions/side effects for all the Medicines you take and that have been prescribed to you. Take any new Medicines after you have completely understood and accpet all the possible adverse reactions/side effects.   Do not drive, operate heavy machinery, perform activities at heights, swimming or participation in water activities or provide baby sitting services if your were admitted for syncope or siezures until you have seen by Primary MD or a Neurologist and advised to do so again.  Do not drive when taking Pain medications.  Do not take more  than prescribed Pain, Sleep and Anxiety Medications  Wear Seat belts while driving.   Please note  You were cared for by a hospitalist during your hospital stay. If you have any questions about your discharge medications or the care you received while you were in the hospital after you are discharged, you can call the unit and asked to speak with the hospitalist on call if the hospitalist that took care of you is not available. Once you are discharged, your primary care physician will handle any further medical issues. Please note that NO REFILLS for any discharge medications will be authorized once you are discharged, as it is imperative that you return to your primary care physician (or establish a relationship with a primary care physician if you do not  have one) for your aftercare needs so that they can reassess your need for medications and monitor your lab values.        Discharge Medications     Allergies as of 11/15/2018   No Known Allergies     Medication List    STOP taking these medications   amoxicillin-clavulanate 875-125 MG tablet Commonly known as:  AUGMENTIN     TAKE these medications   acetaminophen 325 MG tablet Commonly known as:  TYLENOL Take 650 mg by mouth every 6 (six) hours as needed for mild pain or fever.   ciprofloxacin 500 MG tablet Commonly known as:  Cipro Take 1 tablet (500 mg total) by mouth 2 (two) times daily.   ciprofloxacin-dexamethasone OTIC suspension Commonly known as:  Ciprodex Place 4 drops into both ears 2 (two) times daily for 5 days.   ibuprofen 200 MG tablet Commonly known as:  ADVIL,MOTRIN Take 200 mg by mouth every 6 (six) hours as needed for fever or mild pain.   oxyCODONE-acetaminophen 5-325 MG tablet Commonly known as:  PERCOCET/ROXICET Take 1 tablet by mouth every 8 (eight) hours as needed for severe pain.       Major procedures and Radiology Reports - PLEASE review detailed and final reports for all details, in brief  -     Ct Temporal Bones W Contrast  Result Date: 11/13/2018 CLINICAL DATA:  Right ear infection EXAM: CT TEMPORAL BONES WITH CONTRAST TECHNIQUE: Axial and coronal plane CT imaging of the petrous temporal bones was performed with thin-collimation image reconstruction after intravenous contrast administration. Multiplanar CT image reconstructions were also generated. CONTRAST:  23m OMNIPAQUE IOHEXOL 300 MG/ML  SOLN COMPARISON:  None. FINDINGS: RIGHT: --Pinna and external auditory canal: There is moderate soft tissue thickening within the external auditory canal with mild surrounding inflammatory stranding. --Ossicular chain: There is low attenuation material surrounding the ossicles. --Tympanic membrane: Poorly visualized. --Middle ear: There is near complete opacification of the right middle ear. --Epitympanum: Epitympanum is partially opacified. Scutum remain sharp. --Cochlea, vestibule, vestibular aqueduct and semicircular canals: Normal. No evidence of canal dehiscence or otospongiosis. --Internal auditory canal: Normal. No widening of the porus acusticus. --Facial nerve: No focal abnormality along the course of the facial nerve. --Cerebellopontine angle: Normal. --Petrous Apex: Normal. --Mastoids: Partial opacification of the mastoid air cells --Carotid canal: Normal position. LEFT: --Pinna and external auditory canal: Mild soft tissue thickening of the external auditory canal. --Ossicular chain: Normal. No erosion or dislocation. --Tympanic membrane: Normal. --Middle ear: Normal. --Epitympanum: The Prussak space is clear. The scutum is sharp. Tegmen tympani is intact. --Cochlea, vestibule, vestibular aqueduct and semicircular canals: Normal. No evidence of canal dehiscence or otospongiosis. --Internal auditory canal: Normal. No widening of the porus acusticus. --Facial nerve: No focal abnormality along the course of the facial nerve. --Cerebellopontine angle: Normal. --Petrous Apex: Normal. --Mastoids:  Normal. --Carotid canal: Normal position. OTHER: --Visualized intracranial: Normal. --Visualized paranasal sinuses: Normal. --Nasopharynx: Clear. --Temporomandibular joints: Normal. --Visualized extracranial soft tissues: Normal. IMPRESSION: 1. Near complete opacification of the right middle ear and partial opacification of the right mastoid air cells, consistent with otomastoiditis. 2. Right greater than left external auditory canal soft tissue thickening, likely indicating otitis externa. 3. No left middle ear or mastoid opacification. Electronically Signed   By: KUlyses JarredM.D.   On: 11/13/2018 17:59    Micro Results    No results found for this or any previous visit (from the past 240 hour(s)).     Today   Subjective  [name] [name] today states that she still having some pain in her left ear.  Denies having any fever overnight.  Does feel like symptoms are somewhat better.  Objective   Blood pressure 126/75, pulse 69, temperature 98.2 F (36.8 C), temperature source Oral, resp. rate 16, height 5' 7"  (1.702 m), weight 129.7 kg, last menstrual period 10/21/2018, SpO2 98 %, not currently breastfeeding.   Intake/Output Summary (Last 24 hours) at 11/15/2018 1000 Last data filed at 11/14/2018 1049 Gross per 24 hour  Intake 200 ml  Output -  Net 200 ml    Exam  Constitutional: NAD, calm, comfortable Eyes: PERRL, lids and conjunctivae normal ENMT: Gauged earlobes.  Erythema still present most notably still on the right earlobe.  Earwax present bilaterally.  Mild tenderness to palpation Neck: normal, supple, no masses, no thyromegaly Respiratory: clear to auscultation bilaterally, no wheezing, no crackles. Normal respiratory effort. No accessory muscle use.  Cardiovascular: Regular rate and rhythm, no murmurs / rubs / gallops. No extremity edema. 2+ pedal pulses. No carotid bruits.  Abdomen: no tenderness, no masses palpated. No hepatosplenomegaly. Bowel sounds positive.   Musculoskeletal: no clubbing / cyanosis. No joint deformity upper and lower extremities. Good ROM, no contractures. Normal muscle tone.  Skin: Erythema most notably the right earlobe still present. Neurologic: CN 2-12 grossly intact. Sensation intact, DTR normal. Strength 5/5 in all 4.  Psychiatric: Normal judgment and insight. Alert and oriented x 3. Normal mood.    Data Review   CBC w Diff:  Lab Results  Component Value Date   WBC 6.2 11/15/2018   HGB 10.5 (L) 11/15/2018   HCT 33.5 (L) 11/15/2018   PLT 291 11/15/2018   LYMPHOPCT 16 11/13/2018   MONOPCT 7 11/13/2018   EOSPCT 1 11/13/2018   BASOPCT 1 11/13/2018    CMP:  Lab Results  Component Value Date   NA 138 11/14/2018   K 3.7 11/14/2018   CL 107 11/14/2018   CO2 23 11/14/2018   BUN 8 11/14/2018   CREATININE 0.77 11/14/2018   PROT 6.5 11/14/2018   ALBUMIN 3.5 11/14/2018   BILITOT 0.9 11/14/2018   ALKPHOS 58 11/14/2018   AST 20 11/14/2018   ALT 35 11/14/2018  .   Total Time in preparing paper work, data evaluation and todays exam - 35 minutes  Norval Morton M.D on 11/15/2018 at 10:00 AM  [facility]   Office  [phone]

## 2018-11-17 DIAGNOSIS — H60333 Swimmer's ear, bilateral: Secondary | ICD-10-CM | POA: Diagnosis not present

## 2018-11-17 DIAGNOSIS — H6123 Impacted cerumen, bilateral: Secondary | ICD-10-CM | POA: Diagnosis not present

## 2018-12-30 ENCOUNTER — Ambulatory Visit: Payer: Self-pay | Admitting: *Deleted

## 2018-12-30 NOTE — Telephone Encounter (Signed)
See note

## 2018-12-30 NOTE — Telephone Encounter (Signed)
Pt called stating that last night she had loose stool with 1 episode last night; today she had 3 episodes within 15 min of bloody diarrhea, and blood on the surface of mucus stool; recommendations made per nurse triage protocol; pt transferred to [facility], [place] for scheduling.  Reason for Disposition . MODERATE rectal bleeding (small blood clots, passing blood without stool, or toilet water turns red)  Answer Assessment - Initial Assessment Questions 1. APPEARANCE of BLOOD: "What color is it?" "Is it passed separately, on the surface of the stool, or mixed in with the stool?"      Bright red; in toliet and on surface of stool 2. AMOUNT: "How much blood was passed?"     Unable to quantify 3. FREQUENCY: "How many times has blood been passed with the stools?"      3 4. ONSET: "When was the blood first seen in the stools?" (Days or weeks)      12/30/2018 at 0700 5. DIARRHEA: "Is there also some diarrhea?" If so, ask: "How many diarrhea stools were passed in past 24 hours?"     Yes; 5  6. CONSTIPATION: "Do you have constipation?" If so, "How bad is it?"     no 7. RECURRENT SYMPTOMS: "Have you had blood in your stools before?" If so, ask: "When was the last time?" and "What happened that time?"      no 8. BLOOD THINNERS: "Do you take any blood thinners?" (e.g., Coumadin/warfarin, Pradaxa/dabigatran, aspirin)     no 9. OTHER SYMPTOMS: "Do you have any other symptoms?"  (e.g., abdominal pain, vomiting, dizziness, fever)     Abdominal cramping 10. PREGNANCY: "Is there any chance you are pregnant?" "When was your last menstrual period?"       No LMP 12/09/2018  Protocols used: RECTAL BLEEDING-A-AH

## 2018-12-31 ENCOUNTER — Other Ambulatory Visit: Payer: Self-pay

## 2018-12-31 ENCOUNTER — Encounter: Payer: Self-pay | Admitting: Family Medicine

## 2018-12-31 ENCOUNTER — Ambulatory Visit: Payer: 59 | Admitting: Family Medicine

## 2018-12-31 VITALS — BP 134/90 | HR 81 | Temp 98.2°F | Ht 67.0 in | Wt 268.2 lb

## 2018-12-31 DIAGNOSIS — K921 Melena: Secondary | ICD-10-CM

## 2018-12-31 DIAGNOSIS — R109 Unspecified abdominal pain: Secondary | ICD-10-CM | POA: Diagnosis not present

## 2018-12-31 LAB — COMPREHENSIVE METABOLIC PANEL
ALT: 24 U/L (ref 0–35)
AST: 13 U/L (ref 0–37)
Albumin: 4.3 g/dL (ref 3.5–5.2)
Alkaline Phosphatase: 62 U/L (ref 39–117)
BUN: 11 mg/dL (ref 6–23)
CO2: 26 mEq/L (ref 19–32)
Calcium: 9.6 mg/dL (ref 8.4–10.5)
Chloride: 105 mEq/L (ref 96–112)
Creatinine, Ser: 0.7 mg/dL (ref 0.40–1.20)
GFR: 101.69 mL/min (ref >60.00)
Glucose, Bld: 86 mg/dL (ref 70–99)
Potassium: 4.2 mEq/L (ref 3.5–5.1)
Sodium: 138 mEq/L (ref 135–145)
Total Bilirubin: 0.4 mg/dL (ref 0.2–1.2)
Total Protein: 6.9 g/dL (ref 6.0–8.3)

## 2018-12-31 LAB — CBC WITH DIFFERENTIAL/PLATELET
Basophils Absolute: 0 10*3/uL (ref 0.0–0.1)
Basophils Relative: 0.6 % (ref 0.0–3.0)
Eosinophils Absolute: 0.1 10*3/uL (ref 0.0–0.7)
Eosinophils Relative: 1.4 % (ref 0.0–5.0)
HCT: 36.5 % (ref 36.0–46.0)
Hemoglobin: 12.3 g/dL (ref 12.0–15.0)
Lymphocytes Relative: 28.5 % (ref 12.0–46.0)
Lymphs Abs: 2.1 10*3/uL (ref 0.7–4.0)
MCHC: 33.7 g/dL (ref 30.0–36.0)
MCV: 84.5 fl (ref 78.0–100.0)
Monocytes Absolute: 0.4 10*3/uL (ref 0.1–1.0)
Monocytes Relative: 5.8 % (ref 3.0–12.0)
Neutro Abs: 4.7 10*3/uL (ref 1.4–7.7)
Neutrophils Relative %: 63.7 % (ref 43.0–77.0)
Platelets: 278 10*3/uL (ref 150.0–400.0)
RBC: 4.32 Mil/uL (ref 3.87–5.11)
RDW: 14.2 % (ref 11.5–15.5)
WBC: 7.5 10*3/uL (ref 4.0–10.5)

## 2018-12-31 LAB — SEDIMENTATION RATE: Sed Rate: 23 mm/hr — ABNORMAL HIGH (ref 0–20)

## 2018-12-31 LAB — TSH: TSH: 19.99 u[IU]/mL — ABNORMAL HIGH (ref 0.35–4.50)

## 2018-12-31 LAB — C-REACTIVE PROTEIN: CRP: 1.2 mg/dL (ref 0.5–20.0)

## 2018-12-31 NOTE — Progress Notes (Signed)
Patient: [name] [name] MRN: [id number] DOB: [birth year] PCP: Orma Flaming, MD     Subjective:  Chief Complaint  Patient presents with  . Rectal Bleeding    x 2 days    HPI: The patient is a 23 y.o. female who presents today for rectal bleeding for 2 days. She has never had an issue before. She had the urge to go to the bathroom and didn't have a BM, it was just mucous with stringy blood in it. She had some cramping as well. She continued to have urge to have BM and it would continue to be mucous/stringy material with blood with the cramping. She had real Bm this AM and stool was normal/soft, but had blood on the outside of it. Blood appears to be red/peachy/brown in color. No frank blood in toilet. Stomach is no longer cramping today. She has had a few burning sensations in her stomach area throughout the day. She did feel a wet sensation last night and had clear leakage from her bottom. NO blood in this. No family hx of IBD/colon cancer. No celiacs disease. Denies any joint pain/rashes. Was on vancomycin in April for mastoiditis, but this was stopped. Put on IV cipro. Also had oral cipro and some augmentin. No fever/chills, joint pain, rashes or other systemic complaints.   Review of Systems  Constitutional: Negative for fatigue.  Respiratory: Negative for shortness of breath.   Cardiovascular: Negative for chest pain.  Gastrointestinal: Positive for abdominal pain, blood in stool and diarrhea. Negative for nausea and vomiting.  Neurological: Negative for dizziness and headaches.  Psychiatric/Behavioral: Positive for sleep disturbance.    Allergies Patient is allergic to vancomycin.  Past Medical History Patient  has a past medical history of Anemia, Fibroadenoma of left breast, Fibroid, Headache, History of chicken pox, Medical history non-contributory, and Streptococcus B carrier or suspected carrier.  Surgical History Patient  has a past surgical history that includes No past  surgeries.  Family History Pateint's family history includes COPD in her maternal grandmother; Healthy in her mother; Hyperlipidemia in her father; Hypertension in her father; Mental retardation in her brother.  Social History Patient  reports that she quit smoking about 8 years ago. She has never used smokeless tobacco. She reports that she does not drink alcohol or use drugs.    Objective: Vitals:   12/31/18 0859  BP: 134/90  Pulse: 81  Temp: 98.2 F (36.8 C)  TempSrc: Oral  SpO2: 99%  Weight: 268 lb 3.2 oz (121.7 kg)  Height: 5\' 7"  (1.702 m)    Body mass index is 42.01 kg/m.  Physical Exam Vitals signs reviewed. Exam conducted with a chaperone present.  Constitutional:      Appearance: She is obese.  Cardiovascular:     Rate and Rhythm: Normal rate and regular rhythm.     Heart sounds: Normal heart sounds.  Pulmonary:     Effort: Pulmonary effort is normal.     Breath sounds: Normal breath sounds.  Abdominal:     General: Abdomen is flat. Bowel sounds are normal.     Palpations: Abdomen is soft.     Tenderness: There is no abdominal tenderness.  Genitourinary:    Rectum: Normal.     Comments: No internal hemorrhoids appreciated. Small skin tag on anus at 7:00. No external hemorrhoid or other abnormality.  Neurological:     Mental Status: She is alert.   pictures reviewed that patient has on her phone.     Assessment/plan: 1. Melena  Differential includes viral illness vs. Antibiotic induced c.diff, IBD, absorption issue. Do not appreciate hemorrhoids, but could also be on differential. Will check labs/stool studies and if normal will send to GI. Discussed plan with her and precautions given.  - Clostridium difficile Toxin B, Qualitative, Real-Time PCR - Fecal lactoferrin, quant; Future - Fecal occult blood, imunochemical; Future - Stool culture; Future - Ova and parasite examination; Future - Gastrointestinal Pathogen Panel PCR; Future - CBC with  Differential/Platelet - Comprehensive metabolic panel - Sedimentation rate - C-reactive protein - TSH  2. Stomach pain See above.   Return if symptoms worsen or fail to improve.   Orma Flaming, MD [place]   12/31/2018

## 2018-12-31 NOTE — Patient Instructions (Signed)
We are going to do stool studies to make sure no infection and labs.    If all normal then sending to GI.

## 2019-01-01 ENCOUNTER — Other Ambulatory Visit: Payer: 59

## 2019-01-01 ENCOUNTER — Encounter: Payer: Self-pay | Admitting: Family Medicine

## 2019-01-01 ENCOUNTER — Other Ambulatory Visit: Payer: Self-pay | Admitting: Family Medicine

## 2019-01-01 DIAGNOSIS — E039 Hypothyroidism, unspecified: Secondary | ICD-10-CM | POA: Insufficient documentation

## 2019-01-01 MED ORDER — LEVOTHYROXINE SODIUM 25 MCG PO TABS: 25.0000 ug | ORAL_TABLET | Freq: Every day | ORAL | 0 refills | Status: DC

## 2019-01-01 NOTE — Addendum Note (Signed)
Addended by: Tomi Likens on: 01/01/2019 08:11 AM   Modules accepted: Orders

## 2019-01-02 ENCOUNTER — Encounter: Payer: Self-pay | Admitting: Family Medicine

## 2019-01-02 ENCOUNTER — Other Ambulatory Visit: Payer: Self-pay | Admitting: Family Medicine

## 2019-01-02 MED ORDER — HYDROCORTISONE ACETATE 25 MG RE SUPP: 25.0000 mg | Freq: Two times a day (BID) | RECTAL | 0 refills | Status: DC

## 2019-01-02 NOTE — Addendum Note (Signed)
Addended by: Tomi Likens on: 01/02/2019 10:13 AM   Modules accepted: Orders

## 2019-01-05 ENCOUNTER — Encounter: Payer: Self-pay | Admitting: Family Medicine

## 2019-01-06 ENCOUNTER — Ambulatory Visit: Payer: Self-pay

## 2019-01-06 NOTE — Telephone Encounter (Signed)
Incoming call from Patient who reports at having sore throat.  And acid reflux.  Onset 4:00 this am .  Pt reports sore throat also.  thinks the chest Pain was acid reflux.  Pt states that has resolved.  Encouraged pt. To call back if Sx.  Worsen.      Reason for Disposition . [1] Sore throat with cough/cold symptoms AND [2] present > 5 days  Answer Assessment - Initial Assessment Questions 1. MECHANISM: "How did the injury happen?"     Chest Pain 2. ONSET: "When did the injury happen?" (Minutes or hours ago)     4am 3. LOCATION: "Where on the chest is the injury located?"     Middle of chest 4. APPEARANCE: "What does the injury look like?"     na 5. BLEEDING: "Is there any bleeding now? If so, ask: How long has it been bleeding?"     na 6. SEVERITY: "Any difficulty with breathing?"    Breaths were deeper 7. SIZE: For cuts, bruises, or swelling, ask: "How large is it?" (e.g., inches or centimeters)      8. PAIN: "Is there pain?" If so, ask: "How bad is the pain?"   (e.g., Scale 1-10; or mild, moderate, severe)     discomfort 9. TETANUS: For any breaks in the skin, ask: "When was the last tetanus booster?"     na 10. PREGNANCY: "Is there any chance you are pregnant?" "When was your last menstrual period?"       Early may  Answer Assessment - Initial Assessment Questions 1. ONSET: "When did the throat start hurting?" (Hours or days ago)      yesterday 2. SEVERITY: "How bad is the sore throat?" (Scale 1-10; mild, moderate or severe)   - MILD (1-3):  doesn't interfere with eating or normal activities   - MODERATE (4-7): interferes with eating some solids and normal activities   - SEVERE (8-10):  excruciating pain, interferes with most normal activities   - SEVERE DYSPHAGIA: can't swallow liquids, drooling     mild 3. STREP EXPOSURE: "Has there been any exposure to strep within the past week?" If so, ask: "What type of contact occurred?"      denies 4.  VIRAL SYMPTOMS: "Are there  any symptoms of a cold, such as a runny nose, cough, hoarse voice or red eyes?"      Little of runny nose 5. FEVER: "Do you have a fever?" If so, ask: "What is your temperature, how was it measured, and when did it start?"     Had a fever gone 6. PUS ON THE TONSILS: "Is there pus on the tonsils in the back of your throat?"     denies 7. OTHER SYMPTOMS: "Do you have any other symptoms?" (e.g., difficulty breathing, headache, rash)     Slight headache 8. PREGNANCY: "Is there any chance you are pregnant?" "When was your last menstrual period?"     earlyy  May  Protocols used: [place], [place]

## 2019-01-07 ENCOUNTER — Ambulatory Visit (HOSPITAL_COMMUNITY)
Admission: EM | Admit: 2019-01-07 | Discharge: 2019-01-07 | Disposition: A | Discharge status: Home / Self Care | Payer: 59 | Attending: Family Medicine | Admitting: Family Medicine

## 2019-01-07 ENCOUNTER — Other Ambulatory Visit: Payer: Self-pay

## 2019-01-07 ENCOUNTER — Encounter (HOSPITAL_COMMUNITY): Payer: Self-pay | Admitting: Emergency Medicine

## 2019-01-07 DIAGNOSIS — J020 Streptococcal pharyngitis: Secondary | ICD-10-CM | POA: Diagnosis not present

## 2019-01-07 HISTORY — DX: Disorder of thyroid, unspecified: E07.9

## 2019-01-07 LAB — GASTROINTESTINAL PATHOGEN PANEL PCR
C. difficile Tox A/B, PCR: NOT DETECTED
Campylobacter, PCR: NOT DETECTED
Cryptosporidium, PCR: NOT DETECTED
E coli (ETEC) LT/ST PCR: NOT DETECTED
E coli (STEC) stx1/stx2, PCR: NOT DETECTED
E coli 0157, PCR: NOT DETECTED
Giardia lamblia, PCR: NOT DETECTED
Norovirus, PCR: NOT DETECTED
Rotavirus A, PCR: NOT DETECTED
Salmonella, PCR: NOT DETECTED
Shigella, PCR: NOT DETECTED

## 2019-01-07 LAB — STOOL CULTURE
MICRO NUMBER:: 514578
MICRO NUMBER:: 514579
MICRO NUMBER:: 514581
SHIGA RESULT:: NOT DETECTED
SPECIMEN QUALITY:: ADEQUATE
SPECIMEN QUALITY:: ADEQUATE
SPECIMEN QUALITY:: ADEQUATE

## 2019-01-07 LAB — FECAL LACTOFERRIN, QUANT
Fecal Lactoferrin: NEGATIVE
MICRO NUMBER:: 514621
SPECIMEN QUALITY:: ADEQUATE

## 2019-01-07 LAB — OVA AND PARASITE EXAMINATION
CONCENTRATE RESULT:: NONE SEEN
MICRO NUMBER:: 514580
SPECIMEN QUALITY:: ADEQUATE
TRICHROME RESULT:: NONE SEEN

## 2019-01-07 LAB — POCT RAPID STREP A: Streptococcus, Group A Screen (Direct): POSITIVE — AB

## 2019-01-07 MED ORDER — PENICILLIN G BENZATHINE 1200000 UNIT/2ML IM SUSP
INTRAMUSCULAR | Status: AC
  Filled 2019-01-07: qty 2

## 2019-01-07 MED ORDER — PENICILLIN G BENZATHINE 1200000 UNIT/2ML IM SUSP
1.2000 10*6.[IU] | Freq: Once | INTRAMUSCULAR | Status: AC
  Administered 2019-01-07: 1.2 10*6.[IU] via INTRAMUSCULAR

## 2019-01-07 NOTE — Discharge Instructions (Addendum)
Your rapid strep test was positive Treating here in clinic with penicillin injection. You can take Tylenol or ibuprofen for pain Warm salt water gargles or warm tea could help Follow up as needed for continued or worsening symptoms

## 2019-01-07 NOTE — ED Provider Notes (Signed)
[facility]    CSN: [id number] Arrival date & time: 01/07/19  1232     History   Chief Complaint Chief Complaint  Patient presents with  . Cough  . Sore Throat    HPI [name] [name] is a 23 y.o. female.   Patient is a 23 year old female presents today with sore throat, mild cough and rhinorrhea.  This has been present and remained the same since 3 days ago.  She has not taken anything for her symptoms.  She came in today with concerns due to the increase sore throat and possible strep.  Denies any fever.  Denies any recent sick contacts or recent COVID exposures.  Reporting she has had tonsillitis a few times in the past.   ROS per HPI      Past Medical History:  Diagnosis Date  . Anemia   . Fibroadenoma of left breast    needs follow up 1/20  . Fibroid   . Headache   . History of chicken pox   . Medical history non-contributory   . Streptococcus B carrier or suspected carrier   . Thyroid disease     Patient Active Problem List   Diagnosis Date Noted  . Acquired hypothyroidism 01/01/2019  . Mastoiditis 11/13/2018  . Chronic migraine 12/13/2015  . Low back pain 12/13/2015  . Morbid obesity ([place]) 11/01/2015    Past Surgical History:  Procedure Laterality Date  . NO PAST SURGERIES      OB History    Gravida  2   Para  2   Term  2   Preterm  0   AB  0   Living  2     SAB  0   TAB  0   Ectopic  0   Multiple  0   Live Births  2            Home Medications    Prior to Admission medications   Medication Sig Start Date End Date Taking? Authorizing Provider  acetaminophen (TYLENOL) 325 MG tablet Take 650 mg by mouth every 6 (six) hours as needed for mild pain or fever.   Yes [provider]  levothyroxine (SYNTHROID) 25 MCG tablet Take 1 tablet (25 mcg total) by mouth daily before breakfast. Repeat labs in 6-8 weeks 01/01/19  Yes Orma Flaming, MD  hydrocortisone (ANUSOL-HC) 25 MG suppository Place 1 suppository (25  mg total) rectally 2 (two) times daily. 01/02/19   Orma Flaming, MD    Family History Family History  Problem Relation Age of Onset  . Healthy Mother   . Hypertension Father   . Hyperlipidemia Father   . Mental retardation Brother   . COPD Maternal Grandmother     Social History Social History   Tobacco Use  . Smoking status: Former Smoker    Last attempt to quit: 10/25/2010    Years since quitting: 8.2  . Smokeless tobacco: Never Used  Substance Use Topics  . Alcohol use: No    Comment: socially  . Drug use: No     Allergies   Vancomycin   Review of Systems Review of Systems   Physical Exam Triage Vital Signs ED Triage Vitals  Enc Vitals Group     BP 01/07/19 1306 112/64     Pulse Rate 01/07/19 1306 82     Resp 01/07/19 1306 18     Temp 01/07/19 1306 98.2 F (36.8 C)     Temp Source 01/07/19 1306 Oral  SpO2 01/07/19 1306 100 %     Weight --      Height --      Head Circumference --      Peak Flow --      Pain Score 01/07/19 1302 2     Pain Loc --      Pain Edu? --      Excl. in [place]? --    No data found.  Updated Vital Signs BP 112/64 (BP Location: Left Arm) Comment (BP Location): large cuff  Pulse 82   Temp 98.2 F (36.8 C) (Oral)   Resp 18   LMP 12/08/2018 (Within Days)   SpO2 100%   Visual Acuity Right Eye Distance:   Left Eye Distance:   Bilateral Distance:    Right Eye Near:   Left Eye Near:    Bilateral Near:     Physical Exam Vitals signs and nursing note reviewed.  Constitutional:      General: She is not in acute distress.    Appearance: She is well-developed. She is not ill-appearing, toxic-appearing or diaphoretic.  HENT:     Head: Normocephalic and atraumatic.     Right Ear: Tympanic membrane and ear canal normal.     Left Ear: Tympanic membrane and ear canal normal.     Nose: Congestion present.     Mouth/Throat:     Pharynx: Posterior oropharyngeal erythema present.     Tonsils: No tonsillar exudate or tonsillar  abscesses. 1+ on the right. 1+ on the left.  Neck:     Musculoskeletal: Normal range of motion.     Thyroid: No thyromegaly.  Cardiovascular:     Rate and Rhythm: Normal rate and regular rhythm.  Pulmonary:     Effort: Pulmonary effort is normal.  Lymphadenopathy:     Cervical: No cervical adenopathy.  Skin:    General: Skin is warm and dry.  Neurological:     Mental Status: She is alert.  Psychiatric:        Mood and Affect: Mood normal.      UC Treatments / Results  Labs (all labs ordered are listed, but only abnormal results are displayed) Labs Reviewed  POCT RAPID STREP A - Abnormal; Notable for the following components:      Result Value   Streptococcus, Group A Screen (Direct) POSITIVE (*)    All other components within normal limits    EKG None  Radiology No results found.  Procedures Procedures (including critical care time)  Medications Ordered in UC Medications  penicillin g benzathine (BICILLIN LA) 1200000 UNIT/2ML injection 1.2 Million Units (has no administration in time range)    Initial Impression / Assessment and Plan / UC Course  I have reviewed the triage vital signs and the nursing notes.  Pertinent labs & imaging results that were available during my care of the patient were reviewed by me and considered in my medical decision making (see chart for details).    Strep pharyngitis   Rapid strep test positive. Treating with penicillin here in clinic Tylenol ibuprofen as needed for pain Work note given to return on Friday Follow up as needed for continued or worsening symptoms  Final Clinical Impressions(s) / UC Diagnoses   Final diagnoses:  Strep pharyngitis     Discharge Instructions     Your rapid strep test was positive Treating here in clinic with penicillin injection. You can take Tylenol or ibuprofen for pain Warm salt water gargles or warm tea could help Follow  up as needed for continued or worsening symptoms     ED  Prescriptions    None     Controlled Substance Prescriptions [place] Controlled Substance Registry consulted? Not Applicable   Orvan July, NP 01/07/19 1408

## 2019-01-07 NOTE — ED Triage Notes (Signed)
Onset Monday of slight sore throat and cough.  Also has nasal drainage No fever No sob

## 2019-01-13 ENCOUNTER — Encounter: Payer: Self-pay | Admitting: Family Medicine

## 2019-01-13 ENCOUNTER — Other Ambulatory Visit: Payer: Self-pay

## 2019-01-13 DIAGNOSIS — R109 Unspecified abdominal pain: Secondary | ICD-10-CM

## 2019-01-13 DIAGNOSIS — K921 Melena: Secondary | ICD-10-CM

## 2019-01-13 NOTE — Progress Notes (Signed)
Referral placed to GI for consult  Dx: melena, stomach pain

## 2019-01-14 ENCOUNTER — Encounter: Payer: Self-pay | Admitting: Gastroenterology

## 2019-01-30 ENCOUNTER — Telehealth: Payer: Self-pay

## 2019-01-30 NOTE — Telephone Encounter (Signed)
Covid-19 screening questions   Do you now or have you had a fever in the last 14 days no   Do you have any respiratory symptoms of shortness of breath or cough now or in the last 14 days no  Do you have any family members or close contacts with diagnosed or suspected Covid-19 in the past 14 days no  Have you been tested for Covid-19 and found to be positive no          

## 2019-02-02 ENCOUNTER — Ambulatory Visit: Payer: 59 | Admitting: Gastroenterology

## 2019-02-02 ENCOUNTER — Encounter: Payer: Self-pay | Admitting: Gastroenterology

## 2019-02-02 VITALS — BP 122/68 | HR 91 | Ht 67.0 in | Wt 265.6 lb

## 2019-02-02 DIAGNOSIS — K219 Gastro-esophageal reflux disease without esophagitis: Secondary | ICD-10-CM

## 2019-02-02 DIAGNOSIS — K625 Hemorrhage of anus and rectum: Secondary | ICD-10-CM

## 2019-02-02 MED ORDER — OMEPRAZOLE 40 MG PO CPDR: 40.0000 mg | DELAYED_RELEASE_CAPSULE | Freq: Every day | ORAL | 5 refills | Status: DC

## 2019-02-02 NOTE — Progress Notes (Signed)
HPI: This is a very pleasant 23 year old woman who was referred to me by Orma Flaming, MD  to evaluate minor rectal bleeding, swallowing trouble, burning in epigastrium and and throat.    Chief complaint is rectal bleeding, dysphasia, epigastric burning  5 weeks ago she had 2 to 3 days of mucus and minor blood in her stool.  She did not have significant constipation or diarrhea around that time.  She saw her primary care physician who felt that she had a small skin tag.  She had a lot of blood work done and stool testing at that time.  See those below.  For about 3 weeks she has had some burning in her left upper quadrant.  She calls this dull, intermittent, sometimes it happens when she eats sometimes it happens before she eats.  She has also around the same.  Had some throat sensation of really globus.  Is not dysphasia.  She said she just feels a lump in her throat when she drinks water.  Around the same time she started having some burning in her chest, pyrosis.  She has not tried any antiacid medicines.  Overall her weight is relatively stable.    Old Data Reviewed:  Labs May 2020: TSH was very elevated at 20 sed rate slightly elevated, CRP completely normal.  Cbc normal, Complete metabolic profile normal, Stool testing: GI pathogen panel negative, ova parasite exam negative, stool culture, fecal lactoferrin negative  Rapid strep test +3 weeks ago  Abdominal ultrasound October 2019 showed possible fatty liver, otherwise normal.  Right upper quadrant ultrasound essentially the same.   Review of systems: Pertinent positive and negative review of systems were noted in the above HPI section. All other review negative.   Past Medical History:  Diagnosis Date  . Anemia   . Fibroadenoma of left breast    needs follow up 1/20  . Fibroid   . Headache   . History of chicken pox   . Medical history non-contributory   . Streptococcus B carrier or suspected carrier   . Thyroid disease      Past Surgical History:  Procedure Laterality Date  . NO PAST SURGERIES      Current Outpatient Medications  Medication Sig Dispense Refill  . acetaminophen (TYLENOL) 325 MG tablet Take 650 mg by mouth every 6 (six) hours as needed for mild pain or fever.    . levothyroxine (SYNTHROID) 25 MCG tablet Take 1 tablet (25 mcg total) by mouth daily before breakfast. Repeat labs in 6-8 weeks 90 tablet 0   No current facility-administered medications for this visit.     Allergies as of 02/02/2019 - Review Complete 02/02/2019  Allergen Reaction Noted  . Vancomycin Itching 12/31/2018    Family History  Problem Relation Age of Onset  . Healthy Mother   . Hypertension Father   . Hyperlipidemia Father   . Mental retardation Brother   . COPD Maternal Grandmother   . Breast cancer Maternal Grandmother   . Bladder Cancer Paternal Grandmother     Social History   Socioeconomic History  . Marital status: Single    Spouse name: Not on file  . Number of children: 1  . Years of education: 2 yrs coll  . Highest education level: Not on file  Occupational History  . Occupation: Secondary school teacher  Social Needs  . Financial resource strain: Not hard at all  . Food insecurity    Worry: Never true    Inability: Never true  .  Transportation needs    Medical: No    Non-medical: No  Tobacco Use  . Smoking status: Former Smoker    Quit date: 10/25/2010    Years since quitting: 8.2  . Smokeless tobacco: Never Used  Substance and Sexual Activity  . Alcohol use: No    Comment: socially  . Drug use: No  . Sexual activity: Yes    Birth control/protection: None  Lifestyle  . Physical activity    Days per week: 0 days    Minutes per session: 0 min  . Stress: Not at all  Relationships  . Social Herbalist on phone: Once a week    Gets together: Once a week    Attends religious service: Never    Active member of club or organization: No    Attends meetings of clubs or  organizations: Never    Relationship status: Living with partner  . Intimate partner violence    Fear of current or ex partner: No    Emotionally abused: No    Physically abused: No    Forced sexual activity: No  Other Topics Concern  . Not on file  Social History Narrative   Lives at home with boyfriend and son.   Right-handed.   2-4 cups caffeine daily (unsweet tea).     Physical Exam: BP 122/68   Pulse 91   Ht 5\' 7"  (1.702 m)   Wt 265 lb 9.6 oz (120.5 kg)   BMI 41.60 kg/m  Constitutional: generally well-appearing Psychiatric: alert and oriented x3 Eyes: extraocular movements intact Mouth: oral pharynx moist, no lesions Neck: supple no lymphadenopathy Cardiovascular: heart regular rate and rhythm Lungs: clear to auscultation bilaterally Abdomen: soft, nontender, nondistended, no obvious ascites, no peritoneal signs, normal bowel sounds Extremities: no lower extremity edema bilaterally Skin: no lesions on visible extremities Rectal examination with female assistant in the room: Very small deflated hemorrhoidal type tissue anterior midline.  No anal fissures, distal rectal examination was normal, no masses felt.  Assessment and plan: 23 y.o. female with minor rectal bleeding, GERD-like symptoms  First I do think her minor rectal bleeding was related to a small external hemorrhoid.  She describes a lot of bleeding just 2 to 3 days ago and the swelling that was at her backside completely resolved after that.  Sounds very much like a small external hemorrhoid.  Recommend she try Citrucel fiber supplements to bulk her stool get her moving her bowels very easily once daily without any pushing or straining and easy cleanout.  She will return to see me in 6 or 7 weeks and sooner if any issues.  Her upper GI symptoms seem very GERD-like.  She is quite obese and that could play a role.  I recommend a trial of proton pump inhibitor once daily and I will be interested to hear how her upper  GI symptoms resolve when she returns to see me in 6 or 7 weeks.    Please see the "Patient Instructions" section for addition details about the plan.   Owens Loffler, MD [facility] 02/02/2019, 1:37 PM  Cc: Orma Flaming, MD

## 2019-02-02 NOTE — Patient Instructions (Addendum)
Please purchase the following medications over the counter and take as directed: Citrucel orange flavor daiy  We have sent the following medications to your pharmacy for you to pick up at your convenience: Omeprazole 40 mg daily  Follow up in 6-7 weeks  Thank you for entrusting me with your care and choosing [facility].  Dr Ardis Hughs

## 2019-02-20 ENCOUNTER — Ambulatory Visit (INDEPENDENT_AMBULATORY_CARE_PROVIDER_SITE_OTHER): Payer: 59 | Admitting: Physician Assistant

## 2019-02-20 ENCOUNTER — Encounter: Payer: Self-pay | Admitting: Physician Assistant

## 2019-02-20 DIAGNOSIS — M5442 Lumbago with sciatica, left side: Secondary | ICD-10-CM | POA: Diagnosis not present

## 2019-02-20 MED ORDER — CYCLOBENZAPRINE HCL 10 MG PO TABS: 10.0000 mg | ORAL_TABLET | Freq: Three times a day (TID) | ORAL | 0 refills | Status: DC | PRN

## 2019-02-20 MED ORDER — DICLOFENAC SODIUM 75 MG PO TBEC: 75.0000 mg | DELAYED_RELEASE_TABLET | Freq: Two times a day (BID) | ORAL | 0 refills | Status: DC

## 2019-02-20 MED ORDER — OMEPRAZOLE 40 MG PO CPDR: 40.0000 mg | DELAYED_RELEASE_CAPSULE | Freq: Every day | ORAL | 0 refills | Status: DC

## 2019-02-20 NOTE — Progress Notes (Signed)
Virtual Visit via Video   I connected with [name] [name] on 02/20/19 at  2:20 PM EDT by a video enabled telemedicine application and verified that I am speaking with the correct person using two identifiers. Location patient: Home Location provider: [facility], Office Persons participating in the virtual visit: Aryona, Sill PA-C.  I discussed the limitations of evaluation and management by telemedicine and the availability of in person appointments. The patient expressed understanding and agreed to proceed.  Subjective:   HPI:   Back pain Pt c/o back pain x 3 days, it is on her lower left side. Hx of DDD. Occasionally has worsening back pain. Has tried ibuprofen without much relief. Denies: dysuria, stool/urine incontinence, saddle anesthesia, blood per rectum, fever, radiation of pain, numbness/tingling. Denies any inciting event.   ROS: See pertinent positives and negatives per HPI.  Patient Active Problem List   Diagnosis Date Noted  . Acquired hypothyroidism 01/01/2019  . Mastoiditis 11/13/2018  . Chronic migraine 12/13/2015  . Low back pain 12/13/2015  . Morbid obesity ([place]) 11/01/2015    Social History   Tobacco Use  . Smoking status: Former Smoker    Quit date: 10/25/2010    Years since quitting: 8.3  . Smokeless tobacco: Never Used  Substance Use Topics  . Alcohol use: No    Comment: socially    Current Outpatient Medications:  .  acetaminophen (TYLENOL) 325 MG tablet, Take 650 mg by mouth every 6 (six) hours as needed for mild pain or fever., Disp: , Rfl:  .  cyclobenzaprine (FLEXERIL) 10 MG tablet, Take 1 tablet (10 mg total) by mouth 3 (three) times daily as needed for muscle spasms., Disp: 30 tablet, Rfl: 0 .  diclofenac (VOLTAREN) 75 MG EC tablet, Take 1 tablet (75 mg total) by mouth 2 (two) times daily., Disp: 30 tablet, Rfl: 0 .  levothyroxine (SYNTHROID) 25 MCG tablet, Take 1 tablet (25 mcg total) by mouth daily before breakfast.  Repeat labs in 6-8 weeks, Disp: 90 tablet, Rfl: 0 .  omeprazole (PRILOSEC) 40 MG capsule, Take 1 capsule (40 mg total) by mouth daily., Disp: 30 capsule, Rfl: 0  Allergies  Allergen Reactions  . Vancomycin Itching    Objective:   VITALS: Per patient if applicable, see vitals. GENERAL: Alert, appears well and in no acute distress. HEENT: Atraumatic, conjunctiva clear, no obvious abnormalities on inspection of external nose and ears. NECK: Normal movements of the head and neck. CARDIOPULMONARY: No increased WOB. Speaking in clear sentences. I:E ratio WNL.  MS: Moves all visible extremities without noticeable abnormality. PSYCH: Pleasant and cooperative, well-groomed. Speech normal rate and rhythm. Affect is appropriate. Insight and judgement are appropriate. Attention is focused, linear, and appropriate.  NEURO: CN grossly intact. Oriented as arrived to appointment on time with no prompting. Moves both UE equally.  SKIN: No obvious lesions, wounds, erythema, or cyanosis noted on face or hands.  Assessment and Plan:   [name] [name] was seen today for back pain.  Diagnoses and all orders for this visit:  Acute left-sided low back pain with left-sided sciatica  Other orders -     diclofenac (VOLTAREN) 75 MG EC tablet; Take 1 tablet (75 mg total) by mouth 2 (two) times daily. -     omeprazole (PRILOSEC) 40 MG capsule; Take 1 capsule (40 mg total) by mouth daily. -     cyclobenzaprine (FLEXERIL) 10 MG tablet; Take 1 tablet (10 mg total) by mouth 3 (three) times daily as  needed for muscle spasms.   No red flags on discussion. Start prilosec for GI protection, diclofenac BID for a few days. May use flexeril prn. If no improvement or any changes in symptoms, needs evaluation in office or urgent care.  . Reviewed expectations re: course of current medical issues. . Discussed self-management of symptoms. . Outlined signs and symptoms indicating need for more acute intervention. . Patient  verbalized understanding and all questions were answered. Marland Kitchen Health Maintenance issues including appropriate healthy diet, exercise, and smoking avoidance were discussed with patient. . See orders for this visit as documented in the electronic medical record.  I discussed the assessment and treatment plan with the patient. The patient was provided an opportunity to ask questions and all were answered. The patient agreed with the plan and demonstrated an understanding of the instructions.   The patient was advised to call back or seek an in-person evaluation if the symptoms worsen or if the condition fails to improve as anticipated.     [place], [place] 02/20/2019

## 2019-02-20 NOTE — Telephone Encounter (Signed)
I spoke with pt and she is on the schedule to see Sam today.  Patient preferred to have a virtual visit.

## 2019-03-09 ENCOUNTER — Encounter: Payer: Self-pay | Admitting: Family Medicine

## 2019-03-13 ENCOUNTER — Other Ambulatory Visit: Payer: Self-pay | Admitting: Gastroenterology

## 2019-03-13 DIAGNOSIS — R1011 Right upper quadrant pain: Secondary | ICD-10-CM

## 2019-03-18 ENCOUNTER — Encounter (HOSPITAL_COMMUNITY)
Admission: RE | Admit: 2019-03-18 | Discharge: 2019-03-18 | Disposition: A | Discharge status: Home / Self Care | Payer: 59 | Source: Ambulatory Visit | Attending: Gastroenterology | Admitting: Gastroenterology

## 2019-03-18 ENCOUNTER — Other Ambulatory Visit: Payer: Self-pay

## 2019-03-18 DIAGNOSIS — R1011 Right upper quadrant pain: Secondary | ICD-10-CM | POA: Diagnosis present

## 2019-03-18 MED ORDER — TECHNETIUM TC 99M MEBROFENIN IV KIT
5.0000 | PACK | Freq: Once | INTRAVENOUS | Status: AC | PRN
  Administered 2019-03-18: 14:00:00 5 via INTRAVENOUS

## 2019-03-26 ENCOUNTER — Other Ambulatory Visit: Payer: Self-pay | Admitting: Gastroenterology

## 2019-03-26 DIAGNOSIS — R1012 Left upper quadrant pain: Secondary | ICD-10-CM

## 2019-03-26 DIAGNOSIS — R1011 Right upper quadrant pain: Secondary | ICD-10-CM

## 2019-03-27 ENCOUNTER — Ambulatory Visit
Admission: RE | Admit: 2019-03-27 | Discharge: 2019-03-27 | Disposition: A | Discharge status: Home / Self Care | Payer: 59 | Source: Ambulatory Visit | Attending: Gastroenterology | Admitting: Gastroenterology

## 2019-03-27 DIAGNOSIS — R1012 Left upper quadrant pain: Secondary | ICD-10-CM

## 2019-03-27 DIAGNOSIS — R1011 Right upper quadrant pain: Secondary | ICD-10-CM

## 2019-03-27 MED ORDER — IOPAMIDOL (ISOVUE-300) INJECTION 61%
125.0000 mL | Freq: Once | INTRAVENOUS | Status: AC | PRN
  Administered 2019-03-27: 10:00:00 125 mL via INTRAVENOUS

## 2019-04-02 ENCOUNTER — Other Ambulatory Visit: Payer: 59

## 2019-04-02 ENCOUNTER — Other Ambulatory Visit: Payer: Self-pay | Admitting: Gastroenterology

## 2019-04-02 DIAGNOSIS — R1310 Dysphagia, unspecified: Secondary | ICD-10-CM

## 2019-04-03 ENCOUNTER — Ambulatory Visit
Admission: RE | Admit: 2019-04-03 | Discharge: 2019-04-03 | Disposition: A | Discharge status: Home / Self Care | Payer: 59 | Source: Ambulatory Visit | Attending: Gastroenterology | Admitting: Gastroenterology

## 2019-04-03 DIAGNOSIS — R1310 Dysphagia, unspecified: Secondary | ICD-10-CM

## 2019-04-24 ENCOUNTER — Other Ambulatory Visit: Payer: Self-pay

## 2019-04-24 ENCOUNTER — Ambulatory Visit (HOSPITAL_COMMUNITY)
Admission: EM | Admit: 2019-04-24 | Discharge: 2019-04-24 | Disposition: A | Discharge status: Home / Self Care | Payer: 59 | Attending: Family Medicine | Admitting: Family Medicine

## 2019-04-24 ENCOUNTER — Encounter (HOSPITAL_COMMUNITY): Payer: Self-pay | Admitting: Family Medicine

## 2019-04-24 DIAGNOSIS — N760 Acute vaginitis: Secondary | ICD-10-CM

## 2019-04-24 LAB — POCT URINALYSIS DIP (DEVICE)
Bilirubin Urine: NEGATIVE
Glucose, UA: NEGATIVE mg/dL
Hgb urine dipstick: NEGATIVE
Ketones, ur: NEGATIVE mg/dL
Nitrite: NEGATIVE
Protein, ur: NEGATIVE mg/dL
Specific Gravity, Urine: 1.025 (ref 1.005–1.030)
Urobilinogen, UA: 0.2 mg/dL (ref 0.0–1.0)
pH: 6 (ref 5.0–8.0)

## 2019-04-24 MED ORDER — FLUCONAZOLE 150 MG PO TABS: 150.0000 mg | ORAL_TABLET | Freq: Once | ORAL | 0 refills | Status: AC

## 2019-04-24 NOTE — ED Triage Notes (Signed)
Pt reports vaginal itching, irritation, and swelling that started yesterday.  She has not noticed any discharge.  Pt took one Monistat pill last night.

## 2019-04-24 NOTE — ED Provider Notes (Signed)
[facility]    CSN: [id number] Arrival date & time: 04/24/19  [id number]      History   Chief Complaint Chief Complaint  Patient presents with  . Vaginitis    HPI [name] [name] is a 23 y.o. female.   23 yo established [facility] patient with complaint of vaginal irritation.  General itching began 24 hours ago and today she has more inflammation.  She has had no discharge.  She is had the same partner for 6 years and she is not worried about STDs.         Past Medical History:  Diagnosis Date  . Anemia   . Fibroadenoma of left breast    needs follow up 1/20  . Fibroid   . Headache   . History of chicken pox   . Medical history non-contributory   . Streptococcus B carrier or suspected carrier   . Thyroid disease     Patient Active Problem List   Diagnosis Date Noted  . Acquired hypothyroidism 01/01/2019  . Mastoiditis 11/13/2018  . Chronic migraine 12/13/2015  . Low back pain 12/13/2015  . Morbid obesity ([place]) 11/01/2015    Past Surgical History:  Procedure Laterality Date  . NO PAST SURGERIES      OB History    Gravida  2   Para  2   Term  2   Preterm  0   AB  0   Living  2     SAB  0   TAB  0   Ectopic  0   Multiple  0   Live Births  2            Home Medications    Prior to Admission medications   Medication Sig Start Date End Date Taking? Authorizing Provider  levothyroxine (SYNTHROID) 25 MCG tablet Take 1 tablet (25 mcg total) by mouth daily before breakfast. Repeat labs in 6-8 weeks 01/01/19  Yes Orma Flaming, MD  omeprazole (PRILOSEC) 40 MG capsule Take 1 capsule (40 mg total) by mouth daily. 02/20/19  Yes Inda Coke, PA  acetaminophen (TYLENOL) 325 MG tablet Take 650 mg by mouth every 6 (six) hours as needed for mild pain or fever.    [provider]  fluconazole (DIFLUCAN) 150 MG tablet Take 1 tablet (150 mg total) by mouth once for 1 dose. Repeat if needed 04/24/19 04/24/19  Robyn Haber, MD     Family History Family History  Problem Relation Age of Onset  . Healthy Mother   . Hypertension Father   . Hyperlipidemia Father   . Mental retardation Brother   . COPD Maternal Grandmother   . Breast cancer Maternal Grandmother   . Bladder Cancer Paternal Grandmother     Social History Social History   Tobacco Use  . Smoking status: Former Smoker    Quit date: 10/25/2010    Years since quitting: 8.5  . Smokeless tobacco: Never Used  Substance Use Topics  . Alcohol use: No    Comment: socially  . Drug use: No     Allergies   Vancomycin   Review of Systems Review of Systems   Physical Exam Triage Vital Signs ED Triage Vitals  Enc Vitals Group     BP      Pulse      Resp      Temp      Temp src      SpO2      Weight  Height      Head Circumference      Peak Flow      Pain Score      Pain Loc      Pain Edu?      Excl. in [place]?    No data found.  Updated Vital Signs BP 118/82   Pulse 77   Temp 97.9 F (36.6 C) (Temporal)   Resp 16   LMP 04/02/2019 (Exact Date)   SpO2 99%   Physical Exam Vitals signs and nursing note reviewed.  Constitutional:      Appearance: Normal appearance.  HENT:     Head: Normocephalic.  Eyes:     Conjunctiva/sclera: Conjunctivae normal.  Neck:     Musculoskeletal: Normal range of motion and neck supple.  Pulmonary:     Effort: Pulmonary effort is normal.  Musculoskeletal: Normal range of motion.  Skin:    General: Skin is warm and dry.  Neurological:     General: No focal deficit present.     Mental Status: She is alert.  Psychiatric:        Mood and Affect: Mood normal.      UC Treatments / Results  Labs (all labs ordered are listed, but only abnormal results are displayed) Labs Reviewed  POCT URINALYSIS DIP (DEVICE)    EKG   Radiology No results found.  Procedures Procedures (including critical care time)  Medications Ordered in UC Medications - No data to display  Initial Impression /  Assessment and Plan / UC Course  I have reviewed the triage vital signs and the nursing notes.  Pertinent labs & imaging results that were available during my care of the patient were reviewed by me and considered in my medical decision making (see chart for details).     Final Clinical Impressions(s) / UC Diagnoses   Final diagnoses:  Vaginitis and vulvovaginitis   Discharge Instructions   None    ED Prescriptions    Medication Sig Dispense Auth. Provider   fluconazole (DIFLUCAN) 150 MG tablet Take 1 tablet (150 mg total) by mouth once for 1 dose. Repeat if needed 2 tablet Robyn Haber, MD     I have reviewed the PDMP during this encounter.   Robyn Haber, MD 04/24/19 0830

## 2019-05-13 ENCOUNTER — Encounter: Payer: Self-pay | Admitting: Family Medicine

## 2019-05-13 ENCOUNTER — Other Ambulatory Visit: Payer: Self-pay

## 2019-05-13 ENCOUNTER — Ambulatory Visit (INDEPENDENT_AMBULATORY_CARE_PROVIDER_SITE_OTHER): Payer: 59 | Admitting: Family Medicine

## 2019-05-13 VITALS — BP 120/80 | HR 88 | Temp 98.3°F | Ht 67.0 in | Wt 254.0 lb

## 2019-05-13 DIAGNOSIS — Z9109 Other allergy status, other than to drugs and biological substances: Secondary | ICD-10-CM

## 2019-05-13 DIAGNOSIS — E039 Hypothyroidism, unspecified: Secondary | ICD-10-CM

## 2019-05-13 DIAGNOSIS — Z0000 Encounter for general adult medical examination without abnormal findings: Secondary | ICD-10-CM | POA: Diagnosis not present

## 2019-05-13 DIAGNOSIS — R1012 Left upper quadrant pain: Secondary | ICD-10-CM

## 2019-05-13 LAB — CBC WITH DIFFERENTIAL/PLATELET
Basophils Absolute: 0.1 10*3/uL (ref 0.0–0.1)
Basophils Relative: 0.7 % (ref 0.0–3.0)
Eosinophils Absolute: 0.1 10*3/uL (ref 0.0–0.7)
Eosinophils Relative: 1 % (ref 0.0–5.0)
HCT: 39 % (ref 36.0–46.0)
Hemoglobin: 13 g/dL (ref 12.0–15.0)
Lymphocytes Relative: 33.9 % (ref 12.0–46.0)
Lymphs Abs: 2.9 10*3/uL (ref 0.7–4.0)
MCHC: 33.3 g/dL (ref 30.0–36.0)
MCV: 85.5 fl (ref 78.0–100.0)
Monocytes Absolute: 0.5 10*3/uL (ref 0.1–1.0)
Monocytes Relative: 6.1 % (ref 3.0–12.0)
Neutro Abs: 5 10*3/uL (ref 1.4–7.7)
Neutrophils Relative %: 58.3 % (ref 43.0–77.0)
Platelets: 332 10*3/uL (ref 150.0–400.0)
RBC: 4.56 Mil/uL (ref 3.87–5.11)
RDW: 14 % (ref 11.5–15.5)
WBC: 8.6 10*3/uL (ref 4.0–10.5)

## 2019-05-13 LAB — COMPREHENSIVE METABOLIC PANEL
ALT: 22 U/L (ref 0–35)
AST: 14 U/L (ref 0–37)
Albumin: 4.6 g/dL (ref 3.5–5.2)
Alkaline Phosphatase: 58 U/L (ref 39–117)
BUN: 10 mg/dL (ref 6–23)
CO2: 27 mEq/L (ref 19–32)
Calcium: 10 mg/dL (ref 8.4–10.5)
Chloride: 104 mEq/L (ref 96–112)
Creatinine, Ser: 0.69 mg/dL (ref 0.40–1.20)
GFR: 103.1 mL/min (ref >60.00)
Glucose, Bld: 71 mg/dL (ref 70–99)
Potassium: 4.3 mEq/L (ref 3.5–5.1)
Sodium: 140 mEq/L (ref 135–145)
Total Bilirubin: 0.5 mg/dL (ref 0.2–1.2)
Total Protein: 7.2 g/dL (ref 6.0–8.3)

## 2019-05-13 LAB — LDL CHOLESTEROL, DIRECT: Direct LDL: 97 mg/dL

## 2019-05-13 LAB — LIPID PANEL
Cholesterol: 148 mg/dL (ref 0–200)
HDL: 36.7 mg/dL — ABNORMAL LOW (ref >39.00)
NonHDL: 110.86
Total CHOL/HDL Ratio: 4
Triglycerides: 212 mg/dL — ABNORMAL HIGH (ref 0.0–149.0)
VLDL: 42.4 mg/dL — ABNORMAL HIGH (ref 0.0–40.0)

## 2019-05-13 LAB — T4, FREE: Free T4: 0.64 ng/dL (ref 0.60–1.60)

## 2019-05-13 LAB — TSH: TSH: 11.63 u[IU]/mL — ABNORMAL HIGH (ref 0.35–4.50)

## 2019-05-13 MED ORDER — LEVOTHYROXINE SODIUM 25 MCG PO TABS: 25.0000 ug | ORAL_TABLET | Freq: Every day | ORAL | 0 refills | Status: DC

## 2019-05-13 MED ORDER — GABAPENTIN 300 MG PO CAPS: 300.0000 mg | ORAL_CAPSULE | Freq: Three times a day (TID) | ORAL | 3 refills | Status: DC

## 2019-05-13 NOTE — Patient Instructions (Signed)
REPEAT thyroid labs in 6-8 weeks Starting you on gabapentin for possible nerve pain in LUQ. Can take up to three times a day if needed. Let me know your thoughts.   Preventive Care 10-25 Years Old, Female Preventive care refers to visits with your health care provider and lifestyle choices that can promote health and wellness. This includes:  A yearly physical exam. This may also be called an annual well check.  Regular dental visits and eye exams.  Immunizations.  Screening for certain conditions.  Healthy lifestyle choices, such as eating a healthy diet, getting regular exercise, not using drugs or products that contain nicotine and tobacco, and limiting alcohol use. What can I expect for my preventive care visit? Physical exam Your health care provider will check your:  Height and weight. This may be used to calculate body mass index (BMI), which tells if you are at a healthy weight.  Heart rate and blood pressure.  Skin for abnormal spots. Counseling Your health care provider may ask you questions about your:  Alcohol, tobacco, and drug use.  Emotional well-being.  Home and relationship well-being.  Sexual activity.  Eating habits.  Work and work Statistician.  Method of birth control.  Menstrual cycle.  Pregnancy history. What immunizations do I need?  Influenza (flu) vaccine  This is recommended every year. Tetanus, diphtheria, and pertussis (Tdap) vaccine  You may need a Td booster every 10 years. Varicella (chickenpox) vaccine  You may need this if you have not been vaccinated. Human papillomavirus (HPV) vaccine  If recommended by your health care provider, you may need three doses over 6 months. Measles, mumps, and rubella (MMR) vaccine  You may need at least one dose of MMR. You may also need a second dose. Meningococcal conjugate (MenACWY) vaccine  One dose is recommended if you are age 69-21 years and a first-year college student living in a  residence hall, or if you have one of several medical conditions. You may also need additional booster doses. Pneumococcal conjugate (PCV13) vaccine  You may need this if you have certain conditions and were not previously vaccinated. Pneumococcal polysaccharide (PPSV23) vaccine  You may need one or two doses if you smoke cigarettes or if you have certain conditions. Hepatitis A vaccine  You may need this if you have certain conditions or if you travel or work in places where you may be exposed to hepatitis A. Hepatitis B vaccine  You may need this if you have certain conditions or if you travel or work in places where you may be exposed to hepatitis B. Haemophilus influenzae type b (Hib) vaccine  You may need this if you have certain conditions. You may receive vaccines as individual doses or as more than one vaccine together in one shot (combination vaccines). Talk with your health care provider about the risks and benefits of combination vaccines. What tests do I need?  Blood tests  Lipid and cholesterol levels. These may be checked every 5 years starting at age 64.  Hepatitis C test.  Hepatitis B test. Screening  Diabetes screening. This is done by checking your blood sugar (glucose) after you have not eaten for a while (fasting).  Sexually transmitted disease (STD) testing.  BRCA-related cancer screening. This may be done if you have a family history of breast, ovarian, tubal, or peritoneal cancers.  Pelvic exam and Pap test. This may be done every 3 years starting at age 12. Starting at age 9, this may be done every 5  years if you have a Pap test in combination with an HPV test. Talk with your health care provider about your test results, treatment options, and if necessary, the need for more tests. Follow these instructions at home: Eating and drinking   Eat a diet that includes fresh fruits and vegetables, whole grains, lean protein, and low-fat dairy.  Take vitamin  and mineral supplements as recommended by your health care provider.  Do not drink alcohol if: ? Your health care provider tells you not to drink. ? You are pregnant, may be pregnant, or are planning to become pregnant.  If you drink alcohol: ? Limit how much you have to 0-1 drink a day. ? Be aware of how much alcohol is in your drink. In the [place] one drink equals one 12 oz bottle of beer (355 mL), one 5 oz glass of wine (148 mL), or one 1 oz glass of hard liquor (44 mL). Lifestyle  Take daily care of your teeth and gums.  Stay active. Exercise for at least 30 minutes on 5 or more days each week.  Do not use any products that contain nicotine or tobacco, such as cigarettes, e-cigarettes, and chewing tobacco. If you need help quitting, ask your health care provider.  If you are sexually active, practice safe sex. Use a condom or other form of birth control (contraception) in order to prevent pregnancy and STIs (sexually transmitted infections). If you plan to become pregnant, see your health care provider for a preconception visit. What's next?  Visit your health care provider once a year for a well check visit.  Ask your health care provider how often you should have your eyes and teeth checked.  Stay up to date on all vaccines. This information is not intended to replace advice given to you by your health care provider. Make sure you discuss any questions you have with your health care provider. Document Released: 09/18/2001 Document Revised: 04/03/2018 Document Reviewed: 04/03/2018 Elsevier Patient Education  2020 [employer].

## 2019-05-13 NOTE — Progress Notes (Signed)
Patient: [name] [name] MRN: [id number] DOB: 10-27-1995 PCP: Orma Flaming, MD     Subjective:  Chief Complaint  Patient presents with  . Annual Exam    HPI: The patient is a 23 y.o. female who presents today for annual exam. She denies any changes to past medical history. There have been no recent hospitalizations. They are following a well balanced diet and exercise plan. Weight has been decreasing steadily. Declines flu shot today.   No first degree relative with breast or colon cancer.   She has lost 15 pounds since august.   She is being seen by GI: Dr. Collene Mares and had a big work up. Next step is endoscopy.   Hypothyroidism: stopped taking medication. Labs are likely going to be abnormal. Having some cramping in her right leg and joint pain. Has noticed a difference off medication. Unsure why she stopped this and didn't come in for lab work.   Would like referral to allergist. Has a lot of mold in her office and thinks she may have allergy to this.   There is no immunization history for the selected administration types on file for this patient.   Colonoscopy: routine screening.  Mammogram: routine screening.  Pap smear: 04/02/2018. Wnl. Repeat in 3 years.    Review of Systems  Constitutional: Negative for chills, fatigue and fever.  HENT: Negative for congestion, dental problem, ear pain, hearing loss, postnasal drip, rhinorrhea, sore throat and trouble swallowing.   Eyes: Negative for visual disturbance.  Respiratory: Negative for cough, chest tightness and shortness of breath.   Cardiovascular: Negative for chest pain, palpitations and leg swelling.  Gastrointestinal: Positive for abdominal pain. Negative for blood in stool, diarrhea, nausea and vomiting.  Endocrine: Negative for cold intolerance, polydipsia, polyphagia and polyuria.  Genitourinary: Negative for dysuria, frequency, hematuria, pelvic pain and urgency.  Musculoskeletal: Positive for back pain and neck  pain. Negative for arthralgias and myalgias.       C/o chronic back and neck pain  Skin: Negative for rash.  Neurological: Positive for headaches. Negative for dizziness.  Psychiatric/Behavioral: Negative for dysphoric mood and sleep disturbance. The patient is not nervous/anxious.     Allergies Patient is allergic to vancomycin.  Past Medical History Patient  has a past medical history of Anemia, Fibroadenoma of left breast, Fibroid, Headache, History of chicken pox, Medical history non-contributory, Streptococcus B carrier or suspected carrier, and Thyroid disease.  Surgical History Patient  has a past surgical history that includes No past surgeries.  Family History Pateint's family history includes Bladder Cancer in her paternal grandmother; Breast cancer in her maternal grandmother; COPD in her maternal grandmother; Healthy in her mother; Hyperlipidemia in her father; Hypertension in her father; Mental retardation in her brother.  Social History Patient  reports that she quit smoking about 8 years ago. She has never used smokeless tobacco. She reports that she does not drink alcohol or use drugs.    Objective: Vitals:   05/13/19 1256 05/13/19 1325  BP: 104/90 120/80  Pulse: 88   Temp: 98.3 F (36.8 C)   TempSrc: Skin   SpO2: 98%   Weight: 254 lb (115.2 kg)   Height: 5\' 7"  (1.702 m)     Body mass index is 39.78 kg/m.  Physical Exam Vitals signs reviewed.  Constitutional:      Appearance: Normal appearance. She is well-developed. She is obese.  HENT:     Head: Normocephalic and atraumatic.     Right Ear: Tympanic membrane, ear canal  and external ear normal.     Left Ear: Tympanic membrane, ear canal and external ear normal.     Nose: Nose normal.     Mouth/Throat:     Mouth: Mucous membranes are moist.  Eyes:     Extraocular Movements: Extraocular movements intact.     Conjunctiva/sclera: Conjunctivae normal.     Pupils: Pupils are equal, round, and reactive to  light.  Neck:     Musculoskeletal: Normal range of motion and neck supple.     Thyroid: No thyromegaly.  Cardiovascular:     Rate and Rhythm: Normal rate and regular rhythm.     Heart sounds: Normal heart sounds. No murmur.  Pulmonary:     Effort: Pulmonary effort is normal.     Breath sounds: Normal breath sounds.  Abdominal:     General: Bowel sounds are normal. There is no distension.     Palpations: Abdomen is soft.     Tenderness: There is no abdominal tenderness.     Comments: Burning pain under her left lower rib   Lymphadenopathy:     Cervical: No cervical adenopathy.  Skin:    General: Skin is warm and dry.     Capillary Refill: Capillary refill takes less than 2 seconds.     Findings: No rash.  Neurological:     General: No focal deficit present.     Mental Status: She is alert and oriented to person, place, and time.     Cranial Nerves: No cranial nerve deficit.     Coordination: Coordination normal.     Deep Tendon Reflexes: Reflexes normal.  Psychiatric:        Mood and Affect: Mood normal.        Behavior: Behavior normal.         Depression screen [facility] 2/9 05/13/2019 07/14/2018 08/26/2015  Decreased Interest 0 0 0  Down, Depressed, Hopeless 0 0 0  PHQ - 2 Score 0 0 0    Assessment/plan: 1. Annual physical exam Routine lab work today. She is doing a great job with exercise and weight loss. Keep up the good work. Goal is 200 pounds. She declines flu shot, otherwise utd on her HM. F/u in one year or as needed.  Patient counseling [x]    Nutrition: Stressed importance of moderation in sodium/caffeine intake, saturated fat and cholesterol, caloric balance, sufficient intake of fresh fruits, vegetables, fiber, calcium, iron, and 1 mg of folate supplement per day (for females capable of pregnancy).  [x]    Stressed the importance of regular exercise.   []    Substance Abuse: Discussed cessation/primary prevention of tobacco, alcohol, or other drug use; driving or other  dangerous activities under the influence; availability of treatment for abuse.   [x]    Injury prevention: Discussed safety belts, safety helmets, smoke detector, smoking near bedding or upholstery.   [x]    Sexuality: Discussed sexually transmitted diseases, partner selection, use of condoms, avoidance of unintended pregnancy  and contraceptive alternatives.  [x]    Dental health: Discussed importance of regular tooth brushing, flossing, and dental visits.  [x]    Health maintenance and immunizations reviewed. Please refer to Health maintenance section.    - CBC with Differential/Platelet - Comprehensive metabolic panel - Lipid panel  2. Acquired hypothyroidism Starting her back on 21mcg daily. Discussed importance of taking this and must come back for labs in 6-8 weeks.  - TSH - T4, free  3. Environmental allergies  - Ambulatory referral to Allergy  4. LUQ pain Don't  really feel like this is a GI issue. Will see if gabapentin helps her as it almost seems like this could be an irritated nerve. Starting her low dose at 300mg  daily to bid. Side effects discussed. Will let me know how she is doing and has f/u with GI.     Return in about 1 year (around 05/12/2020) for 6-8 weeks for lab appointment for thyroid. Orma Flaming, MD [place]  05/13/2019

## 2019-05-14 ENCOUNTER — Encounter: Payer: Self-pay | Admitting: Family Medicine

## 2019-05-14 ENCOUNTER — Other Ambulatory Visit: Payer: Self-pay | Admitting: Family Medicine

## 2019-05-14 DIAGNOSIS — E039 Hypothyroidism, unspecified: Secondary | ICD-10-CM

## 2019-07-01 ENCOUNTER — Encounter (HOSPITAL_COMMUNITY): Payer: Self-pay | Admitting: Emergency Medicine

## 2019-07-01 ENCOUNTER — Other Ambulatory Visit: Payer: Self-pay

## 2019-07-01 ENCOUNTER — Ambulatory Visit (HOSPITAL_COMMUNITY)
Admission: EM | Admit: 2019-07-01 | Discharge: 2019-07-01 | Disposition: A | Discharge status: Home / Self Care | Payer: Self-pay | Attending: Family Medicine | Admitting: Family Medicine

## 2019-07-01 DIAGNOSIS — M62838 Other muscle spasm: Secondary | ICD-10-CM

## 2019-07-01 MED ORDER — NAPROXEN SODIUM 220 MG PO TABS: 220.0000 mg | ORAL_TABLET | Freq: Two times a day (BID) | ORAL | 0 refills | Status: DC

## 2019-07-01 MED ORDER — CYCLOBENZAPRINE HCL 5 MG PO TABS: 5.0000 mg | ORAL_TABLET | Freq: Every day | ORAL | 0 refills | Status: DC

## 2019-07-01 NOTE — ED Provider Notes (Signed)
[place]    CSN: [id number] Arrival date & time: 07/01/19  1410      History   Chief Complaint Chief Complaint  Patient presents with  . Neck Pain    HPI [name] [name] is a 23 y.o. female.   [name] [name] presents with complaints of left lateral neck/ medial shoulder pain which she woke with this morning. Aching pain. Worse with movement of her left shoulder. No pain with neck movement. No fevers. No known injury. No numbness tingling or weakness. She is right handed. Pain 3/10. Hasn't taken any medications for pain. 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 Denies any previous similar. Without contributing medical history.      ROS per HPI, negative if not otherwise mentioned.      Past Medical History:  Diagnosis Date  . Anemia   . Fibroadenoma of left breast    needs follow up 1/20  . Fibroid   . Headache   . History of chicken pox   . Medical history non-contributory   . Streptococcus B carrier or suspected carrier   . Thyroid disease     Patient Active Problem List   Diagnosis Date Noted  . Acquired hypothyroidism 01/01/2019  . Mastoiditis 11/13/2018  . Chronic migraine 12/13/2015  . Low back pain 12/13/2015  . Morbid obesity ([place]) 11/01/2015    Past Surgical History:  Procedure Laterality Date  . NO PAST SURGERIES      OB History    Gravida  2   Para  2   Term  2   Preterm  0   AB  0   Living  2     SAB  0   TAB  0   Ectopic  0   Multiple  0   Live Births  2            Home Medications    Prior to Admission medications   Medication Sig Start Date End Date Taking? Authorizing Provider  levothyroxine (SYNTHROID) 25 MCG tablet Take 1 tablet (25 mcg total) by mouth daily before breakfast. Repeat labs in 6-8 weeks 05/13/19  Yes Orma Flaming, MD  acetaminophen (TYLENOL) 325 MG tablet Take 650 mg by mouth every 6 (six) hours as needed for mild pain or fever.    [provider]  cyclobenzaprine (FLEXERIL) 5 MG tablet Take 1 tablet (5 mg total) by mouth at bedtime. 07/01/19   Zigmund Gottron, NP  gabapentin (NEURONTIN) 300 MG capsule Take 1 capsule (300 mg total) by mouth 3 (three) times daily. 05/13/19   Orma Flaming, MD  naproxen sodium (ALEVE) 220 MG tablet Take 1-2 tablets (220-440 mg total) by mouth 2 (two) times daily with a meal. 07/01/19   Zigmund Gottron, NP  omeprazole (PRILOSEC) 40 MG capsule Take 1 capsule (40 mg total) by mouth daily. 02/20/19   Inda Coke, PA    Family History Family History  Problem Relation Age of Onset  . Healthy Mother   . Hypertension Father   . Hyperlipidemia Father   . Mental retardation Brother   . COPD Maternal Grandmother   . Breast cancer Maternal Grandmother   . Bladder Cancer Paternal Grandmother     Social History Social History   Tobacco Use  . Smoking status: Former Smoker    Quit date: 10/25/2010    Years since quitting: 8.6  . Smokeless tobacco: Never Used  Substance  Use Topics  . Alcohol use: No    Comment: socially  . Drug use: No     Allergies   Vancomycin   Review of Systems Review of Systems   Physical Exam Triage Vital Signs ED Triage Vitals  Enc Vitals Group     BP 07/01/19 1541 124/80     Pulse Rate 07/01/19 1541 98     Resp 07/01/19 1541 18     Temp 07/01/19 1541 98.2 F (36.8 C)     Temp Source 07/01/19 1541 Oral     SpO2 07/01/19 1541 99 %     Weight --      Height --      Head Circumference --      Peak Flow --      Pain Score 07/01/19 1535 7     Pain Loc --      Pain Edu? --      Excl. in [place]? --    No data found.  Updated Vital Signs BP 124/80 (BP Location: Right Arm) Comment (BP Location): large cuff  Pulse 98   Temp 98.2 F (36.8 C) (Oral)   Resp 18   LMP 06/13/2019   SpO2 99%    Physical Exam Constitutional:      General: She is not in acute distress.    Appearance: She is well-developed.  Cardiovascular:     Rate and Rhythm:  Normal rate.  Pulmonary:     Effort: Pulmonary effort is normal.  Musculoskeletal:     Left shoulder: She exhibits decreased range of motion, tenderness and pain. She exhibits no bony tenderness, no swelling, no effusion, no crepitus, no deformity, no laceration, no spasm, normal pulse and normal strength.       Arms:     Comments: Left lateral neck/ medial shoulder, at trapezius, with mild tenderness but with pain with raising of left shoulder; no pain with ac joint compression; no pain with left shoulder external rotation; strength equal bilaterally; gross sensation intact to upper extremities; full ROM of neck without pain; no spinous process tenderness  Skin:    General: Skin is warm and dry.  Neurological:     Mental Status: She is alert and oriented to person, place, and time.      UC Treatments / Results  Labs (all labs ordered are listed, but only abnormal results are displayed) Labs Reviewed - No data to display  EKG   Radiology No results found.  Procedures Procedures (including critical care time)  Medications Ordered in UC Medications - No data to display  Initial Impression / Assessment and Plan / UC Course  I have reviewed the triage vital signs and the nursing notes.  Pertinent labs & imaging results that were available during my care of the patient were reviewed by me and considered in my medical decision making (see chart for details).     Muscle strain, left trapezius. Pain management discussed. Return precautions provided. Patient verbalized understanding and agreeable to plan.   Final Clinical Impressions(s) / UC Diagnoses   Final diagnoses:  Trapezius muscle spasm     Discharge Instructions     Light and regular activity as tolerated.  Shoulder range of motion as able. Naproxen, 1-2 tablets daily for pain, take omeprazole daily while taking this and take it with food.  Flexeril at night. May cause drowsiness. Please do not take if driving or  drinking alcohol.   Follow up with sports medicine as needed for persistent symptoms.  ED Prescriptions    Medication Sig Dispense Auth. Provider   naproxen sodium (ALEVE) 220 MG tablet Take 1-2 tablets (220-440 mg total) by mouth 2 (two) times daily with a meal. 60 tablet Burky, Natalie B, NP   cyclobenzaprine (FLEXERIL) 5 MG tablet Take 1 tablet (5 mg total) by mouth at bedtime. 15 tablet Zigmund Gottron, NP     PDMP not reviewed this encounter.   Zigmund Gottron, NP 07/01/19 1827

## 2019-07-01 NOTE — ED Triage Notes (Signed)
Patient woke with left neck pain.  Initially only hurt with movement.  Now pain is all the time.  Pain is sharp with movement.  Pain is aching without movement.  No known injury

## 2019-07-01 NOTE — Discharge Instructions (Signed)
Light and regular activity as tolerated.  Shoulder range of motion as able. Naproxen, 1-2 tablets daily for pain, take omeprazole daily while taking this and take it with food.  Flexeril at night. May cause drowsiness. Please do not take if driving or drinking alcohol.   Follow up with sports medicine as needed for persistent symptoms.

## 2019-07-14 ENCOUNTER — Other Ambulatory Visit (INDEPENDENT_AMBULATORY_CARE_PROVIDER_SITE_OTHER): Payer: Self-pay

## 2019-07-14 DIAGNOSIS — E039 Hypothyroidism, unspecified: Secondary | ICD-10-CM

## 2019-07-14 LAB — T4, FREE: Free T4: 0.75 ng/dL (ref 0.60–1.60)

## 2019-07-14 LAB — TSH: TSH: 8.48 u[IU]/mL — ABNORMAL HIGH (ref 0.35–4.50)

## 2019-07-15 ENCOUNTER — Other Ambulatory Visit: Payer: Self-pay | Admitting: Family Medicine

## 2019-07-15 DIAGNOSIS — E039 Hypothyroidism, unspecified: Secondary | ICD-10-CM

## 2019-07-15 MED ORDER — LEVOTHYROXINE SODIUM 50 MCG PO TABS: 50.0000 ug | ORAL_TABLET | Freq: Every day | ORAL | 0 refills | Status: DC

## 2019-07-17 ENCOUNTER — Encounter: Payer: Self-pay | Admitting: Family Medicine

## 2019-07-17 ENCOUNTER — Ambulatory Visit (INDEPENDENT_AMBULATORY_CARE_PROVIDER_SITE_OTHER): Payer: Self-pay | Admitting: Family Medicine

## 2019-07-17 VITALS — BP 116/85 | HR 102 | Temp 97.9°F | Ht 67.0 in | Wt 252.2 lb

## 2019-07-17 DIAGNOSIS — S46211A Strain of muscle, fascia and tendon of other parts of biceps, right arm, initial encounter: Secondary | ICD-10-CM

## 2019-07-17 MED ORDER — TRAMADOL HCL 50 MG PO TABS: 50.0000 mg | ORAL_TABLET | Freq: Three times a day (TID) | ORAL | 0 refills | Status: AC | PRN

## 2019-07-17 MED ORDER — CYCLOBENZAPRINE HCL 10 MG PO TABS: 10.0000 mg | ORAL_TABLET | Freq: Three times a day (TID) | ORAL | 0 refills | Status: DC | PRN

## 2019-07-17 MED ORDER — IBUPROFEN 600 MG PO TABS: 600.0000 mg | ORAL_TABLET | Freq: Three times a day (TID) | ORAL | 0 refills | Status: DC | PRN

## 2019-07-17 NOTE — Patient Instructions (Addendum)
-  I think you have either really stressed out your distal bicep or have ruptured it. Keep arm in sling all weekend. Ice as needed. I have sent in ibuprofen for you to take up to three times a day with food. Tramadol for severe pain as needed. Also muscle relaxer to take at night to help sleep and relax your tricep muscle.   Seeing sports medicine here on Monday for further eval.    Dr. Rogers Blocker

## 2019-07-17 NOTE — Progress Notes (Signed)
Patient: [name] [name] MRN: [id number] DOB: 1996/08/04 PCP: Orma Flaming, MD     Subjective:  Chief Complaint  Patient presents with  . pulled muscle in R arm    HPI: The patient is a 23 y.o. female who presents today for pulled muscle in right arm.  Was working out with personal trainer this past Tuesday 12/8 and doing push ups.  She felt something like a pull while she was doing the push ups. She feels pain in the back of her right arm (tricep area) but pain then radiates anteriorly, down her bicep and into her forearm.  She did 3 sets of 12 push ups (non modified). Was fine the rest of the day and Wednesday but on 12/10, the pain was intense when woke up. Pain is a 7/10 at its worst. She states that last night it was so bad because of the pain. Pain is described as tearing or pulling in nature. Hasn't tried any over the counter medication. Does have a shoulder sling on and this helps. She is right handed.  She did try heat and this didn't help. If she takes her arm and extends it she has a lot of pain in her antecubital fossa. Immobilization does help, but is still sore.   Review of Systems  Constitutional: Negative for fatigue.  Respiratory: Negative for shortness of breath.   Cardiovascular: Negative for chest pain, palpitations and leg swelling.  Gastrointestinal: Positive for nausea. Negative for abdominal pain, diarrhea and vomiting.  Musculoskeletal: Positive for myalgias. Negative for neck pain and neck stiffness.       Pain in back of right arm, radiates down arm into fingertips.  Neurological: Negative for dizziness and headaches.  Psychiatric/Behavioral: Positive for sleep disturbance.    Allergies Patient is allergic to vancomycin.  Past Medical History Patient  has a past medical history of Anemia, Fibroadenoma of left breast, Fibroid, Headache, History of chicken pox, Medical history non-contributory, Streptococcus B carrier or suspected carrier, and Thyroid  disease.  Surgical History Patient  has a past surgical history that includes No past surgeries.  Family History Pateint's family history includes Bladder Cancer in her paternal grandmother; Breast cancer in her maternal grandmother; COPD in her maternal grandmother; Healthy in her mother; Hyperlipidemia in her father; Hypertension in her father; Mental retardation in her brother.  Social History Patient  reports that she quit smoking about 8 years ago. She has never used smokeless tobacco. She reports that she does not drink alcohol or use drugs.    Objective: Vitals:   07/17/19 1345  BP: 116/85  Pulse: (!) 102  Temp: 97.9 F (36.6 C)  TempSrc: Skin  SpO2: 98%  Weight: 252 lb 3.2 oz (114.4 kg)  Height: 5\' 7"  (1.702 m)    Body mass index is 39.5 kg/m.  Physical Exam Vitals reviewed.  Constitutional:      Appearance: Normal appearance. She is obese.     Comments: In pain  HENT:     Head: Normocephalic and atraumatic.  Cardiovascular:     Rate and Rhythm: Normal rate and regular rhythm.     Heart sounds: Normal heart sounds.  Pulmonary:     Effort: Pulmonary effort is normal.     Breath sounds: Normal breath sounds.  Musculoskeletal:     Comments: Right arm: very mild reverse popeye. +hooks test and positive supination of forearm when press on distal biceps. Tender in antecubital fossa and on distal bicep head. Warm to touch, no bruising. Also  tender over triceps and pectoralis muscle. Minimal swelling into forearm.   Neurological:     General: No focal deficit present.     Mental Status: She is alert and oriented to person, place, and time.        Assessment/plan: 1. Biceps rupture, distal, right, initial encounter Continue with sling/ice. Sending in ibuprofen to take TID with food and tramadol for severe pain. Will also do trial of flexeril at night to help tricep/pec pain. Will see sports medicine on Monday for ultrasound and further eval/plan of care.  -  Ambulatory referral to Sports Medicine   This visit occurred during the SARS-CoV-2 public health emergency.  Safety protocols were in place, including screening questions prior to the visit, additional usage of staff PPE, and extensive cleaning of exam room while observing appropriate contact time as indicated for disinfecting solutions.   Return if symptoms worsen or fail to improve.   Orma Flaming, MD [place]   07/17/2019

## 2019-07-20 ENCOUNTER — Other Ambulatory Visit: Payer: Self-pay

## 2019-07-20 ENCOUNTER — Ambulatory Visit (INDEPENDENT_AMBULATORY_CARE_PROVIDER_SITE_OTHER): Payer: Self-pay | Admitting: Family Medicine

## 2019-07-20 ENCOUNTER — Encounter: Payer: Self-pay | Admitting: Family Medicine

## 2019-07-20 ENCOUNTER — Ambulatory Visit: Payer: Self-pay

## 2019-07-20 VITALS — BP 120/78 | HR 86 | Ht 67.0 in | Wt 255.4 lb

## 2019-07-20 DIAGNOSIS — M79601 Pain in right arm: Secondary | ICD-10-CM

## 2019-07-20 NOTE — Progress Notes (Signed)
Subjective:    I'm seeing this patient as a consultation for:  Dr. Ranelle Oyster, [name] [name], LAT, ATC, am serving as scribe for Dr. Lynne Leader.  CC: R anterior elbow pain  HPI: Pt is a 23 y/o female presenting w/ c/o R distal upper arm / distal bicep pain.  Pt reports that she initially injured her R distal arm while doing weight lifting including push-ups and pulling activity for her upper back w/ her personal trainer on 07/14/19.  Pt reports having done 3x12 push-ups and notes initial pain along her distal triceps that then radiated into her R distal upper arm and antecubital fossa.  She describes a tearing/pulling-type pain and rates her pain as a 7/10 at it's worst.  Pt was seen by her PCP on 07/17/19 and was advised to stay in a sling, use ice and was prescribed Flexeril 10 mg tid prn, Tramadol 50 mg and IBU.  Since her visit last Friday w/ her PCP, pt notes improvement in her symptoms.  She reports more soreness and discomfort and rates her pain as a 2/10.  She denies any current numbness/tingling into her R UE.  Aggravating factors include cleaning/wiping motion as in wiping off a countertop.  Pt took Flexeril over the weekend but hasn't taken any today.    Past medical history, Surgical history, Family history not pertinant except as noted below, Social history, Allergies, and medications have been entered into the medical record, reviewed, and no changes needed.   Review of Systems: No headache, visual changes, nausea, vomiting, diarrhea, constipation, dizziness, abdominal pain, skin rash, fevers, chills, night sweats, weight loss, swollen lymph nodes, body aches, joint swelling, muscle aches, chest pain, shortness of breath, mood changes, visual or auditory hallucinations.   Objective:    Vitals:   07/20/19 0853  BP: 120/78  Pulse: 86  SpO2: 98%   General: Well Developed, well nourished, and in no acute distress.  Neuro/Psych: Alert and oriented x3, extra-ocular muscles  intact, able to move all 4 extremities, sensation grossly intact. Skin: Warm and dry, no rashes noted.  Respiratory: Not using accessory muscles, speaking in full sentences, trachea midline.  Cardiovascular: Pulses palpable, no extremity edema. Abdomen: Does not appear distended. MSK: Right anterior elbow normal-appearing with no bruising or deformity. Full elbow range of motion. Tender to palpation along palpation of distal biceps tendon however biceps tendon is intact with normal hook test. Minimal pain with resisted flexion of elbow with normal strength..  Normal pronation supination extension strength without pain. Wrist flexion extension normal with normal strength and nontender. Pulses cap refill and sensation are intact distally.  Lab and Radiology Results Limited musculoskeletal ultrasound right anterior elbow distal biceps tendon. Biceps tendon is intact and normal appearing to transverse views to insertion onto the radius. Difficult to visualize full long axis however partially visualized appear to be intact with no visible tear. Impression: Distal biceps tendon with no full-thickness tear.  Impression and Recommendations:    Assessment and Plan: 23 y.o. female with right anterior elbow pain.  Likely strain versus delayed onset muscle soreness secondary to weightlifting.  Fortunately patient is improving and has intact appearing biceps tendon to ultrasound examination and physical exam today.  Discussed home exercise program including stretching and strengthening.  Also briefly discussed weight loss strategies as well.  Recheck back with me as needed for this injury or subsequent injuries..   Orders Placed This Encounter  Procedures  . NO CHG - [place] UPPER RIGHT  Order Specific Question:   Reason for Exam (SYMPTOM  OR DIAGNOSIS REQUIRED)    Answer:   R elbow pain    Order Specific Question:   Preferred imaging location?    Answer:   [facility]   No orders of the  defined types were placed in this encounter.   Discussed warning signs or symptoms. Please see discharge instructions. Patient expresses understanding.  The above documentation has been reviewed and is accurate and complete Lynne Leader

## 2019-07-20 NOTE — Patient Instructions (Signed)
Thank you for coming in today. Good job working on [employer].   OK to resume exercises.  Work on stretching and eccentric muscle cool down.  Allow the muscle/tendon to go from the short position to the long position slowly with a little resistance.   Also make sure to eat a post exercise snack with carbs and protein.   For weight loss it takes a while.   Recheck with me as needed.   We're moving!  Dr. Clovis Riley new office will be located at [address] on the 1st floor.  This location is across the street from the [employer] and in the same complex as the [facility] and [employer]  Our new office phone number will be [phone].  We anticipate beginning to see patients at the [facility] office in early December 2020.

## 2019-07-21 ENCOUNTER — Encounter: Payer: Self-pay | Admitting: Family Medicine

## 2019-07-26 ENCOUNTER — Encounter: Payer: Self-pay | Admitting: Family Medicine

## 2019-07-26 ENCOUNTER — Emergency Department (HOSPITAL_COMMUNITY)
Admission: EM | Admit: 2019-07-26 | Discharge: 2019-07-26 | Disposition: A | Discharge status: Home / Self Care | Payer: Self-pay | Attending: Emergency Medicine | Admitting: Emergency Medicine

## 2019-07-26 ENCOUNTER — Other Ambulatory Visit: Payer: Self-pay

## 2019-07-26 DIAGNOSIS — L299 Pruritus, unspecified: Secondary | ICD-10-CM | POA: Insufficient documentation

## 2019-07-26 DIAGNOSIS — Z5321 Procedure and treatment not carried out due to patient leaving prior to being seen by health care provider: Secondary | ICD-10-CM | POA: Insufficient documentation

## 2019-07-26 DIAGNOSIS — R21 Rash and other nonspecific skin eruption: Secondary | ICD-10-CM | POA: Insufficient documentation

## 2019-07-26 NOTE — ED Triage Notes (Signed)
Pt said she has been having itching and a raised area on her abdomen that started today. No new lotions or any new detergents.

## 2019-07-26 NOTE — ED Notes (Signed)
Pt unable to be found in waiting area at this time.

## 2019-07-30 ENCOUNTER — Other Ambulatory Visit: Payer: Self-pay

## 2019-07-30 ENCOUNTER — Encounter: Payer: Self-pay | Admitting: Family Medicine

## 2019-07-30 ENCOUNTER — Ambulatory Visit (INDEPENDENT_AMBULATORY_CARE_PROVIDER_SITE_OTHER): Payer: Self-pay | Admitting: Family Medicine

## 2019-07-30 VITALS — BP 98/78 | HR 97 | Temp 98.3°F | Wt 249.2 lb

## 2019-07-30 DIAGNOSIS — F411 Generalized anxiety disorder: Secondary | ICD-10-CM

## 2019-07-30 DIAGNOSIS — R1900 Intra-abdominal and pelvic swelling, mass and lump, unspecified site: Secondary | ICD-10-CM

## 2019-07-30 MED ORDER — FLUOXETINE HCL 20 MG PO CAPS: 20.0000 mg | ORAL_CAPSULE | Freq: Every day | ORAL | 1 refills | Status: DC

## 2019-07-30 NOTE — Patient Instructions (Signed)
For your anxiety: we are going to start you on prozac. Take this daily after you eat. Will take 3-4 weeks to work, so don't expect a miracle over night. I will see you back in one month. Let me know if any issues such as GI, headache, etc.   email me in 2 weeks with update on your stomach. If not getting better we scan you...   Check out FODMOP diet for gas/belching, etc.

## 2019-07-30 NOTE — Progress Notes (Signed)
Patient: [name] [name] MRN: [id number] DOB: July 25, 1996 PCP: Orma Flaming, MD     Subjective:  Chief Complaint  Patient presents with  . lump in stomach  . excessive gassiness  . Anxiety    HPI: The patient is a 23 y.o. female who presents today for lump above belly buton.  Non tender to touch, Pain is more to the right of belly button.  She first noticed this lump Saturday night around midnight. She had worked out the Thursday before and did an ab work out. She was sore, but then the lump appeared on Saturday. She can see lump if she is standing up or laying on side. If she is lying on her back she doesn't see it. Hasn't really noticed it with bearing down. She doesn't really have any pain where the lump is, but does seem to have an "annoying feeling" to the right of the lump. Lump is located right above her belly button. She states it has not grown. No fever/chills, no N/V/D, no blood in stool.   She also has noticed she has excess gassiness and belching. She first noticed this on Monday. No diarrhea/constipation. No nausea/vomiting. This is out of the ordinary for her. She denies any diet change. She has lost 6 pounds since I saw her last time. (about 10 days) and overall about 30+ pounds from one year ago.    She also has complaints of anxiety regarding her health. She has had it her entire life where something will happen and it will consume her. It is affecting her marriage. She states she used to wash her hands excessively or if something is "off" on her body she freaks out and then thinks worse case scenario and it consumes her. She is very tearful talking about this today. She has never had any medication for anxiety. Hx of anxiety in her paternal grandmother. She also feels like she is very irritated all of the time and this was since her son was born. She feels like her husband can't do enough, but she knows he does a lot. No Si/hi/ah/vh. No sadness. She is just irritated. She also  feels very moody. She is not sure she has had panic attacks.   Review of Systems  Constitutional: Negative for chills, fatigue and fever.  Respiratory: Negative for shortness of breath.   Cardiovascular: Negative for chest pain.  Gastrointestinal: Positive for abdominal pain. Negative for blood in stool, constipation, diarrhea, nausea and vomiting.  Musculoskeletal: Negative for back pain.  Psychiatric/Behavioral: Negative for suicidal ideas. The patient is nervous/anxious.     Allergies Patient is allergic to vancomycin.  Past Medical History Patient  has a past medical history of Anemia, Fibroadenoma of left breast, Fibroid, Headache, History of chicken pox, Medical history non-contributory, Streptococcus B carrier or suspected carrier, and Thyroid disease.  Surgical History Patient  has a past surgical history that includes No past surgeries.  Family History Pateint's family history includes Bladder Cancer in her paternal grandmother; Breast cancer in her maternal grandmother; COPD in her maternal grandmother; Healthy in her mother; Hyperlipidemia in her father; Hypertension in her father; Mental retardation in her brother.  Social History Patient  reports that she quit smoking about 8 years ago. She has never used smokeless tobacco. She reports that she does not drink alcohol or use drugs.    Objective: Vitals:   07/30/19 0953  BP: 98/78  Pulse: 97  Temp: 98.3 F (36.8 C)  TempSrc: Skin  SpO2: 96%  Weight: 249 lb 3.2 oz (113 kg)    Body mass index is 39.03 kg/m.  Physical Exam Vitals reviewed.  Constitutional:      General: She is not in acute distress.    Appearance: Normal appearance. She is obese. She is not ill-appearing.  HENT:     Head: Normocephalic and atraumatic.  Cardiovascular:     Rate and Rhythm: Normal rate and regular rhythm.     Heart sounds: Normal heart sounds.  Pulmonary:     Effort: Pulmonary effort is normal.     Breath sounds: Normal  breath sounds.  Abdominal:     General: Bowel sounds are normal.     Palpations: Abdomen is soft. There is no mass.     Tenderness: There is abdominal tenderness (superior to her umbilicus. no hernia appreciated with valsalva. does protrude slightly but unsure if i would call true mass. ). There is no guarding or rebound.  Neurological:     General: No focal deficit present.     Mental Status: She is alert and oriented to person, place, and time.  Psychiatric:        Mood and Affect: Mood normal.        Behavior: Behavior normal.     Comments: Tearful when talking about her anxiety. No si/hi/ah/vh     GAD 7 : Generalized Anxiety Score 07/30/2019  Nervous, Anxious, on Edge 2  Control/stop worrying 3  Worry too much - different things 3  Trouble relaxing 1  Restless 0  Easily annoyed or irritable 3  Afraid - awful might happen 3  Total GAD 7 Score 15  Anxiety Difficulty Very difficult        Assessment/plan: 1. GAD (generalized anxiety disorder) Has had for much of her adult life and even as a child. Some of this anxiety is invoked from past acute illness experiences. It is affecting her marriage and her life and is consuming her. Her GAD7 score is severe. Discussed she is already exercising and could add on some yoga. Gave her information for counselor and we are going to start her on medication as well to help with coping. Starting low dose prozac. discussed how to take this with her thyroid medication. I've explained to her that drugs of the SSRI class can have side effects such as weight gain, sexual dysfunction, insomnia, headache, nausea. These medications are generally effective at alleviating symptoms of anxiety and/or depression. Let me know if significant side effects do occur. Any si/hi she is to call 911 or go to ER. Will see her back in 1 month for f/u of this.   2. Abdominal mass, unspecified abdominal location Could be a hernia, but do not really appreciate on exam. ? If  muscle strain as well. Will give her 2 weeks to do conservative therapy with rest, heating pad, tylenol prn. If not getting better we can scan her and also r/o hiatal hernia with her belching symptoms. She will email me and let me know. Precautions given for strangulation symptoms.   3. Belching/gassiness -fodmop diet. Can try gasx but I tend to think these are expensive with no eBm behind them. Could be from her accelerated weight loss or food since only been a  Few days. Can f/u in one month on this as well.   This visit occurred during the SARS-CoV-2 public health emergency.  Safety protocols were in place, including screening questions prior to the visit, additional usage of staff PPE, and extensive cleaning of  exam room while observing appropriate contact time as indicated for disinfecting solutions.     Return in about 1 month (around 08/30/2019) for anxiety f/u .     Orma Flaming, MD [place]  07/30/2019

## 2019-08-13 ENCOUNTER — Encounter: Payer: Self-pay | Admitting: Family Medicine

## 2019-08-14 ENCOUNTER — Other Ambulatory Visit: Payer: Self-pay | Admitting: Family Medicine

## 2019-08-14 DIAGNOSIS — R1033 Periumbilical pain: Secondary | ICD-10-CM

## 2019-08-25 ENCOUNTER — Other Ambulatory Visit: Payer: Self-pay

## 2019-08-26 ENCOUNTER — Other Ambulatory Visit (INDEPENDENT_AMBULATORY_CARE_PROVIDER_SITE_OTHER): Payer: Managed Care, Other (non HMO)

## 2019-08-26 DIAGNOSIS — R1033 Periumbilical pain: Secondary | ICD-10-CM

## 2019-08-26 LAB — BASIC METABOLIC PANEL
BUN: 12 mg/dL (ref 6–23)
CO2: 28 mEq/L (ref 19–32)
Calcium: 9.4 mg/dL (ref 8.4–10.5)
Chloride: 104 mEq/L (ref 96–112)
Creatinine, Ser: 0.74 mg/dL (ref 0.40–1.20)
GFR: 94.89 mL/min (ref >60.00)
Glucose, Bld: 101 mg/dL — ABNORMAL HIGH (ref 70–99)
Potassium: 4.5 mEq/L (ref 3.5–5.1)
Sodium: 137 mEq/L (ref 135–145)

## 2019-09-01 ENCOUNTER — Other Ambulatory Visit: Payer: Self-pay

## 2019-09-01 ENCOUNTER — Ambulatory Visit (INDEPENDENT_AMBULATORY_CARE_PROVIDER_SITE_OTHER)
Admission: RE | Admit: 2019-09-01 | Discharge: 2019-09-01 | Disposition: A | Discharge status: Home / Self Care | Payer: Managed Care, Other (non HMO) | Source: Ambulatory Visit | Attending: Family Medicine | Admitting: Family Medicine

## 2019-09-01 DIAGNOSIS — R1033 Periumbilical pain: Secondary | ICD-10-CM

## 2019-09-01 MED ORDER — IOHEXOL 300 MG/ML  SOLN
100.0000 mL | Freq: Once | INTRAMUSCULAR | Status: AC | PRN
  Administered 2019-09-01: 14:00:00 100 mL via INTRAVENOUS

## 2019-09-02 ENCOUNTER — Encounter: Payer: Self-pay | Admitting: Family Medicine

## 2019-10-12 ENCOUNTER — Other Ambulatory Visit: Payer: Self-pay

## 2019-10-12 ENCOUNTER — Other Ambulatory Visit: Payer: Managed Care, Other (non HMO)

## 2019-10-12 ENCOUNTER — Telehealth: Payer: Self-pay | Admitting: Family Medicine

## 2019-10-12 DIAGNOSIS — E039 Hypothyroidism, unspecified: Secondary | ICD-10-CM

## 2019-10-12 NOTE — Telephone Encounter (Signed)
Patient called in wanting to schedule an appointment for lab work, so she can retest her tyroid levels, and I didn't see any orders in the system. Is patient okay to come Monday?

## 2019-10-12 NOTE — Telephone Encounter (Signed)
Orders put in.  Orma Flaming, MD [place]

## 2019-10-13 ENCOUNTER — Other Ambulatory Visit: Payer: Managed Care, Other (non HMO)

## 2019-10-15 ENCOUNTER — Other Ambulatory Visit: Payer: Self-pay

## 2019-10-15 DIAGNOSIS — E039 Hypothyroidism, unspecified: Secondary | ICD-10-CM

## 2019-10-19 ENCOUNTER — Other Ambulatory Visit: Payer: Managed Care, Other (non HMO)

## 2019-10-20 ENCOUNTER — Other Ambulatory Visit: Payer: Managed Care, Other (non HMO)

## 2019-10-22 DIAGNOSIS — R102 Pelvic and perineal pain: Secondary | ICD-10-CM | POA: Insufficient documentation

## 2019-10-29 ENCOUNTER — Telehealth: Payer: Self-pay

## 2019-10-29 NOTE — Telephone Encounter (Signed)
Patient scheduled appt for labs 10/30/19 patient would like to know could she also have STD blood draw on the same visit.

## 2019-10-29 NOTE — Telephone Encounter (Signed)
There is no message.  Orma Flaming, MD [place]

## 2019-10-29 NOTE — Telephone Encounter (Signed)
Please attach message.   Thank You

## 2019-10-30 ENCOUNTER — Other Ambulatory Visit (INDEPENDENT_AMBULATORY_CARE_PROVIDER_SITE_OTHER): Payer: Managed Care, Other (non HMO)

## 2019-10-30 ENCOUNTER — Other Ambulatory Visit: Payer: Self-pay

## 2019-10-30 DIAGNOSIS — E039 Hypothyroidism, unspecified: Secondary | ICD-10-CM

## 2019-10-30 LAB — T4, FREE: Free T4: 0.69 ng/dL (ref 0.60–1.60)

## 2019-10-30 LAB — TSH: TSH: 8.18 u[IU]/mL — ABNORMAL HIGH (ref 0.35–4.50)

## 2019-11-02 ENCOUNTER — Other Ambulatory Visit: Payer: Self-pay | Admitting: Family Medicine

## 2019-11-02 DIAGNOSIS — E039 Hypothyroidism, unspecified: Secondary | ICD-10-CM

## 2019-11-02 MED ORDER — LEVOTHYROXINE SODIUM 75 MCG PO TABS: 75.0000 ug | ORAL_TABLET | Freq: Every day | ORAL | 0 refills | Status: DC

## 2019-12-04 ENCOUNTER — Encounter: Payer: Self-pay | Admitting: Family Medicine

## 2019-12-04 ENCOUNTER — Other Ambulatory Visit: Payer: Self-pay | Admitting: *Deleted

## 2019-12-04 MED ORDER — LEVOTHYROXINE SODIUM 75 MCG PO TABS: 75.0000 ug | ORAL_TABLET | Freq: Every day | ORAL | 0 refills | Status: DC

## 2019-12-04 NOTE — Telephone Encounter (Signed)
Rx [facility] to [facility] [address] [place] [id number]

## 2020-01-07 ENCOUNTER — Ambulatory Visit: Payer: Managed Care, Other (non HMO) | Admitting: Family Medicine

## 2020-01-07 ENCOUNTER — Encounter: Payer: Self-pay | Admitting: Family Medicine

## 2020-01-08 ENCOUNTER — Other Ambulatory Visit: Payer: Self-pay

## 2020-01-08 ENCOUNTER — Telehealth: Payer: Self-pay

## 2020-01-08 ENCOUNTER — Encounter: Payer: Self-pay | Admitting: Family Medicine

## 2020-01-08 ENCOUNTER — Ambulatory Visit (INDEPENDENT_AMBULATORY_CARE_PROVIDER_SITE_OTHER): Payer: Managed Care, Other (non HMO) | Admitting: Family Medicine

## 2020-01-08 VITALS — BP 126/74 | HR 83 | Temp 97.6°F | Ht 67.0 in | Wt 254.4 lb

## 2020-01-08 DIAGNOSIS — H6981 Other specified disorders of Eustachian tube, right ear: Secondary | ICD-10-CM

## 2020-01-08 DIAGNOSIS — L989 Disorder of the skin and subcutaneous tissue, unspecified: Secondary | ICD-10-CM | POA: Diagnosis not present

## 2020-01-08 DIAGNOSIS — E039 Hypothyroidism, unspecified: Secondary | ICD-10-CM | POA: Diagnosis not present

## 2020-01-08 LAB — TSH: TSH: 7.27 u[IU]/mL — ABNORMAL HIGH (ref 0.35–4.50)

## 2020-01-08 LAB — T4, FREE: Free T4: 0.79 ng/dL (ref 0.60–1.60)

## 2020-01-08 MED ORDER — MUPIROCIN 2 % EX OINT: 1.0000 "application " | TOPICAL_OINTMENT | Freq: Three times a day (TID) | CUTANEOUS | 0 refills | Status: DC

## 2020-01-08 MED ORDER — FLUTICASONE PROPIONATE 50 MCG/ACT NA SUSP
2.0000 | Freq: Every day | NASAL | 6 refills | Status: DC
Start: 2020-01-08 — End: 2020-01-08

## 2020-01-08 MED ORDER — FLUTICASONE PROPIONATE 50 MCG/ACT NA SUSP: 2.0000 | Freq: Every day | NASAL | 6 refills | Status: DC

## 2020-01-08 NOTE — Telephone Encounter (Signed)
Re-sent to pharmacy.

## 2020-01-08 NOTE — Progress Notes (Signed)
Patient: [name] [name] MRN: [id number] DOB: Aug 19, 1995 PCP: Orma Flaming, MD     Subjective:  Chief Complaint  Patient presents with  . Ear Fullness    Pt says yeaterday her ears felt full, and she had some discomfort. Today the discomfort has gone , but they still feel full.    HPI: The patient is a 24 y.o. female who presents today for ear fullness and discomfort. Starting yesterday ears were full and then became sore, but today she no longer feels discomfort. She is not using any otc medication. No fever/chills. No drainage from the ear. No ringing in the ear. She does have history of mastoiditis in same ear.   Due for thyroid lab recheck after medication adjustment.   Has a crack on right side of her mouth at angle. Comes a few times a month. Has tried carmex with no relief.    Review of Systems  Constitutional: Negative for chills and fever.  HENT: Positive for ear pain and mouth sores. Negative for congestion, ear discharge, hearing loss, postnasal drip, rhinorrhea, sore throat and tinnitus.   Respiratory: Negative for cough and shortness of breath.   Gastrointestinal: Negative for abdominal pain, nausea and vomiting.  Musculoskeletal: Negative for neck pain and neck stiffness.  Neurological: Negative for dizziness, light-headedness and headaches.    Allergies Patient is allergic to vancomycin.  Past Medical History Patient  has a past medical history of Anemia, Fibroadenoma of left breast, Fibroid, Headache, History of chicken pox, Medical history non-contributory, Streptococcus B carrier or suspected carrier, and Thyroid disease.  Surgical History Patient  has a past surgical history that includes No past surgeries.  Family History Pateint's family history includes Bladder Cancer in her paternal grandmother; Breast cancer in her maternal grandmother; COPD in her maternal grandmother; Diabetes in her maternal grandmother; Healthy in her mother; Hyperlipidemia in her  father; Hypertension in her father; Mental retardation in her brother.  Social History Patient  reports that she quit smoking about 9 years ago. She has never used smokeless tobacco. She reports that she does not drink alcohol or use drugs.    Objective: Vitals:   01/08/20 1119  BP: 126/74  Pulse: 83  Temp: 97.6 F (36.4 C)  TempSrc: Temporal  SpO2: 99%  Weight: 254 lb 6.4 oz (115.4 kg)  Height: 5\' 7"  (1.702 m)    Body mass index is 39.84 kg/m.  Physical Exam Vitals reviewed.  Constitutional:      Appearance: Normal appearance. She is obese.  HENT:     Head: Normocephalic and atraumatic.     Right Ear: Tympanic membrane, ear canal and external ear normal.     Left Ear: Tympanic membrane and external ear normal.     Nose: Nose normal.     Mouth/Throat:     Mouth: Mucous membranes are moist.  Eyes:     Extraocular Movements: Extraocular movements intact.     Pupils: Pupils are equal, round, and reactive to light.  Cardiovascular:     Rate and Rhythm: Normal rate and regular rhythm.     Heart sounds: Normal heart sounds.  Pulmonary:     Effort: Pulmonary effort is normal.     Breath sounds: Normal breath sounds.  Abdominal:     General: Bowel sounds are normal.     Palpations: Abdomen is soft.  Musculoskeletal:     Cervical back: Normal range of motion and neck supple.  Lymphadenopathy:     Cervical: No cervical adenopathy.  Skin:  General: Skin is warm.     Comments: Crack on right oral commissure   Neurological:     General: No focal deficit present.     Mental Status: She is alert and oriented to person, place, and time.        Assessment/plan: 1. Eustachian tube dysfunction, right Start flonase daily. Discussed no signs of infection and reassured her. No fluid even in her right ear. If not getting better she will let me know and we can trial some oral steroids.   2. Acquired hypothyroidism  - TSH - T4, free  3. Skin abnormality Trial of bactroban  Bid-Tid to right oral commissure. If fails, we can try anti fungal.    This visit occurred during the SARS-CoV-2 public health emergency.  Safety protocols were in place, including screening questions prior to the visit, additional usage of staff PPE, and extensive cleaning of exam room while observing appropriate contact time as indicated for disinfecting solutions.     Return if symptoms worsen or fail to improve.   Orma Flaming, MD [place]   01/08/2020

## 2020-01-08 NOTE — Patient Instructions (Signed)
Start flonase daily. Will help with fluid drainage. Let me know if not getting better on Monday!   Checking thyroid labs today   Eustachian Tube Dysfunction  Eustachian tube dysfunction refers to a condition in which a blockage develops in the narrow passage that connects the middle ear to the back of the nose (eustachian tube). The eustachian tube regulates air pressure in the middle ear by letting air move between the ear and nose. It also helps to drain fluid from the middle ear space. Eustachian tube dysfunction can affect one or both ears. When the eustachian tube does not function properly, air pressure, fluid, or both can build up in the middle ear. What are the causes? This condition occurs when the eustachian tube becomes blocked or cannot open normally. Common causes of this condition include:  Ear infections.  Colds and other infections that affect the nose, mouth, and throat (upper respiratory tract).  Allergies.  Irritation from cigarette smoke.  Irritation from stomach acid coming up into the esophagus (gastroesophageal reflux). The esophagus is the tube that carries food from the mouth to the stomach.  Sudden changes in air pressure, such as from descending in an airplane or scuba diving.  Abnormal growths in the nose or throat, such as: ? Growths that line the nose (nasal polyps). ? Abnormal growth of cells (tumors). ? Enlarged tissue at the back of the throat (adenoids). What increases the risk? You are more likely to develop this condition if:  You smoke.  You are overweight.  You are a child who has: ? Certain birth defects of the mouth, such as cleft palate. ? Large tonsils or adenoids. What are the signs or symptoms? Common symptoms of this condition include:  A feeling of fullness in the ear.  Ear pain.  Clicking or popping noises in the ear.  Ringing in the ear.  Hearing loss.  Loss of balance.  Dizziness. Symptoms may get worse when the air  pressure around you changes, such as when you travel to an area of high elevation, fly on an airplane, or go scuba diving. How is this diagnosed? This condition may be diagnosed based on:  Your symptoms.  A physical exam of your ears, nose, and throat.  Tests, such as those that measure: ? The movement of your eardrum (tympanogram). ? Your hearing (audiometry). How is this treated? Treatment depends on the cause and severity of your condition.  In mild cases, you may relieve your symptoms by moving air into your ears. This is called "popping the ears."  In more severe cases, or if you have symptoms of fluid in your ears, treatment may include: ? Medicines to relieve congestion (decongestants). ? Medicines that treat allergies (antihistamines). ? Nasal sprays or ear drops that contain medicines that reduce swelling (steroids). ? A procedure to drain the fluid in your eardrum (myringotomy). In this procedure, a small tube is placed in the eardrum to:  Drain the fluid.  Restore the air in the middle ear space. ? A procedure to insert a balloon device through the nose to inflate the opening of the eustachian tube (balloon dilation). Follow these instructions at home: Lifestyle  Do not do any of the following until your health care provider approves: ? Travel to high altitudes. ? Fly in airplanes. ? Work in a Pension scheme manager or room. ? Scuba dive.  Do not use any products that contain nicotine or tobacco, such as cigarettes and e-cigarettes. If you need help quitting, ask your  health care provider.  Keep your ears dry. Wear fitted earplugs during showering and bathing. Dry your ears completely after. General instructions  Take over-the-counter and prescription medicines only as told by your health care provider.  Use techniques to help pop your ears as recommended by your health care provider. These may include: ? Chewing gum. ? Yawning. ? Frequent, forceful  swallowing. ? Closing your mouth, holding your nose closed, and gently blowing as if you are trying to blow air out of your nose.  Keep all follow-up visits as told by your health care provider. This is important. Contact a health care provider if:  Your symptoms do not go away after treatment.  Your symptoms come back after treatment.  You are unable to pop your ears.  You have: ? A fever. ? Pain in your ear. ? Pain in your head or neck. ? Fluid draining from your ear.  Your hearing suddenly changes.  You become very dizzy.  You lose your balance. Summary  Eustachian tube dysfunction refers to a condition in which a blockage develops in the eustachian tube.  It can be caused by ear infections, allergies, inhaled irritants, or abnormal growths in the nose or throat.  Symptoms include ear pain, hearing loss, or ringing in the ears.  Mild cases are treated with maneuvers to unblock the ears, such as yawning or ear popping.  Severe cases are treated with medicines. Surgery may also be done (rare). This information is not intended to replace advice given to you by your health care provider. Make sure you discuss any questions you have with your health care provider. Document Revised: 11/12/2017 Document Reviewed: 11/12/2017 Elsevier Patient Education  [place].

## 2020-01-08 NOTE — Telephone Encounter (Signed)
Patient calling and states that the medications that were sent in from today were sent to the wrong pharmacy. Would like the following medications sent to the pharmacy below. Please advise.   [id number] - [place], [place] - [address]  fluticasone (FLONASE) 50 MCG/ACT nasal spray  mupirocin ointment (BACTROBAN) 2 %

## 2020-01-11 ENCOUNTER — Other Ambulatory Visit: Payer: Self-pay | Admitting: Family Medicine

## 2020-01-11 DIAGNOSIS — E039 Hypothyroidism, unspecified: Secondary | ICD-10-CM

## 2020-01-11 MED ORDER — LEVOTHYROXINE SODIUM 125 MCG PO TABS
125.0000 ug | ORAL_TABLET | Freq: Every day | ORAL | 0 refills | Status: DC
Start: 2020-01-11 — End: 2020-06-20

## 2020-01-18 ENCOUNTER — Encounter: Payer: Self-pay | Admitting: Family Medicine

## 2020-02-02 ENCOUNTER — Other Ambulatory Visit: Payer: Self-pay

## 2020-02-02 ENCOUNTER — Encounter (HOSPITAL_COMMUNITY): Payer: Self-pay

## 2020-02-02 ENCOUNTER — Emergency Department (HOSPITAL_COMMUNITY): Payer: Managed Care, Other (non HMO)

## 2020-02-02 ENCOUNTER — Emergency Department (HOSPITAL_COMMUNITY)
Admission: EM | Admit: 2020-02-02 | Discharge: 2020-02-03 | Disposition: A | Discharge status: Home / Self Care | Payer: Managed Care, Other (non HMO) | Attending: Emergency Medicine | Admitting: Emergency Medicine

## 2020-02-02 ENCOUNTER — Encounter: Payer: Self-pay | Admitting: Family Medicine

## 2020-02-02 ENCOUNTER — Telehealth (INDEPENDENT_AMBULATORY_CARE_PROVIDER_SITE_OTHER): Payer: Managed Care, Other (non HMO) | Admitting: Family Medicine

## 2020-02-02 DIAGNOSIS — R519 Headache, unspecified: Secondary | ICD-10-CM | POA: Insufficient documentation

## 2020-02-02 DIAGNOSIS — R0981 Nasal congestion: Secondary | ICD-10-CM

## 2020-02-02 DIAGNOSIS — H9201 Otalgia, right ear: Secondary | ICD-10-CM | POA: Diagnosis present

## 2020-02-02 DIAGNOSIS — Z87891 Personal history of nicotine dependence: Secondary | ICD-10-CM | POA: Insufficient documentation

## 2020-02-02 DIAGNOSIS — J019 Acute sinusitis, unspecified: Secondary | ICD-10-CM

## 2020-02-02 LAB — CBC WITH DIFFERENTIAL/PLATELET
Abs Immature Granulocytes: 0.04 10*3/uL (ref 0.00–0.07)
Basophils Absolute: 0.1 10*3/uL (ref 0.0–0.1)
Basophils Relative: 1 %
Eosinophils Absolute: 0.1 10*3/uL (ref 0.0–0.5)
Eosinophils Relative: 1 %
HCT: 45.4 % (ref 36.0–46.0)
Hemoglobin: 14.6 g/dL (ref 12.0–15.0)
Immature Granulocytes: 0 %
Lymphocytes Relative: 26 %
Lymphs Abs: 2.5 10*3/uL (ref 0.7–4.0)
MCH: 28.9 pg (ref 26.0–34.0)
MCHC: 32.2 g/dL (ref 30.0–36.0)
MCV: 89.7 fL (ref 80.0–100.0)
Monocytes Absolute: 0.6 10*3/uL (ref 0.1–1.0)
Monocytes Relative: 6 %
Neutro Abs: 6.3 10*3/uL (ref 1.7–7.7)
Neutrophils Relative %: 66 %
Platelets: 345 10*3/uL (ref 150–400)
RBC: 5.06 MIL/uL (ref 3.87–5.11)
RDW: 12.9 % (ref 11.5–15.5)
WBC: 9.6 10*3/uL (ref 4.0–10.5)
nRBC: 0 % (ref 0.0–0.2)

## 2020-02-02 LAB — COMPREHENSIVE METABOLIC PANEL
ALT: 33 U/L (ref 0–44)
AST: 23 U/L (ref 15–41)
Albumin: 4.6 g/dL (ref 3.5–5.0)
Alkaline Phosphatase: 59 U/L (ref 38–126)
Anion gap: 10 (ref 5–15)
BUN: 8 mg/dL (ref 6–20)
CO2: 25 mmol/L (ref 22–32)
Calcium: 9.6 mg/dL (ref 8.9–10.3)
Chloride: 104 mmol/L (ref 98–111)
Creatinine, Ser: 0.76 mg/dL (ref 0.44–1.00)
GFR calc Af Amer: >60 mL/min (ref >60)
GFR calc non Af Amer: >60 mL/min (ref >60)
Glucose, Bld: 104 mg/dL — ABNORMAL HIGH (ref 70–99)
Potassium: 4.4 mmol/L (ref 3.5–5.1)
Sodium: 139 mmol/L (ref 135–145)
Total Bilirubin: 0.7 mg/dL (ref 0.3–1.2)
Total Protein: 7.9 g/dL (ref 6.5–8.1)

## 2020-02-02 MED ORDER — HYDROMORPHONE HCL 1 MG/ML IJ SOLN
0.5000 mg | Freq: Once | INTRAMUSCULAR | Status: AC
  Administered 2020-02-02: 0.5 mg via INTRAVENOUS
  Filled 2020-02-02: qty 1

## 2020-02-02 MED ORDER — IBUPROFEN 400 MG PO TABS
400.0000 mg | ORAL_TABLET | Freq: Once | ORAL | Status: AC | PRN
  Administered 2020-02-02: 400 mg via ORAL
  Filled 2020-02-02: qty 1

## 2020-02-02 MED ORDER — OXYCODONE-ACETAMINOPHEN 5-325 MG PO TABS
1.0000 | ORAL_TABLET | Freq: Once | ORAL | Status: AC
  Administered 2020-02-02: 1 via ORAL
  Filled 2020-02-02: qty 1

## 2020-02-02 MED ORDER — IOHEXOL 300 MG/ML  SOLN
75.0000 mL | Freq: Once | INTRAMUSCULAR | Status: AC | PRN
  Administered 2020-02-02: 75 mL via INTRAVENOUS

## 2020-02-02 MED ORDER — MORPHINE SULFATE (PF) 4 MG/ML IV SOLN
4.0000 mg | Freq: Once | INTRAVENOUS | Status: AC
  Administered 2020-02-03: 4 mg via INTRAVENOUS
  Filled 2020-02-02: qty 1

## 2020-02-02 NOTE — ED Provider Notes (Signed)
[place] [facility] DEPART[facility] Provider Note   CSN: [id number] Arrival date & time: 02/02/20  1745     History Chief Complaint  Patient presents with  . Otalgia    [name] [name] is a 24 y.o. female.  Presents to ER with concern for ear pain.  Patient states that she has had pain on the right side of her face, here for the past 2 or 3 days.  Initially noted some mild sinus pressure on the right side, states today that symptoms are getting progressively worse.  Now having 10 out of 10 headache, right ear pain, right facial pain.  Reports that she was diagnosed with mastoiditis 1 year ago and this feels similar.  No fevers but does have chills.  No pain with eye movement.  HPI     Past Medical History:  Diagnosis Date  . Anemia   . Fibroadenoma of left breast    needs follow up 1/20  . Fibroid   . Headache   . History of chicken pox   . Medical history non-contributory   . Streptococcus B carrier or suspected carrier   . Thyroid disease     Patient Active Problem List   Diagnosis Date Noted  . Pelvic pain in female 10/22/2019  . Acquired hypothyroidism 01/01/2019  . Mastoiditis 11/13/2018  . Chronic migraine 12/13/2015  . Low back pain 12/13/2015  . Morbid obesity ([place]) 11/01/2015    Past Surgical History:  Procedure Laterality Date  . NO PAST SURGERIES       OB History    Gravida  2   Para  2   Term  2   Preterm  0   AB  0   Living  2     SAB  0   TAB  0   Ectopic  0   Multiple  0   Live Births  2           Family History  Problem Relation Age of Onset  . Healthy Mother   . Hypertension Father   . Hyperlipidemia Father   . Mental retardation Brother   . COPD Maternal Grandmother   . Breast cancer Maternal Grandmother   . Diabetes Maternal Grandmother   . Bladder Cancer Paternal Grandmother     Social History   Tobacco Use  . Smoking status: Former Smoker    Quit date: 10/25/2010    Years since quitting:  9.2  . Smokeless tobacco: Never Used  Vaping Use  . Vaping Use: Never used  Substance Use Topics  . Alcohol use: No    Comment: socially  . Drug use: No    Home Medications Prior to Admission medications   Medication Sig Start Date End Date Taking? Authorizing Provider  acetaminophen (TYLENOL) 325 MG tablet Take 650 mg by mouth every 6 (six) hours as needed for mild pain or fever.    [provider]  amoxicillin-clavulanate (AUGMENTIN) 875-125 MG tablet Take 1 tablet by mouth every 12 (twelve) hours. 02/03/20   Lucrezia Starch, MD  HYDROcodone-acetaminophen (NORCO) 5-325 MG tablet Take 1 tablet by mouth every 6 (six) hours as needed for up to 3 days for moderate pain or severe pain. 02/03/20 02/06/20  Lucrezia Starch, MD  ketorolac (TORADOL) 10 MG tablet Take 1 tablet (10 mg total) by mouth every 6 (six) hours as needed. 02/03/20   Lucrezia Starch, MD  levothyroxine (SYNTHROID) 125 MCG tablet Take 1 tablet (125 mcg total)  by mouth daily. Repeat labs in 6-8 weeks 01/11/20   Orma Flaming, MD    Allergies    Vancomycin  Review of Systems   Review of Systems  Constitutional: Negative for chills and fever.  HENT: Positive for ear pain, sinus pressure and sinus pain. Negative for sore throat.   Eyes: Negative for pain and visual disturbance.  Respiratory: Negative for cough and shortness of breath.   Cardiovascular: Negative for chest pain and palpitations.  Gastrointestinal: Negative for abdominal pain and vomiting.  Genitourinary: Negative for dysuria and hematuria.  Musculoskeletal: Negative for arthralgias and back pain.  Skin: Negative for color change and rash.  Neurological: Negative for seizures and syncope.  All other systems reviewed and are negative.   Physical Exam Updated Vital Signs BP 124/88 (BP Location: Right Arm)   Pulse 68   Temp 98.5 F (36.9 C) (Oral)   Resp 18   Ht 5\' 8"  (1.727 m)   Wt 113.4 kg   LMP 01/22/2020 (Exact Date)   SpO2 100%    BMI 38.01 kg/m   Physical Exam Vitals and nursing note reviewed.  Constitutional:      General: She is not in acute distress.    Appearance: She is well-developed.  HENT:     Head: Normocephalic and atraumatic.     Ears:     Comments: Left ear: No tenderness over mastoid process, normal-appearing external ear, clear TM Right ear: Mild middle ear effusion noted behind TM, no bulging, there is some tenderness over the mastoid process, no frank erythema or swelling noted Eyes:     Extraocular Movements: Extraocular movements intact.     Conjunctiva/sclera: Conjunctivae normal.     Pupils: Pupils are equal, round, and reactive to light.  Cardiovascular:     Rate and Rhythm: Normal rate and regular rhythm.     Heart sounds: No murmur heard.   Pulmonary:     Effort: Pulmonary effort is normal. No respiratory distress.     Breath sounds: Normal breath sounds.  Abdominal:     Palpations: Abdomen is soft.     Tenderness: There is no abdominal tenderness.  Musculoskeletal:     Cervical back: Neck supple.  Skin:    General: Skin is warm and dry.  Neurological:     Mental Status: She is alert.     ED Results / Procedures / Treatments   Labs (all labs ordered are listed, but only abnormal results are displayed) Labs Reviewed  COMPREHENSIVE METABOLIC PANEL - Abnormal; Notable for the following components:      Result Value   Glucose, Bld 104 (*)    All other components within normal limits  CBC WITH DIFFERENTIAL/PLATELET    EKG None  Radiology CT Head Wo Contrast  Result Date: 02/02/2020 CLINICAL DATA:  Headache EXAM: CT HEAD WITHOUT CONTRAST TECHNIQUE: Contiguous axial images were obtained from the base of the skull through the vertex without intravenous contrast. COMPARISON:  None. FINDINGS: Brain: No acute intracranial abnormality. Specifically, no hemorrhage, hydrocephalus, mass lesion, acute infarction, or significant intracranial injury. Vascular: No hyperdense vessel or  unexpected calcification. Skull: No acute calvarial abnormality. Sinuses/Orbits: Air-fluid level in the right maxillary sinus. Opacified right ethmoid air cells. Other: None IMPRESSION: No intracranial abnormality. Acute right maxillary sinusitis. Electronically Signed   By: Rolm Baptise M.D.   On: 02/02/2020 23:20   CT Temporal Bones W Contrast  Result Date: 02/03/2020 CLINICAL DATA:  Initial evaluation for acute severe right ear pain, headache, concern for  mastoiditis. EXAM: CT TEMPORAL BONES WITH CONTRAST TECHNIQUE: Axial and coronal plane CT imaging of the petrous temporal bones was performed with thin-collimation image reconstruction after intravenous contrast administration. Multiplanar CT image reconstructions were also generated. CONTRAST:  36mL OMNIPAQUE IOHEXOL 300 MG/ML  SOLN COMPARISON:  None. FINDINGS: RIGHT TEMPORAL BONE FINDINGS: The auricle and pinna are within normal limits. No pre or postauricular soft tissue swelling or inflammatory changes. Right EAC is clear. Tympanic membrane is thin. Middle ear cavity is well pneumatized and clear. Tegmen tympani intact. Ossicular chain normally formed and intact. Mastoid air cells are clear. No coalescence. Sigmoid plate intact. Normal opacification seen within the right transverse and sigmoid sinuses as well as the right jugular bulb. Internal auditory canal within normal limits. Inner ear structures including the vestibule, cochlea, and semi circular canals are within normal limits. Facial nerve canal intact and bony covered. No abnormality at the stylomastoid foramen. Vestibular aqueduct within normal limits. Carotid canal intact and within normal limits. Jugular bulb within normal limits. LEFT TEMPORAL BONE FINDINGS: The auricle and pinna are within normal limits. No pre or postauricular soft tissue swelling or inflammatory changes. Left EAC is clear. Tympanic membrane is thin. Middle ear cavity is well pneumatized and clear. Tegmen tympani intact.  Ossicular chain normally formed and intact. Mastoid air cells are clear. No coalescence. Sigmoid plate intact. Normal opacification seen within the right transverse and sigmoid sinuses as well as the right jugular bulb. Internal auditory canal within normal limits. Inner ear structures including the vestibule, cochlea, and semi circular canals are within normal limits. Facial nerve canal intact and bony covered. No abnormality at the stylomastoid foramen. Vestibular aqueduct within normal limits. Carotid canal intact and within normal limits. Jugular bulb within normal limits. Visualized intracranial contents within normal limits. Globes and orbital soft tissues unremarkable. Scattered mucoperiosteal thickening present throughout the right frontal and ethmoidal air cells as well as the right greater than left sphenoid sinuses. Mucosal thickening with air-fluid level noted within the right maxillary sinus, suggesting acute sinusitis. IMPRESSION: 1. Normal temporal bone CT. No evidence for acute otomastoiditis or other abnormality. 2. Acute right-sided paranasal sinus disease as above. Electronically Signed   By: Jeannine Boga M.D.   On: 02/03/2020 00:03    Procedures Procedures (including critical care time)  Medications Ordered in ED Medications  ibuprofen (ADVIL) tablet 400 mg (400 mg Oral Given 02/02/20 2012)  HYDROmorphone (DILAUDID) injection 0.5 mg (0.5 mg Intravenous Given 02/02/20 2118)  oxyCODONE-acetaminophen (PERCOCET/ROXICET) 5-325 MG per tablet 1 tablet (1 tablet Oral Given 02/02/20 2302)  iohexol (OMNIPAQUE) 300 MG/ML solution 75 mL (75 mLs Intravenous Contrast Given 02/02/20 2304)  morphine 4 MG/ML injection 4 mg (4 mg Intravenous Given 02/03/20 0009)  amoxicillin-clavulanate (AUGMENTIN) 875-125 MG per tablet 1 tablet (1 tablet Oral Given 02/03/20 0059)  ketorolac (TORADOL) 15 MG/ML injection 15 mg (15 mg Intravenous Given 02/03/20 0058)    ED Course  I have reviewed the triage vital  signs and the nursing notes.  Pertinent labs & imaging results that were available during my care of the patient were reviewed by me and considered in my medical decision making (see chart for details).    MDM Rules/Calculators/A&P                          24 y/o lady came to ER with right facial pain, ear pain. Has history of mastoiditis. Sent to ER to r/o by PCP. On exam, well appearing,  she had some TTP over her mastoid but no swelling or erythema noted.  CT ordered to further investigate. Negative for acute mastoidits but did demonstrate acute sinusitis. On reassessment, patient remains well appearing, pain well controlled. Will start course of Augmentin and recommended recheck with PCP. Believe she is appropriate for d/c given current clinical assessment.     After the discussed management above, the patient was determined to be safe for discharge.  The patient was in agreement with this plan and all questions regarding their care were answered.  ED return precautions were discussed and the patient will return to the ED with any significant worsening of condition.   Final Clinical Impression(s) / ED Diagnoses Final diagnoses:  Acute sinusitis, recurrence not specified, unspecified location    Rx / DC Orders ED Discharge Orders         Ordered    ketorolac (TORADOL) 10 MG tablet  Every 6 hours PRN     Discontinue  Reprint     02/03/20 0023    HYDROcodone-acetaminophen (NORCO) 5-325 MG tablet  Every 6 hours PRN     Discontinue  Reprint     02/03/20 0023    amoxicillin-clavulanate (AUGMENTIN) 875-125 MG tablet  Every 12 hours     Discontinue  Reprint     02/03/20 0023           Lucrezia Starch, MD 02/03/20 1344

## 2020-02-02 NOTE — ED Triage Notes (Addendum)
Pt presents with pain behind her Right ear, jaw and to the right side of her face x3 days. PCP sent her here to rule out possible mastoiditis. Hx of the same last year

## 2020-02-02 NOTE — Progress Notes (Signed)
Virtual Visit via Video Note  I connected with [name] [name]  on 02/02/20 at  5:00 PM EDT by a video enabled telemedicine application and verified that I am speaking with the correct person using two identifiers.  Location patient: car, [place] Location provider:work or home office Persons participating in the virtual visit: patient, provider, husband  I discussed the limitations of evaluation and management by telemedicine and the availability of in person appointments. The patient expressed understanding and agreed to proceed.   HPI:  Acute visit for severe head pain: -this pain has been worsening rapidly today, started 3 days ag  -had some sinus issues for a few weeks earlier this month -symptoms include severe pain behind the R ear and entire R side of her face that she reports feels like the pain she had with mastoiditis last year, rapidly worsening today -reports now can only cry and lie around, and is developing nausea as well, has had some mild sinus congestion the last few days -went to [facility] today and had negative COVID test and was told was likely viral URI -however, she is getting rapidly worse and she is worried this is mastoiditis again - had to be admitted last year after delayed dx -daughter was sick about 1 week ago   ROS: See pertinent positives and negatives per HPI.  Past Medical History:  Diagnosis Date  . Anemia   . Fibroadenoma of left breast    needs follow up 1/20  . Fibroid   . Headache   . History of chicken pox   . Medical history non-contributory   . Streptococcus B carrier or suspected carrier   . Thyroid disease     Past Surgical History:  Procedure Laterality Date  . NO PAST SURGERIES      Family History  Problem Relation Age of Onset  . Healthy Mother   . Hypertension Father   . Hyperlipidemia Father   . Mental retardation Brother   . COPD Maternal Grandmother   . Breast cancer Maternal Grandmother   . Diabetes Maternal Grandmother   . Bladder  Cancer Paternal Grandmother     SOCIAL HX: see hpi   Current Outpatient Medications:  .  acetaminophen (TYLENOL) 325 MG tablet, Take 650 mg by mouth every 6 (six) hours as needed for mild pain or fever., Disp: , Rfl:  .  levothyroxine (SYNTHROID) 125 MCG tablet, Take 1 tablet (125 mcg total) by mouth daily. Repeat labs in 6-8 weeks, Disp: 90 tablet, Rfl: 0  EXAM:  VITALS per patient if applicable:  GENERAL: alert, appear in pain and is tearful  HEENT: atraumatic, conjunttiva clear, no obvious abnormalities on inspection of external nose and ears, reports she has TTP around the R mastoid and R side of her face  NECK: normal movements of the head and neck  LUNGS: on inspection no signs of respiratory distress, breathing rate appears normal, no obvious gross SOB, gasping or wheezing  CV: no obvious cyanosis  MS: moves all visible extremities without noticeable abnormality  PSYCH/NEURO: pleasant and cooperative, no obvious depression or anxiety, speech and thought processing grossly intact  ASSESSMENT AND PLAN:  Discussed the following assessment and plan:  Acute intractable headache, unspecified headache type  Facial pain  Sinus congestion  -we discussed possible serious and likely etiologies, options for evaluation and workup, limitations of telemedicine visit vs in person visit, treatment, treatment risks and precautions. Query VURI, sinusitis, OM vs OE vs other. She is very worried about recurrence of mastoiditis as  she reports severe pain, rapidly worsening and she reports if feels similar to the pain she had last year w/ mastoiditis requiring hospital admission at the time. Advised given the severity of her symptoms and her concerns it may be best for her to seek inperson care at the ER if she thinks symptoms have dramatically worsened since her [place] visit earlier today, as she would need imaging. She agreed and prefers to do that as she does feel symptoms have worsened a lot  since her [place] visit and it is too late for inperson evaluation at her PCP office. She agrees to go right away. Husband is driving her.   I discussed the assessment and treatment plan with the patient. The patient was provided an opportunity to ask questions and all were answered. The patient agreed with the plan and demonstrated an understanding of the instructions.   The patient was advised to call back or seek an in-person evaluation if the symptoms worsen or if the condition fails to improve as anticipated.   Lucretia Kern, DO

## 2020-02-03 MED ORDER — AMOXICILLIN-POT CLAVULANATE 875-125 MG PO TABS
1.0000 | ORAL_TABLET | Freq: Two times a day (BID) | ORAL | 0 refills | Status: DC
Start: 2020-02-03 — End: 2020-06-17

## 2020-02-03 MED ORDER — HYDROCODONE-ACETAMINOPHEN 5-325 MG PO TABS: 1.0000 | ORAL_TABLET | Freq: Four times a day (QID) | ORAL | 0 refills | Status: AC | PRN

## 2020-02-03 MED ORDER — KETOROLAC TROMETHAMINE 10 MG PO TABS: 10.0000 mg | ORAL_TABLET | Freq: Four times a day (QID) | ORAL | 0 refills | Status: DC | PRN

## 2020-02-03 MED ORDER — KETOROLAC TROMETHAMINE 15 MG/ML IJ SOLN
15.0000 mg | Freq: Once | INTRAMUSCULAR | Status: AC
  Administered 2020-02-03: 15 mg via INTRAVENOUS
  Filled 2020-02-03: qty 1

## 2020-02-03 MED ORDER — AMOXICILLIN-POT CLAVULANATE 875-125 MG PO TABS
1.0000 | ORAL_TABLET | Freq: Once | ORAL | Status: AC
  Administered 2020-02-03: 1 via ORAL
  Filled 2020-02-03: qty 1

## 2020-02-03 NOTE — ED Notes (Signed)
Pt verbalized understanding of d/c instructions, follow up acre and s/s requiring return to ed. Pt had no further questions and refused wheelchair. Pt ambulated to exit.

## 2020-02-03 NOTE — Discharge Instructions (Signed)
Please take antibiotic as prescribed. Can take the prescribed Toradol as needed as well as the prescribed Percocet as needed. Note the Percocet can make you drowsy shot be taken while driving or operating heavy machinery. Strongly recommend recheck with your primary doctor. If you feel your pain worsens, you develop fever, swelling, return to ER for reassessment.

## 2020-02-04 ENCOUNTER — Telehealth: Payer: Managed Care, Other (non HMO) | Admitting: Family Medicine

## 2020-02-11 ENCOUNTER — Telehealth: Payer: Self-pay | Admitting: Family Medicine

## 2020-02-11 NOTE — Telephone Encounter (Signed)
Please see message and advise 

## 2020-02-11 NOTE — Telephone Encounter (Signed)
°  LAST APPOINTMENT DATE: 01/08/2020   NEXT APPOINTMENT DATE:@Visit  date not found  MEDICATION:amoxicillin-clavulanate (AUGMENTIN) 875-125 MG tablet  PHARMACY: [facility] // [address], [place] [id number]  Comments: Patient states that she is almost out of the medication but still has the sinus issue and wanted to know if she can get a few more days worth to completely knock the sinus infection out.

## 2020-02-12 NOTE — Telephone Encounter (Signed)
Lvm for pt to call the office back. 

## 2020-02-12 NOTE — Telephone Encounter (Signed)
I spoke with the pt to give message below, she is not able to come into the office because she is out of town. She is complaining of a sinus infection, and says that she will just go to Urgent Care if it begins to get worse.

## 2020-02-12 NOTE — Telephone Encounter (Signed)
Let her know that I do not continue antibiotics beyond the 10 days. I can see her today to look at ears if she thinks she still has an infection and if so I usually will change antibiotics or make sure no referral to eNT is warranted.  Orma Flaming, MD [place]

## 2020-06-10 ENCOUNTER — Encounter: Payer: Managed Care, Other (non HMO) | Admitting: Family Medicine

## 2020-06-17 ENCOUNTER — Other Ambulatory Visit: Payer: Self-pay

## 2020-06-17 ENCOUNTER — Ambulatory Visit (INDEPENDENT_AMBULATORY_CARE_PROVIDER_SITE_OTHER): Payer: Self-pay | Admitting: Family Medicine

## 2020-06-17 ENCOUNTER — Encounter: Payer: Self-pay | Admitting: Family Medicine

## 2020-06-17 VITALS — BP 108/75 | HR 66 | Temp 97.3°F | Ht 68.0 in | Wt 256.0 lb

## 2020-06-17 DIAGNOSIS — E039 Hypothyroidism, unspecified: Secondary | ICD-10-CM

## 2020-06-17 DIAGNOSIS — K130 Diseases of lips: Secondary | ICD-10-CM

## 2020-06-17 DIAGNOSIS — N6310 Unspecified lump in the right breast, unspecified quadrant: Secondary | ICD-10-CM

## 2020-06-17 DIAGNOSIS — Z0000 Encounter for general adult medical examination without abnormal findings: Secondary | ICD-10-CM

## 2020-06-17 DIAGNOSIS — Z1159 Encounter for screening for other viral diseases: Secondary | ICD-10-CM

## 2020-06-17 MED ORDER — MUPIROCIN 2 % EX OINT: 1.0000 "application " | TOPICAL_OINTMENT | Freq: Two times a day (BID) | CUTANEOUS | 0 refills | Status: DC

## 2020-06-17 NOTE — Progress Notes (Signed)
Patient: [name] [name] MRN: [id number] DOB: [birth year] PCP: Orma Flaming, MD     Subjective:  Chief Complaint  Patient presents with  . Annual Exam  . Hypothyroidism  . right breast mass  . cracking sides of mouth    HPI: The patient is a 24 y.o. female who presents today for annual exam and other concerns to address. She denies any changes to past medical history. There have been no recent hospitalizations. They are following a well balanced diet and exercise plan. Se sees a trainer 3 times weekly. Weight has been stable. She says that she has a lump on her right breast. She also says that she has lesions in the corners of her lips. She noticed 8 months ago. She has used lip balms and carmex, but they still appear.   No first degree relative with breast or colon cancer.   Hypothyroidism  Currently on synthroid 15mcg/day. She has not been consistent with taking her medication. She has not lost any weight. She does not have irregular periods, dry skin, constipation. No issues swallowing. No hot or cold intolerance.   Lump on right breast First noticed it 2 weeks ago. She states it is hard and similar to one she has on her left breast. She isn't sure if mass is mobile. Doesn't think it's growing.  She had her left breast imaged and it was benign. No first degree with breast cancer, but maternal breast cancer. No color change, nipple discharge.   Cracks on side of mouth -started 8 months ago. Mask wearing does not help. Wax and wane. Cracks are deep and painful only at corners of mouth.   There is no immunization history for the selected administration types on file for this patient.   Colonoscopy: routine screening  Mammogram: ultrasound today  Pap smear: 03/2018   Review of Systems  Constitutional: Negative for chills, fatigue and fever.  HENT: Positive for mouth sores. Negative for dental problem, ear pain, facial swelling, hearing loss and trouble swallowing.   Eyes:  Negative for visual disturbance.  Respiratory: Negative for cough, chest tightness and shortness of breath.   Cardiovascular: Negative for chest pain, palpitations and leg swelling.  Gastrointestinal: Negative for abdominal pain, blood in stool, diarrhea and nausea.  Endocrine: Negative for cold intolerance, polydipsia, polyphagia and polyuria.  Genitourinary: Negative for dysuria, frequency, hematuria and pelvic pain.  Musculoskeletal: Negative for arthralgias.  Skin: Negative for rash.  Neurological: Negative for dizziness and headaches.  Psychiatric/Behavioral: Negative for dysphoric mood and sleep disturbance. The patient is not nervous/anxious.     Allergies Patient is allergic to vancomycin.  Past Medical History Patient  has a past medical history of Anemia, Fibroadenoma of left breast, Fibroid, Headache, History of chicken pox, Medical history non-contributory, Streptococcus B carrier or suspected carrier, and Thyroid disease.  Surgical History Patient  has a past surgical history that includes No past surgeries.  Family History Pateint's family history includes Bladder Cancer in her paternal grandmother; Breast cancer in her maternal grandmother; COPD in her maternal grandmother; Diabetes in her maternal grandmother; Healthy in her mother; Hyperlipidemia in her father; Hypertension in her father; Mental retardation in her brother.  Social History Patient  reports that she quit smoking about 9 years ago. She has never used smokeless tobacco. She reports that she does not drink alcohol and does not use drugs.    Objective: Vitals:   06/17/20 1006  BP: 108/75  Pulse: 66  Temp: (!) 97.3 F (36.3 C)  TempSrc: Temporal  SpO2: 99%  Weight: 256 lb (116.1 kg)  Height: 5\' 8"  (1.727 m)    Body mass index is 38.92 kg/m.  Physical Exam Vitals reviewed.  Constitutional:      Appearance: Normal appearance. She is well-developed. She is obese.  HENT:     Head: Normocephalic  and atraumatic.     Right Ear: Tympanic membrane, ear canal and external ear normal.     Left Ear: Tympanic membrane, ear canal and external ear normal.     Nose: Nose normal.     Mouth/Throat:     Mouth: Mucous membranes are moist.  Eyes:     Extraocular Movements: Extraocular movements intact.     Conjunctiva/sclera: Conjunctivae normal.     Pupils: Pupils are equal, round, and reactive to light.  Neck:     Thyroid: No thyromegaly.  Cardiovascular:     Rate and Rhythm: Normal rate and regular rhythm.     Pulses: Normal pulses.     Heart sounds: Normal heart sounds. No murmur heard.   Pulmonary:     Effort: Pulmonary effort is normal.     Breath sounds: Normal breath sounds.  Chest:     Breasts:        Right: Mass and tenderness present. No swelling, bleeding, inverted nipple, nipple discharge or skin change.        Left: Normal.       Comments: Area of concern. Flat, palpable mass. Slightly tender  Abdominal:     General: Bowel sounds are normal. There is no distension.     Palpations: Abdomen is soft.     Tenderness: There is no abdominal tenderness.  Musculoskeletal:     Cervical back: Normal range of motion and neck supple.  Lymphadenopathy:     Cervical: No cervical adenopathy.  Skin:    General: Skin is warm and dry.     Capillary Refill: Capillary refill takes less than 2 seconds.     Findings: No rash.     Comments: Cracking at bilateral oral commissures   Neurological:     General: No focal deficit present.     Mental Status: She is alert and oriented to person, place, and time.     Cranial Nerves: No cranial nerve deficit.     Coordination: Coordination normal.     Deep Tendon Reflexes: Reflexes normal.  Psychiatric:        Mood and Affect: Mood normal.        Behavior: Behavior normal.      Office Visit from 06/17/2020 in [facility]  PHQ-2 Total Score 0         Assessment/plan: 1. Annual physical exam Routine labs today.  She is not fasting, but no hx of hyperlipidemia. Hm reviewed. Discussed vaccines. Starting some vitamins. She is really doing a good job with weight/exercise and looks great. Continue this. Stopped working and staying home with 26 year old. Has really helped stress/anxiety. F/u in one year or as needed.  Patient counseling [x]    Nutrition: Stressed importance of moderation in sodium/caffeine intake, saturated fat and cholesterol, caloric balance, sufficient intake of fresh fruits, vegetables, fiber, calcium, iron, and 1 mg of folate supplement per day (for females capable of pregnancy).  [x]    Stressed the importance of regular exercise.   []    Substance Abuse: Discussed cessation/primary prevention of tobacco, alcohol, or other drug use; driving or other dangerous activities under the influence; availability of treatment for abuse.   [  x]   Injury prevention: Discussed safety belts, safety helmets, smoke detector, smoking near bedding or upholstery.   [x]    Sexuality: Discussed sexually transmitted diseases, partner selection, use of condoms, avoidance of unintended pregnancy  and contraceptive alternatives.  [x]    Dental health: Discussed importance of regular tooth brushing, flossing, and dental visits.  [x]    Health maintenance and immunizations reviewed. Please refer to Health maintenance section.    - CBC with Differential/Platelet; Future - Lipid panel; Future - COMPLETE METABOLIC PANEL WITH GFR; Future - COMPLETE METABOLIC PANEL WITH GFR - Lipid panel - CBC with Differential/Platelet  2. Encounter for hepatitis C screening test for low risk patient  - Hepatitis C antibody; Future - Hepatitis C antibody  3. Acquired hypothyroidism Has not been taking medication. Will likely be abnormal.  - T4, free; Future - TSH; Future - TSH - T4, free  4. Breast mass, right Reassured her it feels like fibrocystic breat tissue and on pathological issue. Will check ultrasound and asked her to see  if changes with cycle.  - [place] Unlisted Procedure Breast; Future  5. Angular cheilitis Start clotrimazole in AM and then bactroban in Pm. Hygiene discussed. Masks likely contributing. Once healed use petroleum at night. Let me know if not getting better.   This visit occurred during the SARS-CoV-2 public health emergency.  Safety protocols were in place, including screening questions prior to the visit, additional usage of staff PPE, and extensive cleaning of exam room while observing appropriate contact time as indicated for disinfecting solutions.     Return in about 1 year (around 06/17/2021).     Orma Flaming, MD [place]  06/17/2020

## 2020-06-17 NOTE — Patient Instructions (Signed)
Breast feels like normal fibrocystic tissue. Cutting out caffiene can help and can change with cycle. Did order an ultrasound to confirm this. They will call you to set up.   Routine labs today.   For your cracked lips.  -trial clotrimazole (over the counter in am) and bactroban in the pm. Once cleared need to use a barrier cream at night like petroleum (vasoline)   Vitamins I want you on during covid 1) vitamin D3 2000IU/day 2) zinc 37m/day 3) vitamin C: 10062mdy 4) quercetin 25034may 5) melatonin 6mg2m night (may make you drowsy) Call if you get covid!  So good to see you!  Dr. WolfRogers BlockerPreventive Care [id number] Old, Female Preventive care refers to visits with your health care provider and lifestyle choices that can promote health and wellness. This includes:  A yearly physical exam. This may also be called an annual well check.  Regular dental visits and eye exams.  Immunizations.  Screening for certain conditions.  Healthy lifestyle choices, such as eating a healthy diet, getting regular exercise, not using drugs or products that contain nicotine and tobacco, and limiting alcohol use. What can I expect for my preventive care visit? Physical exam Your health care provider will check your:  Height and weight. This may be used to calculate body mass index (BMI), which tells if you are at a healthy weight.  Heart rate and blood pressure.  Skin for abnormal spots. Counseling Your health care provider may ask you questions about your:  Alcohol, tobacco, and drug use.  Emotional well-being.  Home and relationship well-being.  Sexual activity.  Eating habits.  Work and work enviStatisticianethod of birth control.  Menstrual cycle.  Pregnancy history. What immunizations do I need?  Influenza (flu) vaccine  This is recommended every year. Tetanus, diphtheria, and pertussis (Tdap) vaccine  You may need a Td booster every 10 years. Varicella  (chickenpox) vaccine  You may need this if you have not been vaccinated. Human papillomavirus (HPV) vaccine  If recommended by your health care provider, you may need three doses over 6 months. Measles, mumps, and rubella (MMR) vaccine  You may need at least one dose of MMR. You may also need a second dose. Meningococcal conjugate (MenACWY) vaccine  One dose is recommended if you are age 65-21 years and a first-year college student living in a residence hall, or if you have one of several medical conditions. You may also need additional booster doses. Pneumococcal conjugate (PCV13) vaccine  You may need this if you have certain conditions and were not previously vaccinated. Pneumococcal polysaccharide (PPSV23) vaccine  You may need one or two doses if you smoke cigarettes or if you have certain conditions. Hepatitis A vaccine  You may need this if you have certain conditions or if you travel or work in places where you may be exposed to hepatitis A. Hepatitis B vaccine  You may need this if you have certain conditions or if you travel or work in places where you may be exposed to hepatitis B. Haemophilus influenzae type b (Hib) vaccine  You may need this if you have certain conditions. You may receive vaccines as individual doses or as more than one vaccine together in one shot (combination vaccines). Talk with your health care provider about the risks and benefits of combination vaccines. What tests do I need?  Blood tests  Lipid and cholesterol levels. These may be checked every 5 years starting at age 30.74  Hepatitis C test.  Hepatitis B test. Screening  Diabetes screening. This is done by checking your blood sugar (glucose) after you have not eaten for a while (fasting).  Sexually transmitted disease (STD) testing.  BRCA-related cancer screening. This may be done if you have a family history of breast, ovarian, tubal, or peritoneal cancers.  Pelvic exam and Pap test.  This may be done every 3 years starting at age 29. Starting at age 47, this may be done every 5 years if you have a Pap test in combination with an HPV test. Talk with your health care provider about your test results, treatment options, and if necessary, the need for more tests. Follow these instructions at home: Eating and drinking   Eat a diet that includes fresh fruits and vegetables, whole grains, lean protein, and low-fat dairy.  Take vitamin and mineral supplements as recommended by your health care provider.  Do not drink alcohol if: ? Your health care provider tells you not to drink. ? You are pregnant, may be pregnant, or are planning to become pregnant.  If you drink alcohol: ? Limit how much you have to 0-1 drink a day. ? Be aware of how much alcohol is in your drink. In the [place] one drink equals one 12 oz bottle of beer (355 mL), one 5 oz glass of wine (148 mL), or one 1 oz glass of hard liquor (44 mL). Lifestyle  Take daily care of your teeth and gums.  Stay active. Exercise for at least 30 minutes on 5 or more days each week.  Do not use any products that contain nicotine or tobacco, such as cigarettes, e-cigarettes, and chewing tobacco. If you need help quitting, ask your health care provider.  If you are sexually active, practice safe sex. Use a condom or other form of birth control (contraception) in order to prevent pregnancy and STIs (sexually transmitted infections). If you plan to become pregnant, see your health care provider for a preconception visit. What's next?  Visit your health care provider once a year for a well check visit.  Ask your health care provider how often you should have your eyes and teeth checked.  Stay up to date on all vaccines. This information is not intended to replace advice given to you by your health care provider. Make sure you discuss any questions you have with your health care provider. Document Revised: 04/03/2018 Document  Reviewed: 04/03/2018 Elsevier Patient Education  2020 [employer].

## 2020-06-20 ENCOUNTER — Other Ambulatory Visit: Payer: Self-pay | Admitting: Family Medicine

## 2020-06-20 DIAGNOSIS — E039 Hypothyroidism, unspecified: Secondary | ICD-10-CM

## 2020-06-20 LAB — CBC WITH DIFFERENTIAL/PLATELET
Absolute Monocytes: 391 cells/uL (ref 200–950)
Basophils Absolute: 50 cells/uL (ref 0–200)
Basophils Relative: 0.8 %
Eosinophils Absolute: 69 cells/uL (ref 15–500)
Eosinophils Relative: 1.1 %
HCT: 38.9 % (ref 35.0–45.0)
Hemoglobin: 13 g/dL (ref 11.7–15.5)
Lymphs Abs: 2652 cells/uL (ref 850–3900)
MCH: 29.4 pg (ref 27.0–33.0)
MCHC: 33.4 g/dL (ref 32.0–36.0)
MCV: 88 fL (ref 80.0–100.0)
MPV: 10.2 fL (ref 7.5–12.5)
Monocytes Relative: 6.2 %
Neutro Abs: 3137 cells/uL (ref 1500–7800)
Neutrophils Relative %: 49.8 %
Platelets: 286 10*3/uL (ref 140–400)
RBC: 4.42 10*6/uL (ref 3.80–5.10)
RDW: 12.5 % (ref 11.0–15.0)
Total Lymphocyte: 42.1 %
WBC: 6.3 10*3/uL (ref 3.8–10.8)

## 2020-06-20 LAB — COMPLETE METABOLIC PANEL WITH GFR
AG Ratio: 2 (calc) (ref 1.0–2.5)
ALT: 20 U/L (ref 6–29)
AST: 16 U/L (ref 10–30)
Albumin: 4.4 g/dL (ref 3.6–5.1)
Alkaline phosphatase (APISO): 57 U/L (ref 31–125)
BUN: 14 mg/dL (ref 7–25)
CO2: 24 mmol/L (ref 20–32)
Calcium: 9.4 mg/dL (ref 8.6–10.2)
Chloride: 106 mmol/L (ref 98–110)
Creat: 0.9 mg/dL (ref 0.50–1.10)
GFR, Est African American: 102 mL/min/{1.73_m2} (ref >=60)
GFR, Est Non African American: 88 mL/min/{1.73_m2} (ref >=60)
Globulin: 2.2 g/dL (calc) (ref 1.9–3.7)
Glucose, Bld: 76 mg/dL (ref 65–99)
Potassium: 4.5 mmol/L (ref 3.5–5.3)
Sodium: 138 mmol/L (ref 135–146)
Total Bilirubin: 0.7 mg/dL (ref 0.2–1.2)
Total Protein: 6.6 g/dL (ref 6.1–8.1)

## 2020-06-20 LAB — HEPATITIS C ANTIBODY
Hepatitis C Ab: NONREACTIVE
SIGNAL TO CUT-OFF: 0.39 (ref <1.00)

## 2020-06-20 LAB — LIPID PANEL
Cholesterol: 166 mg/dL (ref <200)
HDL: 43 mg/dL — ABNORMAL LOW (ref >=50)
LDL Cholesterol (Calc): 100 mg/dL (calc) — ABNORMAL HIGH
Non-HDL Cholesterol (Calc): 123 mg/dL (calc) (ref <130)
Total CHOL/HDL Ratio: 3.9 (calc) (ref <5.0)
Triglycerides: 126 mg/dL (ref <150)

## 2020-06-20 LAB — TSH: TSH: 9.64 mIU/L — ABNORMAL HIGH

## 2020-06-20 LAB — T4, FREE: Free T4: 1 ng/dL (ref 0.8–1.8)

## 2020-06-20 MED ORDER — LEVOTHYROXINE SODIUM 100 MCG PO TABS
100.0000 ug | ORAL_TABLET | Freq: Every day | ORAL | 0 refills | Status: DC
Start: 2020-06-20 — End: 2021-01-20

## 2020-07-13 ENCOUNTER — Other Ambulatory Visit: Payer: Self-pay | Admitting: Family Medicine

## 2020-07-19 IMAGING — CT CT TEMPORAL BONES W/ CM
3 of 8 series · 12 of 40 positions shown, 14 images · IV contrast (APPLIED)
Comparison: None.

CLINICAL DATA: Initial evaluation for acute severe right ear pain,
headache, concern for mastoiditis.

EXAM:
CT TEMPORAL BONES WITH CONTRAST
TECHNIQUE: Axial and coronal plane CT imaging of the petrous temporal bones was
performed with thin-collimation image reconstruction after
intravenous contrast administration. Multiplanar CT image
reconstructions were also generated.
CONTRAST:  75mL OMNIPAQUE IOHEXOL 300 MG/ML  SOLN

[Series 8: temp bone bilat bone 0.6 uq77 · axial · 0.40mm/px · z∈[-156,-116]mm · 7 of 92 slices shown, 9 images]
[im 12/92  brain]
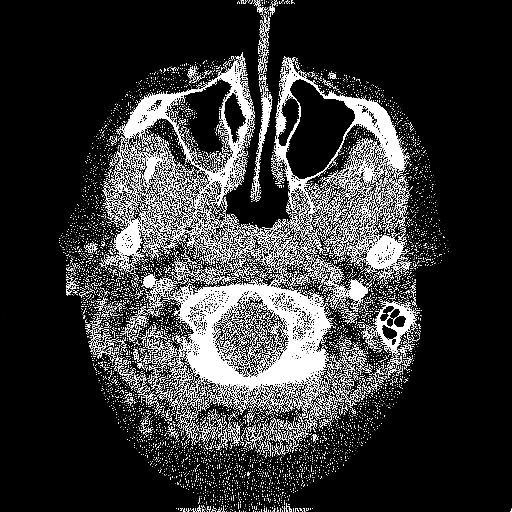
[im 12/92  bone]
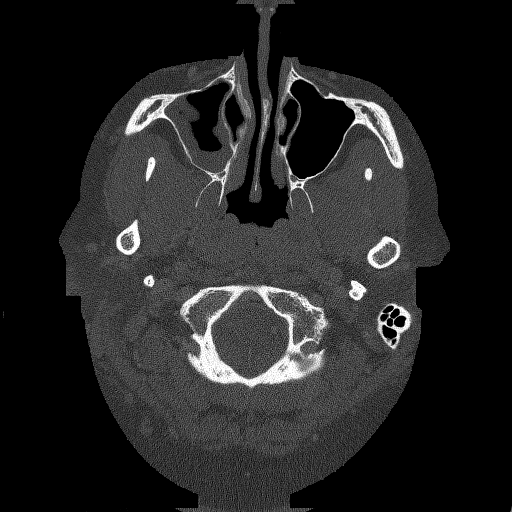
[im 23/92  bone]
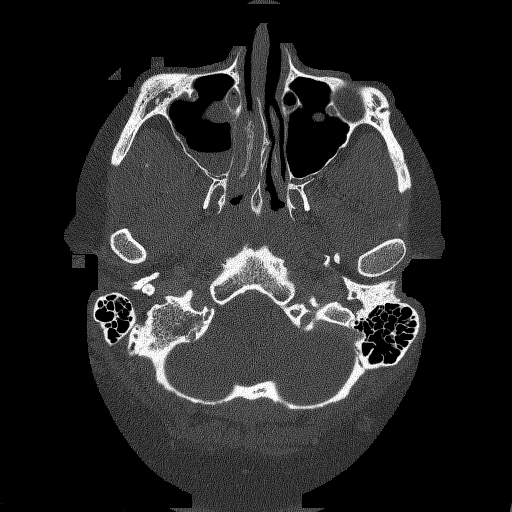
[im 35/92  bone]
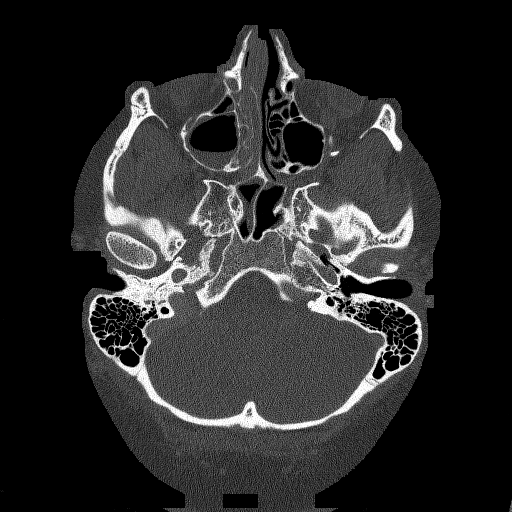
[im 46/92  bone]
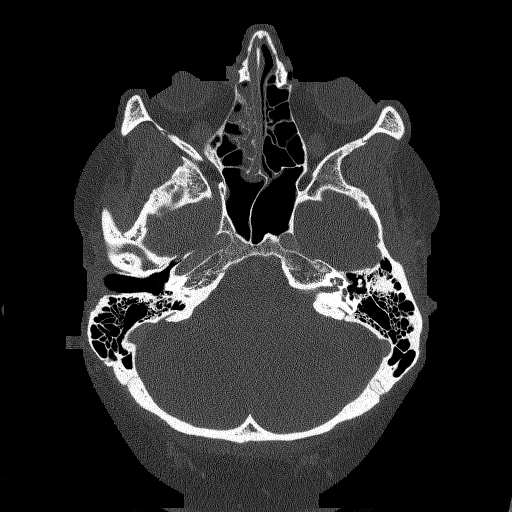
[im 57/92  brain]
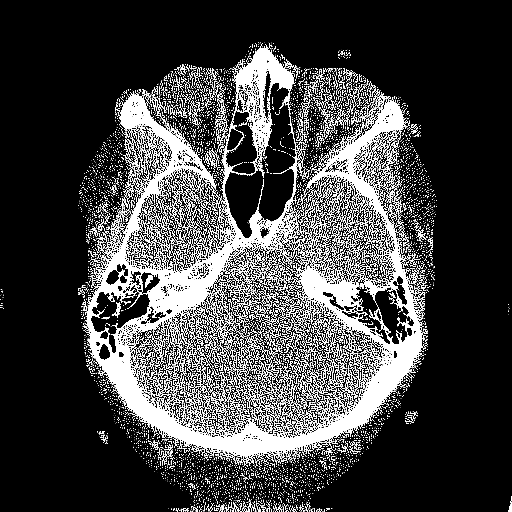
[im 57/92  bone]
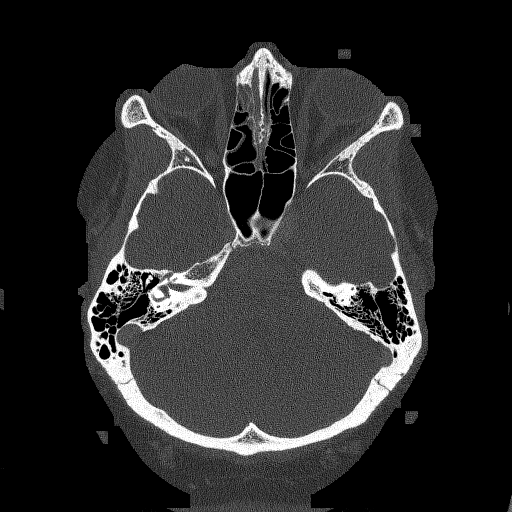
[im 69/92  bone]
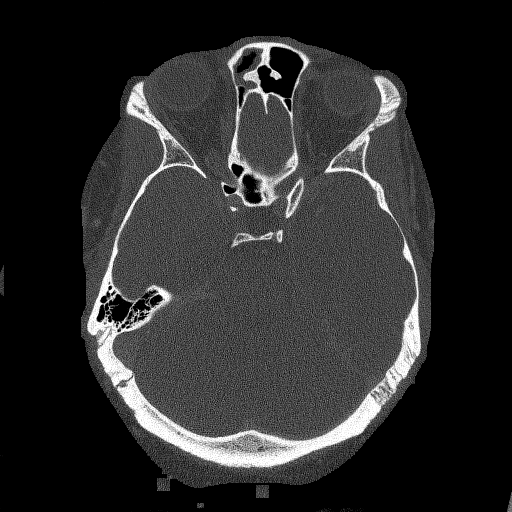
[im 80/92  bone]
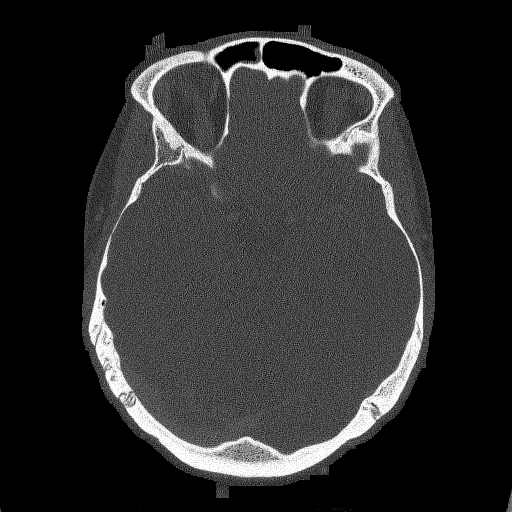

[Series 9: temp bone axial mag rt · axial · 0.18mm/px · z∈[-156,-143]mm · 3 of 92 slices shown]
[im 12/92  bone]
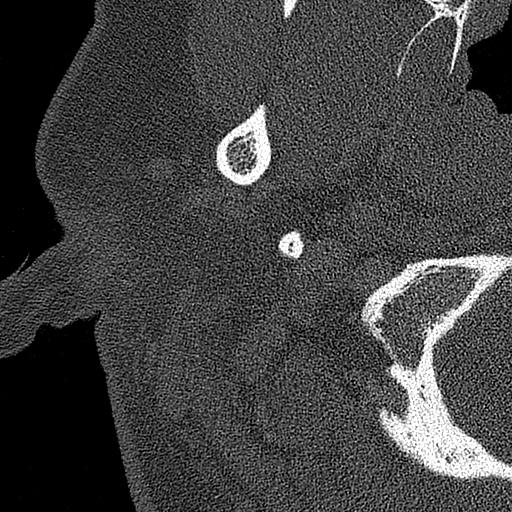
[im 23/92  bone]
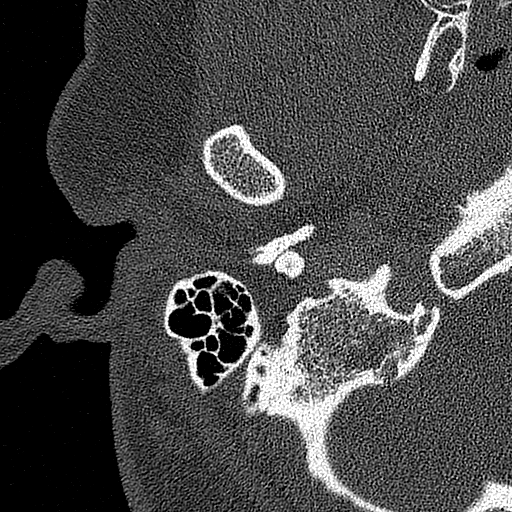
[im 35/92  bone]
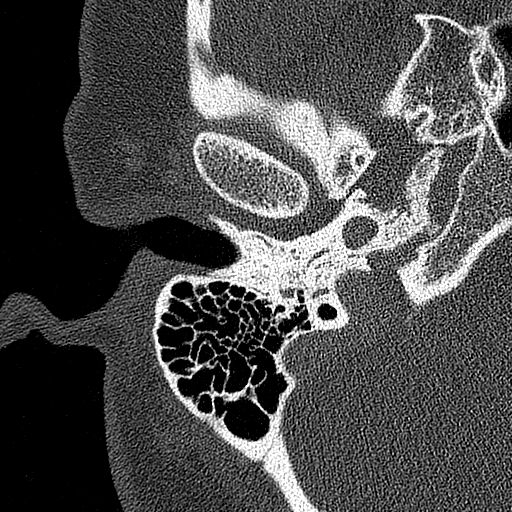

[Series 12: temp bone bilat coronal bone · coronal · 0.12mm/px · 2 of 356 slices shown]
[im 119/356  bone]
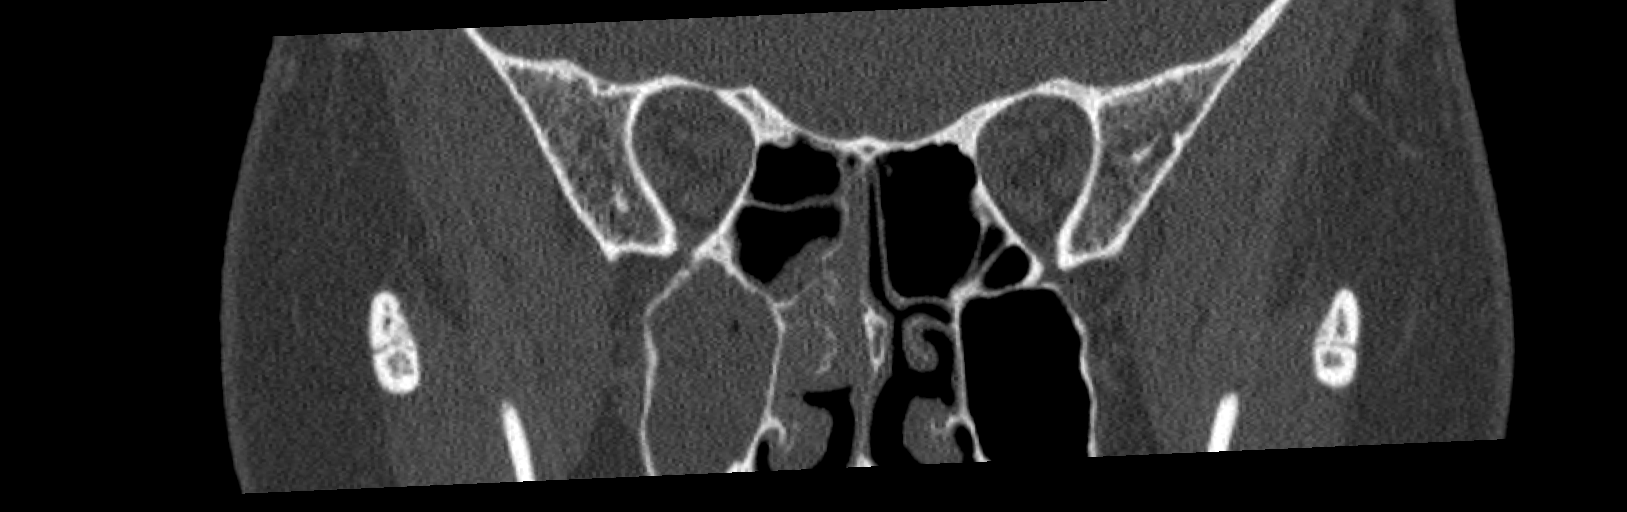
[im 237/356  bone]
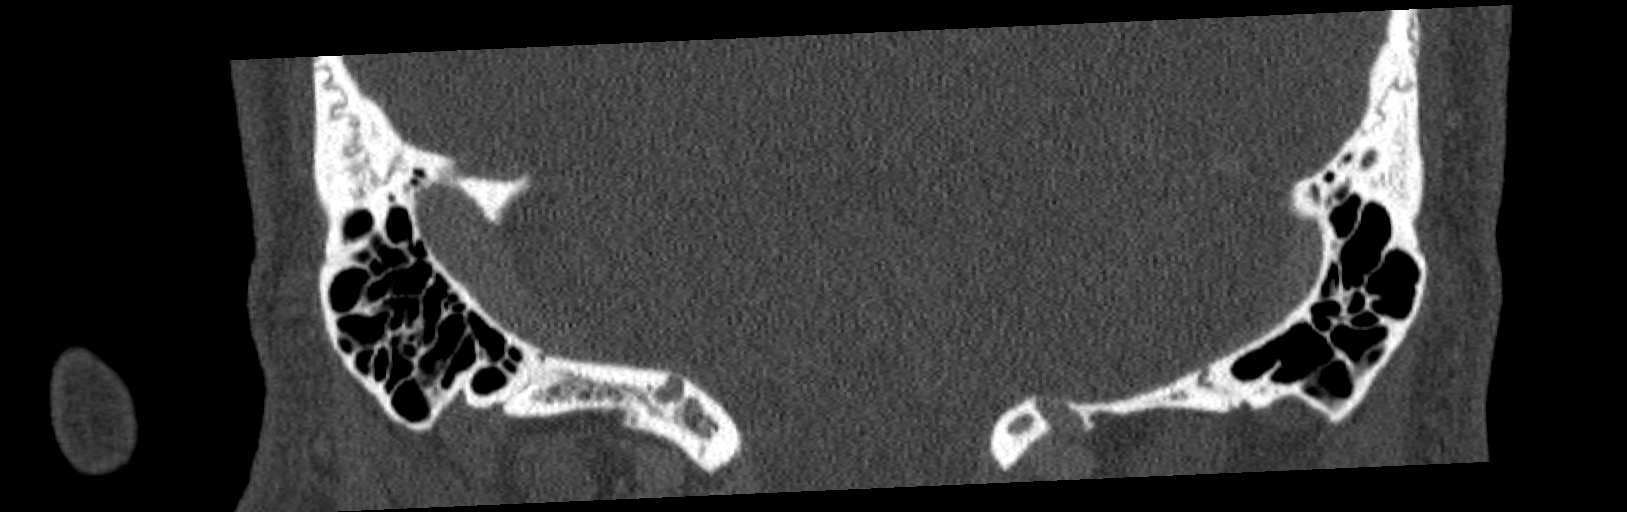

[12 of 40 positions shown; findings below may reference images not displayed]

FINDINGS: RIGHT TEMPORAL BONE FINDINGS:

The auricle and pinna are within normal limits. No pre or
postauricular soft tissue swelling or inflammatory changes. Right
EAC is clear. Tympanic membrane is thin. Middle ear cavity is well
pneumatized and clear. Tegmen tympani intact. Ossicular chain
normally formed and intact. Mastoid air cells are clear. No
coalescence. Sigmoid plate intact. Normal opacification seen within
the right transverse and sigmoid sinuses as well as the right
jugular bulb. Internal auditory canal within normal limits. Inner
ear structures including the vestibule, cochlea, and semi circular
canals are within normal limits. Facial nerve canal intact and bony
covered. No abnormality at the stylomastoid foramen. Vestibular
aqueduct within normal limits. Carotid canal intact and within
normal limits. Jugular bulb within normal limits.

LEFT TEMPORAL BONE FINDINGS:

The auricle and pinna are within normal limits. No pre or
postauricular soft tissue swelling or inflammatory changes. Left EAC
is clear. Tympanic membrane is thin. Middle ear cavity is well
pneumatized and clear. Tegmen tympani intact. Ossicular chain
normally formed and intact. Mastoid air cells are clear. No
coalescence. Sigmoid plate intact. Normal opacification seen within
the right transverse and sigmoid sinuses as well as the right
jugular bulb. Internal auditory canal within normal limits. Inner
ear structures including the vestibule, cochlea, and semi circular
canals are within normal limits. Facial nerve canal intact and bony
covered. No abnormality at the stylomastoid foramen. Vestibular
aqueduct within normal limits. Carotid canal intact and within
normal limits. Jugular bulb within normal limits.

Visualized intracranial contents within normal limits. Globes and
orbital soft tissues unremarkable. Scattered mucoperiosteal
thickening present throughout the right frontal and ethmoidal air
cells as well as the right greater than left sphenoid sinuses.
Mucosal thickening with air-fluid level noted within the right
maxillary sinus, suggesting acute sinusitis.
IMPRESSION: 1. Normal temporal bone CT. No evidence for acute otomastoiditis or
other abnormality.
2. Acute right-sided paranasal sinus disease as above.

## 2020-08-04 ENCOUNTER — Ambulatory Visit: Payer: Self-pay

## 2020-08-04 ENCOUNTER — Other Ambulatory Visit: Payer: Self-pay

## 2020-08-04 ENCOUNTER — Ambulatory Visit (INDEPENDENT_AMBULATORY_CARE_PROVIDER_SITE_OTHER): Payer: Self-pay | Admitting: Family Medicine

## 2020-08-04 VITALS — BP 138/94 | HR 88 | Ht 68.0 in | Wt 256.8 lb

## 2020-08-04 DIAGNOSIS — M25511 Pain in right shoulder: Secondary | ICD-10-CM

## 2020-08-04 NOTE — Progress Notes (Signed)
° °  I, Christoper Fabian, LAT, ATC, am serving as scribe for Dr. Clementeen Graham.  [name] [name] is a 24 y.o. female who presents to [facility] today for R shoulder and upper arm pain.  She was last seen by Dr. Denyse Amass on 07/20/19 for R distal bicep/arm pain that she initially injured her R distal arm while doing weight lifting including push-ups and pulling activity for her upper back w/ her personal trainer on 07/14/19. Since then, pt reports pain in anterior aspect of R shoulder. Pt notes on 11/15 noticed a pulling in shoulder, but was able to finish workout. Pt describes pain as "burning."  Radiating pain: to mid-humerus Aggravating factors: shoulder ABD, vacuuming, laying on side Treatments tried: stretching   Pertinent review of systems: No fevers or chills  Relevant historical information: Hypothyroidism.  Obesity.  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   Exam:  BP (!) 138/94 (BP Location: Right Arm, Patient Position: Sitting, Cuff Size: Normal)    Pulse 88    Ht 5\' 8"  (1.727 m)    Wt 256 lb 12.8 oz (116.5 kg)    SpO2 99%    BMI 39.05 kg/m  General: Well Developed, well nourished, and in no acute distress.   MSK: Right shoulder normal. Normal motion. Intact strength abduction external/internal rotation some pain with abduction. Positive empty can test and positive Hawkins and Neer's test. Negative Yergason's and speeds test.     Lab and Radiology Results Diagnostic Limited MSK Ultrasound of: Right shoulder.  Of note body habitus limits accuracy of ultrasound Biceps tendon intact normal. Subscapularis tendon is intact. Supraspinatus tendon is intact however hypoechoic fluid tracking superficial to tendon indicates a moderate subacromial bursitis. Infraspinatus tendon is intact. AC joint is normal-appearing Impression: Subacromial bursitis     Assessment and Plan: 24 y.o. female with right  shoulder pain thought to be due to rotator cuff tendinitis and subacromial bursitis.  She is a great candidate for conservative management.  Ideally she would do physical therapy but her lack of health insurance limits her access to physical therapy.  We will spent time today teaching home exercise program with ATC.  Also provided information on [place] charity program application.  I think she would probably be eligible and that will allow her to access physical therapy.  Additionally mentioned to the [facility] pro bono PT clinic.  If not better may consider PT more strongly more proceed with steroid injection.  Patient will keep me updated.   PDMP not reviewed this encounter. Orders Placed This Encounter  Procedures   [facility] WITH [facility]EL[facility] LIMITED JOINT SPACE STRUCTURES UP RIGHT(NO LINKED CHARGES)    Standing Status:   Future    Number of Occurrences:   1    Standing Expiration Date:   02/02/2021    Order Specific Question:   Reason for Exam (SYMPTOM  OR DIAGNOSIS REQUIRED)    Answer:   right shoulder pain    Order Specific Question:   Preferred imaging location?    Answer:   [facility]   No orders of the defined types were placed in this encounter.    Discussed warning signs or symptoms. Please see discharge instructions. Patient expresses understanding.   The above documentation has been reviewed and is accurate and complete 02/04/2021, M.D.

## 2020-08-04 NOTE — Patient Instructions (Addendum)
Thank you for coming in today.  I think you have some rotator cuff tendonitis and bursitis.   Work on home exercises. View at [email or web address]-exercise-[email or web address] using code: [email or web address]  Can consider physical therapy with [facility] especially if you are eligable for [facility] program or possibly at [facility] pro bono PT clinic.   Let me know.   Please use voltaren gel up to 4x daily for pain as needed.

## 2020-09-25 ENCOUNTER — Other Ambulatory Visit: Payer: Self-pay | Admitting: Family Medicine

## 2020-10-18 ENCOUNTER — Other Ambulatory Visit: Payer: Self-pay | Admitting: Obstetrics and Gynecology

## 2020-10-18 DIAGNOSIS — D242 Benign neoplasm of left breast: Secondary | ICD-10-CM

## 2020-11-01 NOTE — Progress Notes (Deleted)
    [name] [name] is a 25 y.o. female who presents to [facility] today for low back pain.  She was last seen by Dr. Georgina [name] on 08/04/20 for R shoulder and upper arm pain.  Since then, pt reports low back pain since .  Radiating pain: LE numbness/tingling: Aggravating factors: Treatments tried:   Pertinent review of systems: ***  Relevant historical information: ***   Exam:  There were no vitals taken for this visit. General: Well Developed, well nourished, and in no acute distress.   MSK: ***    Lab and Radiology Results No results found for this or any previous visit (from the past 72 hour(s)). No results found.     Assessment and Plan: 24 y.o. female with ***   PDMP not reviewed this encounter. No orders of the defined types were placed in this encounter.  No orders of the defined types were placed in this encounter.    Discussed warning signs or symptoms. Please see discharge instructions. Patient expresses understanding.   ***

## 2020-11-02 ENCOUNTER — Ambulatory Visit: Payer: Self-pay | Admitting: Family Medicine

## 2020-11-14 ENCOUNTER — Ambulatory Visit
Admission: RE | Admit: 2020-11-14 | Discharge: 2020-11-14 | Disposition: A | Discharge status: Home / Self Care | Payer: PRIVATE HEALTH INSURANCE | Source: Ambulatory Visit | Attending: Obstetrics and Gynecology | Admitting: Obstetrics and Gynecology

## 2020-11-14 ENCOUNTER — Other Ambulatory Visit: Payer: Self-pay

## 2020-11-14 DIAGNOSIS — D242 Benign neoplasm of left breast: Secondary | ICD-10-CM

## 2021-01-20 ENCOUNTER — Encounter: Payer: Self-pay | Admitting: Family Medicine

## 2021-01-20 ENCOUNTER — Other Ambulatory Visit: Payer: Self-pay

## 2021-01-20 ENCOUNTER — Ambulatory Visit (INDEPENDENT_AMBULATORY_CARE_PROVIDER_SITE_OTHER): Payer: PRIVATE HEALTH INSURANCE | Admitting: Family Medicine

## 2021-01-20 VITALS — BP 114/71 | HR 84 | Temp 98.7°F | Ht 68.0 in | Wt 254.6 lb

## 2021-01-20 DIAGNOSIS — N6012 Diffuse cystic mastopathy of left breast: Secondary | ICD-10-CM | POA: Diagnosis not present

## 2021-01-20 DIAGNOSIS — N6011 Diffuse cystic mastopathy of right breast: Secondary | ICD-10-CM | POA: Diagnosis not present

## 2021-01-20 NOTE — Progress Notes (Signed)
Subjective  CC:  Chief Complaint  Patient presents with   Breast Problem    Lump on left breast, painful every now and then. Has had it looked at before   I reviewed recent notes and breast ultrasounds.   HPI: [name] [name] is a 25 y.o. female who presents to the office today to address the problems listed above in the chief complaint. 25 year old female with concerns regarding left-sided breast mass.  Patient first noticed a left breast mass at 1:00 back in 2018.  I reviewed notes.  She had ultrasound at that time that showed that it was a probable fibroadenoma.  Since then, the mass has persisted.  She had it looked at again back in April.  Ultrasound confirmed the persistent mass that was either fat line annual or fibroadenoma.  It has not been biopsied.  However over the last month, she has noted a new mass closer to the nipple on the left.  Minimal tenderness.  No nipple drainage.  Average risk for breast cancer.  Assessment  1. Fibrocystic breast changes, bilateral      Plan  Fibrocystic breast changes bilaterally: Cystis changes are palpated bilaterally.  Educated.  See after visit summary for education.  Reassured.  Will reassess in 3 months if mass does not resolve. Persistent left breast mass: Stable over 3 years clinically and with ultrasound surveillance.  Reassured.  Definitely benign.  Possible fibroadenoma.  If continues to be bothersome she could have it excised.  But this is not indicated at this time.  Follow up: As needed Visit date not found  No orders of the defined types were placed in this encounter.  No orders of the defined types were placed in this encounter.     I reviewed the patients updated PMH, FH, and SocHx.    Patient Active Problem List   Diagnosis Date Noted   Pelvic pain in female 10/22/2019   Acquired hypothyroidism 01/01/2019   Mastoiditis 11/13/2018   Chronic migraine 12/13/2015   Low back pain 12/13/2015   Morbid obesity ([place])  11/01/2015   No outpatient medications have been marked as taking for the 01/20/21 encounter (Office Visit) with Leamon Arnt, MD.    Allergies: Patient is allergic to vancomycin. Family History: Patient family history includes Bladder Cancer in her paternal grandmother; Breast cancer in her maternal grandmother; COPD in her maternal grandmother; Diabetes in her maternal grandmother; Healthy in her mother; Hyperlipidemia in her father; Hypertension in her father; Mental retardation in her brother. Social History:  Patient  reports that she quit smoking about 10 years ago. Her smoking use included cigarettes. She has never used smokeless tobacco. She reports that she does not drink alcohol and does not use drugs.  Review of Systems: Constitutional: Negative for fever malaise or anorexia Cardiovascular: negative for chest pain Respiratory: negative for SOB or persistent cough Gastrointestinal: negative for abdominal pain  Objective  Vitals: BP 114/71   Pulse 84   Temp 98.7 F (37.1 C) (Temporal)   Ht 5\' 8"  (1.727 m)   Wt 254 lb 9.6 oz (115.5 kg)   SpO2 99%   BMI 38.71 kg/m  General: no acute distress , A&Ox3 Breast: Left nontender mass at 1:00.  Medial to that smaller cystic mobile mass nontender, no nipple discharges no lymphadenopathy Right breast with small cystic changes at 9:00.  No lymphadenopathy  Commons side effects, risks, benefits, and alternatives for medications and treatment plan prescribed today were discussed, and the patient expressed  understanding of the given instructions. Patient is instructed to call or message via MyChart if he/she has any questions or concerns regarding our treatment plan. No barriers to understanding were identified. We discussed Red Flag symptoms and signs in detail. Patient expressed understanding regarding what to do in case of urgent or emergency type symptoms.  Medication list was reconciled, printed and provided to the patient in AVS.  Patient instructions and summary information was reviewed with the patient as documented in the AVS. This note was prepared with assistance of Dragon voice recognition software. Occasional wrong-word or sound-a-like substitutions may have occurred due to the inherent limitations of voice recognition software  This visit occurred during the SARS-CoV-2 public health emergency.  Safety protocols were in place, including screening questions prior to the visit, additional usage of staff PPE, and extensive cleaning of exam room while observing appropriate contact time as indicated for disinfecting solutions.

## 2021-01-20 NOTE — Patient Instructions (Signed)
Please follow up if symptoms do not improve or as needed.    Your exam today is most consistent with fibrocystic breast changes. You may also have a benign fibroadenoma in the left breast. Both are benign  Fibrocystic Breast Changes  Fibrocystic breast changes are changes in breast tissue that can cause breasts to become swollen, lumpy, or painful. This can happen due to buildup of scar-like tissue (fibrous tissue) or the forming of fluid-filled lumps (cysts) in the breast. Fibrocystic breast changes can affect one or both breasts. Thecondition is common, and it is not cancer. What are the causes? The exact cause of fibrocystic breast changes is not known. However, this condition may be: Related to the female hormones estrogen and progesterone. Influenced by family traits that get passed from parent to child (inherited). What are the signs or symptoms? Symptoms of this condition include: Tenderness, swelling, mild discomfort, or pain. Rope-like tissue that can be felt when touching the breast. Lumps in one or both breasts. Changes in breast size. Breasts may get larger before a menstrual period and smaller after a menstrual period. Discharge from the nipple. Symptoms of this condition may affect one or both breasts and are usually worse before menstrual periods start. Symptoms usually get better toward the end ofmenstrual periods. How is this diagnosed? This condition is diagnosed based on your medical history and a physical exam of your breasts. You may also have tests, such as: A breast X-ray (mammogram). Ultrasound. MRI. Removing a small sample of tissue from the breast for tests (breast biopsy). This may be done if your health care provider thinks that something else may be causing changes in your breasts. How is this treated? Often, treatment is not needed for this condition. In some cases, however, treatment may be needed, including: Taking over-the-counter pain medicines to help  relieve pain or discomfort. Limiting or avoiding caffeine. Foods and beverages that contain caffeine include chocolate, soda, coffee, and tea. Reducing sugar and fat in your diet. Treatment may also include: A procedure to remove fluid from a cyst that is causing pain (fine needle aspiration). Surgery to remove a cyst that is large or tender or does not go away. Medicines that may lower the amount of female hormones. Follow these instructions at home: Self care Check your breasts after every menstrual period. If you do not have menstrual periods, check your breasts on the first day of every month. Feel for changes in your breasts, such as: More tenderness. A new growth. A change in size. A change in an existing lump. General instructions Take over-the-counter and prescription medicines only as told by your health care provider. Wear a well-fitting support or sports bra, especially when exercising. If told by your health care provider, decrease or avoid caffeine, fat, and sugar in your diet. Keep all follow-up visits as told by your health care provider. This is important. Contact a health care provider if: You have fluid leaking from your nipple, especially if it is bloody. You have new lumps or bumps in your breast. Your breast becomes enlarged, red, and painful. You have areas of your breast that pucker inward. Your nipple appears flat or indented. Get help right away if: You have redness of your breast and the redness is spreading. Summary Fibrocystic breast changes are changes in breast tissue that can cause breasts to become swollen, lumpy, or painful. This condition may be related to the female hormones estrogen and progesterone. With this condition, it is important to examine your breasts  after every menstrual period. If you do not have menstrual periods, check your breasts on the first day of every month. This information is not intended to replace advice given to you by your  health care provider. Make sure you discuss any questions you have with your healthcare provider. Document Revised: 07/06/2019 Document Reviewed: 07/06/2019 Elsevier Patient Education  2022 [employer].

## 2021-03-01 ENCOUNTER — Other Ambulatory Visit: Payer: Self-pay

## 2021-03-01 ENCOUNTER — Encounter (HOSPITAL_COMMUNITY): Payer: Self-pay

## 2021-03-01 ENCOUNTER — Ambulatory Visit (HOSPITAL_COMMUNITY)
Admission: EM | Admit: 2021-03-01 | Discharge: 2021-03-01 | Disposition: A | Discharge status: Home / Self Care | Payer: PRIVATE HEALTH INSURANCE | Attending: Emergency Medicine | Admitting: Emergency Medicine

## 2021-03-01 DIAGNOSIS — M62838 Other muscle spasm: Secondary | ICD-10-CM

## 2021-03-01 MED ORDER — METHOCARBAMOL 500 MG PO TABS
500.0000 mg | ORAL_TABLET | Freq: Two times a day (BID) | ORAL | 0 refills | Status: DC
Start: 2021-03-01 — End: 2021-03-20

## 2021-03-01 NOTE — ED Provider Notes (Signed)
[facility]    CSN: [id number] Arrival date & time: 03/01/21  0801      History   Chief Complaint Chief Complaint  Patient presents with   Neck Pain    HPI [name] [name] is a 25 y.o. female.   Awaken on sun with rt side of neck pain more so on the side of her neck. Pain with turning of the neck to opposite side. Denies any injury. No midline pain. Has not taken anything. No sob, has not taken anything pta    Past Medical History:  Diagnosis Date   Anemia    Fibroadenoma of left breast    needs follow up 1/20   Fibroid    Headache    History of chicken pox    Medical history non-contributory    Streptococcus B carrier or suspected carrier    Thyroid disease     Patient Active Problem List   Diagnosis Date Noted   Pelvic pain in female 10/22/2019   Acquired hypothyroidism 01/01/2019   Mastoiditis 11/13/2018   Chronic migraine 12/13/2015   Low back pain 12/13/2015   Morbid obesity ([place]) 11/01/2015    Past Surgical History:  Procedure Laterality Date   NO PAST SURGERIES      OB History     Gravida  2   Para  2   Term  2   Preterm  0   AB  0   Living  2      SAB  0   IAB  0   Ectopic  0   Multiple  0   Live Births  2            Home Medications    Prior to Admission medications   Medication Sig Start Date End Date Taking? Authorizing Provider  methocarbamol (ROBAXIN) 500 MG tablet Take 1 tablet (500 mg total) by mouth 2 (two) times daily. 03/01/21  Yes Marney Setting, NP  acetaminophen (TYLENOL) 325 MG tablet Take 650 mg by mouth every 6 (six) hours as needed for mild pain or fever.    [provider]    Family History Family History  Problem Relation Age of Onset   Healthy Mother    Hypertension Father    Hyperlipidemia Father    Mental retardation Brother    COPD Maternal Grandmother    Breast cancer Maternal Grandmother    Diabetes Maternal Grandmother    Bladder Cancer Paternal Grandmother      Social History Social History   Tobacco Use   Smoking status: Former    Types: Cigarettes    Quit date: 10/25/2010    Years since quitting: 10.3   Smokeless tobacco: Never  Vaping Use   Vaping Use: Never used  Substance Use Topics   Alcohol use: No    Comment: socially   Drug use: No     Allergies   Vancomycin   Review of Systems Review of Systems  Constitutional: Negative.   HENT: Negative.    Respiratory: Negative.    Cardiovascular: Negative.   Gastrointestinal: Negative.   Musculoskeletal:  Positive for neck pain and neck stiffness.  Neurological: Negative.     Physical Exam Triage Vital Signs ED Triage Vitals  Enc Vitals Group     BP 03/01/21 0816 123/78     Pulse Rate 03/01/21 0816 78     Resp 03/01/21 0816 18     Temp 03/01/21 0816 99.1 F (37.3 C)  Temp Source 03/01/21 0816 Oral     SpO2 03/01/21 0816 97 %     Weight --      Height --      Head Circumference --      Peak Flow --      Pain Score 03/01/21 0815 0     Pain Loc --      Pain Edu? --      Excl. in [place]? --    No data found.  Updated Vital Signs BP 123/78 (BP Location: Right Arm)   Pulse 78   Temp 99.1 F (37.3 C) (Oral)   Resp 18   LMP 01/25/2021 (Exact Date)   SpO2 97%   Visual Acuity     Physical Exam Constitutional:      Appearance: She is obese.  Eyes:     Pupils: Pupils are equal, round, and reactive to light.  Neck:     Comments: Tenderness to sternoclomastoid muscle with turning neck lt side. Full rom  Musculoskeletal:     Cervical back: Normal range of motion. Tenderness present.  Skin:    General: Skin is warm.  Neurological:     General: No focal deficit present.     Mental Status: She is alert.     UC Treatments / Results  Labs (all labs ordered are listed, but only abnormal results are displayed) Labs Reviewed - No data to display  EKG   Radiology No results found.  Procedures Procedures (including critical care time)  Medications  Ordered in UC Medications - No data to display  Initial Impression / Assessment and Plan / UC Course  I have reviewed the triage vital signs and the nursing notes.  Pertinent labs & imaging results that were available during my care of the patient were reviewed by me and considered in my medical decision making (see chart for details).     Use heat as needed  It appears to be more muscle related  Take nsaids  Avoid driving while taking muscle relaxer   Final Clinical Impressions(s) / UC Diagnoses   Final diagnoses:  Muscle spasms of neck  Trapezius muscle spasm     Discharge Instructions      Use heat as needed  It appears to be more muscle related  Take nsaids  Avoid driving while taking muscle relaxer      ED Prescriptions     Medication Sig Dispense Auth. Provider   methocarbamol (ROBAXIN) 500 MG tablet Take 1 tablet (500 mg total) by mouth 2 (two) times daily. 20 tablet Marney Setting, NP      PDMP not reviewed this encounter.   Marney Setting, NP 03/01/21 [phone]

## 2021-03-01 NOTE — ED Triage Notes (Signed)
Pt in with c/o neck pain that started on Saturday  States whenever she moves her neck it feels like a pinched nerve on the right ride at the base of her neck  Pt has tried tylenol and a massage gun with no relief

## 2021-03-01 NOTE — Discharge Instructions (Addendum)
Use heat as needed  It appears to be more muscle related  Take nsaids  Avoid driving while taking muscle relaxer

## 2021-03-14 ENCOUNTER — Telehealth: Payer: Self-pay

## 2021-03-14 NOTE — Telephone Encounter (Signed)
  Encourage patient to contact the pharmacy for refills or they can request refills through [place]:   01/20/2021 Please schedule appointment if longer than 1 year  NEXT APPOINTMENT DATE: 03/20/2021 (Has a TOC scheduled for November)  MEDICATION: levothyroxine (SYNTHROID) 100 MCG tablet ID:[id number]  DISCONTINUED  PHARMACY:[facility] [phone] - [place], [place] - [address] AT [address]  Let patient know to contact pharmacy at the end of the day to make sure medication is ready.  Please notify patient to allow 48-72 hours to process

## 2021-03-14 NOTE — Telephone Encounter (Signed)
Error

## 2021-03-15 MED ORDER — LEVOTHYROXINE SODIUM 100 MCG PO TABS
100.0000 ug | ORAL_TABLET | Freq: Every day | ORAL | 0 refills | Status: DC
Start: 2021-03-15 — End: 2021-03-21

## 2021-03-15 NOTE — Telephone Encounter (Signed)
Patient states she wasn't taking it appropriately and ran out. Now is having side effects (joint pain) from not taking it, and is wanting a refill to last her until she comes in to see Sam on Monday for a blood draw.

## 2021-03-15 NOTE — Telephone Encounter (Signed)
Left message on voicemail to call office. Need to know if pt is still taking Levothyroxine? Looks like it was discontinued.

## 2021-03-15 NOTE — Telephone Encounter (Signed)
Spoke to pt told her Rx for Levothyroxine 100 mcg sent to pharmacy. Pt verbalized understanding.

## 2021-03-20 ENCOUNTER — Ambulatory Visit (INDEPENDENT_AMBULATORY_CARE_PROVIDER_SITE_OTHER): Payer: PRIVATE HEALTH INSURANCE | Admitting: Physician Assistant

## 2021-03-20 ENCOUNTER — Encounter: Payer: Self-pay | Admitting: Physician Assistant

## 2021-03-20 ENCOUNTER — Other Ambulatory Visit: Payer: Self-pay

## 2021-03-20 VITALS — BP 102/70 | HR 74 | Temp 98.1°F | Ht 68.0 in | Wt 250.5 lb

## 2021-03-20 DIAGNOSIS — Z720 Tobacco use: Secondary | ICD-10-CM

## 2021-03-20 DIAGNOSIS — E039 Hypothyroidism, unspecified: Secondary | ICD-10-CM | POA: Diagnosis not present

## 2021-03-20 DIAGNOSIS — N6310 Unspecified lump in the right breast, unspecified quadrant: Secondary | ICD-10-CM | POA: Diagnosis not present

## 2021-03-20 DIAGNOSIS — R6889 Other general symptoms and signs: Secondary | ICD-10-CM | POA: Diagnosis not present

## 2021-03-20 DIAGNOSIS — N6320 Unspecified lump in the left breast, unspecified quadrant: Secondary | ICD-10-CM

## 2021-03-20 DIAGNOSIS — R519 Headache, unspecified: Secondary | ICD-10-CM | POA: Insufficient documentation

## 2021-03-20 LAB — CBC WITH DIFFERENTIAL/PLATELET
Basophils Absolute: 0.1 10*3/uL (ref 0.0–0.1)
Basophils Relative: 0.7 % (ref 0.0–3.0)
Eosinophils Absolute: 0.2 10*3/uL (ref 0.0–0.7)
Eosinophils Relative: 2.9 % (ref 0.0–5.0)
HCT: 42.1 % (ref 36.0–46.0)
Hemoglobin: 14 g/dL (ref 12.0–15.0)
Lymphocytes Relative: 37.8 % (ref 12.0–46.0)
Lymphs Abs: 2.8 10*3/uL (ref 0.7–4.0)
MCHC: 33.2 g/dL (ref 30.0–36.0)
MCV: 89.7 fl (ref 78.0–100.0)
Monocytes Absolute: 0.4 10*3/uL (ref 0.1–1.0)
Monocytes Relative: 5.4 % (ref 3.0–12.0)
Neutro Abs: 4 10*3/uL (ref 1.4–7.7)
Neutrophils Relative %: 53.2 % (ref 43.0–77.0)
Platelets: 265 10*3/uL (ref 150.0–400.0)
RBC: 4.7 Mil/uL (ref 3.87–5.11)
RDW: 13.6 % (ref 11.5–15.5)
WBC: 7.4 10*3/uL (ref 4.0–10.5)

## 2021-03-20 LAB — COMPREHENSIVE METABOLIC PANEL
ALT: 37 U/L — ABNORMAL HIGH (ref 0–35)
AST: 28 U/L (ref 0–37)
Albumin: 4.6 g/dL (ref 3.5–5.2)
Alkaline Phosphatase: 57 U/L (ref 39–117)
BUN: 13 mg/dL (ref 6–23)
CO2: 22 mEq/L (ref 19–32)
Calcium: 9.8 mg/dL (ref 8.4–10.5)
Chloride: 104 mEq/L (ref 96–112)
Creatinine, Ser: 0.73 mg/dL (ref 0.40–1.20)
GFR: 112.61 mL/min (ref >60.00)
Glucose, Bld: 75 mg/dL (ref 70–99)
Potassium: 4.4 mEq/L (ref 3.5–5.1)
Sodium: 137 mEq/L (ref 135–145)
Total Bilirubin: 0.5 mg/dL (ref 0.2–1.2)
Total Protein: 7.4 g/dL (ref 6.0–8.3)

## 2021-03-20 LAB — T4, FREE: Free T4: 0.71 ng/dL (ref 0.60–1.60)

## 2021-03-20 LAB — TSH: TSH: 8.11 u[IU]/mL — ABNORMAL HIGH (ref 0.35–5.50)

## 2021-03-20 NOTE — Progress Notes (Signed)
[name] [name] is a 25 y.o. female is here for: [facility]  I acted as a Education administrator for [employer], PA-C [name] Pickler, LPN   History of Present Illness:   Chief Complaint  Patient presents with   Transfer of care   Hypothyroidism    HPI  Pt is here today for transfer of care from [name] [name].   Hypothyroidism Currently taking Levothyroxine 100 mcg daily. She is needing TSH checked today. Pt has been off medication x 4 months. Denies fatigue, no hair or nail changes, heat/cold intolerance, weight change, palpitations or nervousness.   Throat finding; Tobacco Abuse 2 weeks ago she noticed that she had a small "lump" in the back of her throat. Denies pain, hemoptysis, productive cough. Since Jan she has been smoking daily -- about 5 cigarettes per day.  Has admitted health anxiety. Former smoker, quit for awhile and is now smoking regularly.   Bilateral breast lumps Had an u/s of left breast in Mar 22 that showed benign fibroadenoma. She feels like it may have enlarged and that she has also has a lump in her right breast. She would like imaging. Does have breast cancer in grandmother.   Health Maintenance Due  Topic Date Due   Pneumococcal Vaccine 20-78 Years old (1 - PCV) Never done   PAP-Cervical Cytology Screening  04/02/2021   PAP SMEAR-Modifier  04/02/2021    Past Medical History:  Diagnosis Date   Anemia    Fibroadenoma of left breast    needs follow up 1/20   Fibroid    Headache    History of chicken pox    Medical history non-contributory    Streptococcus B carrier or suspected carrier    Thyroid disease      Social History   Tobacco Use   Smoking status: Every Day    Types: Cigarettes    Last attempt to quit: 10/25/2010    Years since quitting: 10.4   Smokeless tobacco: Never   Tobacco comments:    Smoking 5 cigarettes a day  Vaping Use   Vaping Use: Never used  Substance Use Topics   Alcohol use: No    Comment: socially   Drug use: No    Past  Surgical History:  Procedure Laterality Date   NO PAST SURGERIES      Family History  Problem Relation Age of Onset   Healthy Mother    Mental retardation Brother    COPD Paternal Grandmother    Bladder Cancer Paternal Grandmother    Diabetes Paternal Great-grandmother     PMHx, SurgHx, SocialHx, FamHx, Medications, and Allergies were reviewed in the Visit Navigator and updated as appropriate.   Patient Active Problem List   Diagnosis Date Noted   Frequent headaches 03/20/2021   Hypothyroidism 01/01/2019   Abnormal liver function tests 06/06/2018   Anemia 02/07/2018   Uterine leiomyoma 10/09/2017   Fibroadenoma of left breast 08/13/2016   Chronic back pain 12/13/2015   Obese 11/01/2015    Social History   Tobacco Use   Smoking status: Every Day    Types: Cigarettes    Last attempt to quit: 10/25/2010    Years since quitting: 10.4   Smokeless tobacco: Never   Tobacco comments:    Smoking 5 cigarettes a day  Vaping Use   Vaping Use: Never used  Substance Use Topics   Alcohol use: No    Comment: socially   Drug use: No    Current Medications and Allergies:  Current Outpatient Medications:    acetaminophen (TYLENOL) 325 MG tablet, Take 650 mg by mouth every 6 (six) hours as needed for mild pain or fever., Disp: , Rfl:    levothyroxine (SYNTHROID) 100 MCG tablet, Take 1 tablet (100 mcg total) by mouth daily., Disp: 30 tablet, Rfl: 0   Allergies  Allergen Reactions   Vancomycin Itching    Review of Systems   ROS Negative unless otherwise specified per HPI.  Vitals:   Vitals:   03/20/21 1311  BP: 102/70  Pulse: 74  Temp: 98.1 F (36.7 C)  TempSrc: Temporal  SpO2: 97%  Weight: 250 lb 8 oz (113.6 kg)  Height: '5\' 8"'$  (1.727 m)     Body mass index is 38.09 kg/m.   Physical Exam:    Physical Exam Vitals and nursing note reviewed.  Constitutional:      General: She is not in acute distress.    Appearance: She is well-developed. She is not  ill-appearing or toxic-appearing.  HENT:     Head: Normocephalic and atraumatic.     Right Ear: Tympanic membrane, ear canal and external ear normal. Tympanic membrane is not erythematous, retracted or bulging.     Left Ear: Tympanic membrane, ear canal and external ear normal. Tympanic membrane is not erythematous, retracted or bulging.     Nose: Nose normal.     Right Sinus: No maxillary sinus tenderness or frontal sinus tenderness.     Left Sinus: No maxillary sinus tenderness or frontal sinus tenderness.     Mouth/Throat:     Pharynx: Uvula midline. No posterior oropharyngeal erythema.  Eyes:     General: Lids are normal.     Conjunctiva/sclera: Conjunctivae normal.  Neck:     Trachea: Trachea normal.  Cardiovascular:     Rate and Rhythm: Normal rate and regular rhythm.     Pulses: Normal pulses.     Heart sounds: Normal heart sounds, S1 normal and S2 normal.     Comments: No LE edema Pulmonary:     Effort: Pulmonary effort is normal.     Breath sounds: Normal breath sounds. No decreased breath sounds, wheezing, rhonchi or rales.  Lymphadenopathy:     Cervical: No cervical adenopathy.  Skin:    General: Skin is warm and dry.  Neurological:     Mental Status: She is alert.     GCS: GCS eye subscore is 4. GCS verbal subscore is 5. GCS motor subscore is 6.  Psychiatric:        Speech: Speech normal.        Behavior: Behavior normal. Behavior is cooperative.     Assessment and Plan:    Oda was seen today for transfer of care and hypothyroidism.  Diagnoses and all orders for this visit:  Hypothyroidism, unspecified type Update TSH and free T4 today. Will re-initiate thyroid medication as indicated. Follow-up will be based on results. -     TSH -     CBC with Differential/Platelet -     Comprehensive metabolic panel -     T4, free  Bilateral breast lump Referral made to [facility]. -     US BREAST LTD UNI LEFT INC AXILLA; Future -     US BREAST LTD UNI RIGHT INC  AXILLA; Future  Throat symptom No abnormalities on my exam. Continue to monitor and follow-up if symptoms persist.  Tobacco abuse Encouraged cessation.  CMA or LPN served as scribe during this visit. History, Physical, and Plan performed by medical  provider. The above documentation has been reviewed and is accurate and complete.  Inda Coke, PA-C [place] [place] 03/20/2021  Follow-up: No follow-ups on file.

## 2021-03-20 NOTE — Patient Instructions (Signed)
It was great to see you!  Update blood work today  Take thyroid medication consistently   You will be contacted by [facility] about your breast imaging, if you do not hear anything in a few weeks call them [phone]  Stop smoking!!  Take care,  Inda Coke PA-C

## 2021-03-21 ENCOUNTER — Other Ambulatory Visit: Payer: Self-pay | Admitting: Physician Assistant

## 2021-03-21 DIAGNOSIS — E039 Hypothyroidism, unspecified: Secondary | ICD-10-CM

## 2021-03-21 MED ORDER — LEVOTHYROXINE SODIUM 50 MCG PO TABS
50.0000 ug | ORAL_TABLET | Freq: Every day | ORAL | 0 refills | Status: DC
Start: 2021-03-21 — End: 2021-06-28

## 2021-05-26 ENCOUNTER — Ambulatory Visit (INDEPENDENT_AMBULATORY_CARE_PROVIDER_SITE_OTHER): Payer: Self-pay | Admitting: Physician Assistant

## 2021-05-26 ENCOUNTER — Other Ambulatory Visit: Payer: Self-pay | Admitting: *Deleted

## 2021-05-26 ENCOUNTER — Encounter: Payer: Self-pay | Admitting: Physician Assistant

## 2021-05-26 ENCOUNTER — Other Ambulatory Visit: Payer: Self-pay

## 2021-05-26 VITALS — BP 112/80 | HR 90 | Temp 98.5°F | Ht 68.0 in | Wt 257.2 lb

## 2021-05-26 DIAGNOSIS — N761 Subacute and chronic vaginitis: Secondary | ICD-10-CM

## 2021-05-26 MED ORDER — NYSTATIN 100000 UNIT/GM EX OINT: TOPICAL_OINTMENT | CUTANEOUS | 0 refills | Status: DC

## 2021-05-26 NOTE — Patient Instructions (Signed)
It was great to see you!  Trial nystatin ointment  May also consider desitin (zinc oxide ointment)  Take care,  Inda Coke PA-C

## 2021-05-26 NOTE — Progress Notes (Signed)
[name] [name] is a 25 y.o. female here for vaginal itching and swelling.   History of Present Illness:   Chief Complaint  Patient presents with   Vaginal Itching    Pt c/o vaginal clear discharge and itching x 3 weeks. She has tried Engineer, technical sales and vagisil.    HPI   Vaginitis [name] [name] presents with c/o vaginal itching that has been onset for three weeks. She reports experiencing clear vaginal discharge as well as swelling. In an attempt to treat her sx she has taken monistat and vagisil which provided no relief. [name] [name] also admits to taking some left over amoxicillin 125 mg from an ear infection to treat herself but proved non helpful. Denies new sexual partners, pelvic pain, diarrhea, vaginal bleeding, hygenic product changes or concerns for pregnancy.   Past Medical History:  Diagnosis Date   Anemia    Fibroadenoma of left breast    needs follow up 1/20   Fibroid    Headache    History of chicken pox    Medical history non-contributory    Streptococcus B carrier or suspected carrier    Thyroid disease      Social History   Tobacco Use   Smoking status: Every Day    Types: Cigarettes    Last attempt to quit: 10/25/2010    Years since quitting: 10.5   Smokeless tobacco: Never   Tobacco comments:    Smoking 5 cigarettes a day  Vaping Use   Vaping Use: Never used  Substance Use Topics   Alcohol use: No    Comment: socially   Drug use: No    Past Surgical History:  Procedure Laterality Date   NO PAST SURGERIES      Family History  Problem Relation Age of Onset   Healthy Mother    Mental retardation Brother    COPD Paternal Grandmother    Bladder Cancer Paternal Grandmother    Diabetes Paternal Great-grandmother     Allergies  Allergen Reactions   Vancomycin Itching    Current Medications:   Current Outpatient Medications:    acetaminophen (TYLENOL) 325 MG tablet, Take 650 mg by mouth every 6 (six) hours as needed for mild pain or fever., Disp: , Rfl:     levothyroxine (SYNTHROID) 50 MCG tablet, Take 1 tablet (50 mcg total) by mouth daily., Disp: 90 tablet, Rfl: 0   Review of Systems:   ROS Negative unless otherwise specified per HPI.  Vitals:   Vitals:   05/26/21 1610  BP: 112/80  Pulse: 90  Temp: 98.5 F (36.9 C)  TempSrc: Temporal  SpO2: 98%  Weight: 257 lb 4 oz (116.7 kg)  Height: 5\' 8"  (1.727 m)     Body mass index is 39.11 kg/m.  Physical Exam:   Physical Exam Exam conducted with a chaperone present.  Constitutional:      Appearance: Normal appearance. She is well-developed.  HENT:     Head: Normocephalic and atraumatic.  Eyes:     General: Lids are normal.     Extraocular Movements: Extraocular movements intact.     Conjunctiva/sclera: Conjunctivae normal.  Pulmonary:     Effort: Pulmonary effort is normal.  Genitourinary:    Vagina: No signs of injury. No vaginal discharge or tenderness.     Cervix: No erythema.     Comments: Mild erythema at introitus No excoriations or lesions Musculoskeletal:        General: Normal range of motion.     Cervical back: Normal range  of motion and neck supple.  Skin:    General: Skin is warm and dry.  Neurological:     Mental Status: She is alert and oriented to person, place, and time.  Psychiatric:        Attention and Perception: Attention and perception normal.        Mood and Affect: Mood normal.        Behavior: Behavior normal.        Thought Content: Thought content normal.        Judgment: Judgment normal.    Assessment and Plan:   Subacute vaginitis - Plan: Cervicovaginal ancillary only, RPR, HIV Antibody (routine testing w rflx), Vaginitis/Vaginosis, DNA Probe, SureSwab Advanced Vaginitis Plus,TMA, SureSwab Advanced Vaginitis Plus,TMA  Unclear etiology No CMT or obvious infection Swab sent for STI, BV, yeast Trial topical nystatin or desitin/zinc oxide If results negative, and sx persist, will refer back to gyn   I,Havlyn C Ratchford,acting as a  scribe for [employer], PA.,have documented all relevant documentation on the behalf of Inda Coke, PA,as directed by  Inda Coke, PA while in the presence of Inda Coke, [place].   I, Inda Coke, [place], have reviewed all documentation for this visit. The documentation on 05/26/21 for the exam, diagnosis, procedures, and orders are all accurate and complete.   Inda Coke, PA-C

## 2021-05-29 ENCOUNTER — Other Ambulatory Visit: Payer: Self-pay | Admitting: Physician Assistant

## 2021-05-29 LAB — SURESWAB® ADVANCED VAGINITIS PLUS,TMA
C. trachomatis RNA, TMA: NOT DETECTED
CANDIDA SPECIES: NOT DETECTED
Candida glabrata: NOT DETECTED
N. gonorrhoeae RNA, TMA: NOT DETECTED
SURESWAB(R) ADV BACTERIAL VAGINOSIS(BV),TMA: POSITIVE — AB
TRICHOMONAS VAGINALIS (TV),TMA: DETECTED — AB

## 2021-05-29 LAB — RPR: RPR Ser Ql: NONREACTIVE

## 2021-05-29 LAB — HIV ANTIBODY (ROUTINE TESTING W REFLEX): HIV 1&2 Ab, 4th Generation: NONREACTIVE

## 2021-05-29 MED ORDER — METRONIDAZOLE 500 MG PO TABS: 500.0000 mg | ORAL_TABLET | Freq: Two times a day (BID) | ORAL | 0 refills | Status: AC

## 2021-05-31 ENCOUNTER — Encounter: Payer: Self-pay | Admitting: Physician Assistant

## 2021-06-01 ENCOUNTER — Ambulatory Visit: Payer: Self-pay

## 2021-06-01 ENCOUNTER — Ambulatory Visit (INDEPENDENT_AMBULATORY_CARE_PROVIDER_SITE_OTHER): Payer: Self-pay | Admitting: Family Medicine

## 2021-06-01 ENCOUNTER — Other Ambulatory Visit: Payer: Self-pay

## 2021-06-01 VITALS — BP 132/82 | HR 87 | Ht 68.0 in | Wt 259.6 lb

## 2021-06-01 DIAGNOSIS — M25511 Pain in right shoulder: Secondary | ICD-10-CM

## 2021-06-01 NOTE — Patient Instructions (Addendum)
Thank you for coming in today.   Please complete the exercises that the athletic trainer went over with you:  View at [email or web address] using code: [id number]  Recheck back as needed

## 2021-06-01 NOTE — Progress Notes (Signed)
   I, Wendy Poet, LAT, ATC, am serving as scribe for Dr. Lynne Leader.  [name] [name] is a 25 y.o. female who presents to [place] at [facility] today for f/u of R shoulder pain that began in Dec 2020 while working out at [employer] w/ her person Clinical research associate.  She was last seen by Dr. Georgina Snell on 08/04/20 w/ R ant shoulder pain and was provided w/ a HEP focusing on RC and periscapular strengthening as she was not able to afford PT due to lack of health insurance.  Today, pt reports R shoulder started hurting again 3-4 weeks ago that started the day after a workout w/ heavy reps of push-ups and kettle bells. Pt locates pain to the superior aspect of her R shoulder, w/ radiating pain to hand/fingers at night.   Pertinent review of systems: No fevers or chills  Relevant historical information: Hypothyroidism   Exam:  BP 132/82   Pulse 87   Ht 5\' 8"  (1.727 m)   Wt 259 lb 9.6 oz (117.8 kg)   LMP 05/12/2021   SpO2 98%   BMI 39.47 kg/m  General: Well Developed, well nourished, and in no acute distress.   MSK: Well-developed right shoulder musculature normal-appearing otherwise. Normal shoulder motion pain with abduction and internal rotation. Strength intact abduction external and internal rotation.  Pain with abduction. Mildly positive Hawkins and Neer's test.  Positive empty can test. Positive crossover arm compression test. Mildly positive Yergason's and speeds test. Pulses capillary fill and sensation are intact distally.     Lab and Radiology Results  Diagnostic Limited MSK Ultrasound of: Right shoulder Ultrasound accuracy limited by body habitus Biceps tendon intact normal-appearing Subscapularis tendon intact normal-appearing Supraspinatus tendon is intact. Trace subacromial bursitis present. Infraspinatus tendon is intact. AC joint normal-appearing although painful to palpation with ultrasound probe. Impression: Probable AC irritation and subacromial bursitis  and impingement      Assessment and Plan: 25 y.o. female with right shoulder pain after weightlifting.  Plan for home exercise program taught in clinic today by ATC focused on rotator cuff strengthening and shoulder stabilization.  This is a similar incident that happened last December.  If not improving consider either injection or formal physical therapy.  Recheck back as needed.   PDMP not reviewed this encounter. Orders Placed This Encounter  Procedures   [place] LIMITED JOINT SPACE STRUCTURES UP RIGHT(NO LINKED CHARGES)    Standing Status:   Future    Number of Occurrences:   1    Standing Expiration Date:   11/30/2021    Order Specific Question:   Reason for Exam (SYMPTOM  OR DIAGNOSIS REQUIRED)    Answer:   right shoulder pain    Order Specific Question:   Preferred imaging location?    Answer:   [facility]   No orders of the defined types were placed in this encounter.    Discussed warning signs or symptoms. Please see discharge instructions. Patient expresses understanding.   The above documentation has been reviewed and is accurate and complete Lynne Leader, M.D.

## 2021-06-10 ENCOUNTER — Other Ambulatory Visit: Payer: Self-pay

## 2021-06-10 ENCOUNTER — Encounter (HOSPITAL_COMMUNITY): Payer: Self-pay

## 2021-06-10 ENCOUNTER — Emergency Department (HOSPITAL_COMMUNITY)
Admission: EM | Admit: 2021-06-10 | Discharge: 2021-06-10 | Disposition: A | Discharge status: Home / Self Care | Payer: Self-pay | Attending: Emergency Medicine | Admitting: Emergency Medicine

## 2021-06-10 ENCOUNTER — Emergency Department (HOSPITAL_COMMUNITY): Payer: Self-pay

## 2021-06-10 DIAGNOSIS — R1010 Upper abdominal pain, unspecified: Secondary | ICD-10-CM | POA: Insufficient documentation

## 2021-06-10 DIAGNOSIS — E039 Hypothyroidism, unspecified: Secondary | ICD-10-CM | POA: Insufficient documentation

## 2021-06-10 DIAGNOSIS — R110 Nausea: Secondary | ICD-10-CM

## 2021-06-10 DIAGNOSIS — F17210 Nicotine dependence, cigarettes, uncomplicated: Secondary | ICD-10-CM | POA: Insufficient documentation

## 2021-06-10 DIAGNOSIS — Z79899 Other long term (current) drug therapy: Secondary | ICD-10-CM | POA: Insufficient documentation

## 2021-06-10 DIAGNOSIS — M25522 Pain in left elbow: Secondary | ICD-10-CM | POA: Insufficient documentation

## 2021-06-10 LAB — TROPONIN I (HIGH SENSITIVITY): Troponin I (High Sensitivity): <2 ng/L (ref <18)

## 2021-06-10 LAB — COMPREHENSIVE METABOLIC PANEL
ALT: 70 U/L — ABNORMAL HIGH (ref 0–44)
AST: 37 U/L (ref 15–41)
Albumin: 4.2 g/dL (ref 3.5–5.0)
Alkaline Phosphatase: 57 U/L (ref 38–126)
Anion gap: 6 (ref 5–15)
BUN: 10 mg/dL (ref 6–20)
CO2: 24 mmol/L (ref 22–32)
Calcium: 9 mg/dL (ref 8.9–10.3)
Chloride: 108 mmol/L (ref 98–111)
Creatinine, Ser: 0.59 mg/dL (ref 0.44–1.00)
GFR, Estimated: >60 mL/min (ref >60)
Glucose, Bld: 97 mg/dL (ref 70–99)
Potassium: 3.9 mmol/L (ref 3.5–5.1)
Sodium: 138 mmol/L (ref 135–145)
Total Bilirubin: 0.6 mg/dL (ref 0.3–1.2)
Total Protein: 7.4 g/dL (ref 6.5–8.1)

## 2021-06-10 LAB — URINALYSIS, ROUTINE W REFLEX MICROSCOPIC
Bacteria, UA: NONE SEEN
Bilirubin Urine: NEGATIVE
Glucose, UA: NEGATIVE mg/dL
Hgb urine dipstick: NEGATIVE
Ketones, ur: NEGATIVE mg/dL
Leukocytes,Ua: NEGATIVE
Nitrite: NEGATIVE
Protein, ur: NEGATIVE mg/dL
Specific Gravity, Urine: 1.006 (ref 1.005–1.030)
pH: 7 (ref 5.0–8.0)

## 2021-06-10 LAB — CBC WITH DIFFERENTIAL/PLATELET
Abs Immature Granulocytes: 0.02 10*3/uL (ref 0.00–0.07)
Basophils Absolute: 0 10*3/uL (ref 0.0–0.1)
Basophils Relative: 1 %
Eosinophils Absolute: 0.2 10*3/uL (ref 0.0–0.5)
Eosinophils Relative: 3 %
HCT: 43.3 % (ref 36.0–46.0)
Hemoglobin: 14.5 g/dL (ref 12.0–15.0)
Immature Granulocytes: 0 %
Lymphocytes Relative: 31 %
Lymphs Abs: 2 10*3/uL (ref 0.7–4.0)
MCH: 29.8 pg (ref 26.0–34.0)
MCHC: 33.5 g/dL (ref 30.0–36.0)
MCV: 88.9 fL (ref 80.0–100.0)
Monocytes Absolute: 0.6 10*3/uL (ref 0.1–1.0)
Monocytes Relative: 9 %
Neutro Abs: 3.6 10*3/uL (ref 1.7–7.7)
Neutrophils Relative %: 56 %
Platelets: 270 10*3/uL (ref 150–400)
RBC: 4.87 MIL/uL (ref 3.87–5.11)
RDW: 12.9 % (ref 11.5–15.5)
WBC: 6.5 10*3/uL (ref 4.0–10.5)
nRBC: 0 % (ref 0.0–0.2)

## 2021-06-10 LAB — I-STAT BETA HCG BLOOD, ED (MC, WL, AP ONLY): I-stat hCG, quantitative: <5 m[IU]/mL (ref <5)

## 2021-06-10 LAB — LIPASE, BLOOD: Lipase: 40 U/L (ref 11–51)

## 2021-06-10 MED ORDER — OMEPRAZOLE 20 MG PO CPDR: 20.0000 mg | DELAYED_RELEASE_CAPSULE | Freq: Every day | ORAL | 0 refills | Status: DC

## 2021-06-10 NOTE — ED Provider Notes (Signed)
[name] DEPT Provider Note   CSN: [id number] Arrival date & time: 06/10/21  4287     History Chief Complaint  Patient presents with   Nausea    [name] [name] [name] is a 25 y.o. female who presents with intermittent upper abdominal [name] [name], [name] [name] [name], [name] left arm [name] [name] with nausea that woke her from her sleep at 2 AM.  No history of the same.  States she did not find results sleeping abnormally in her arm, [name] has never had any history of the same.  History of [name] [name] [name] [name] hypothyroidism.  Endorses loose stools for last few days but denies fevers or chills or known sick contacts.  I personally reviewed this patient's medical records.  She has history of obesity, uterine leiomyoma, [name] hypothyroidism noncompliant with her Synthroid.  She states that she does not take her levothyroxine because she believes her lifestyle changes will manage her hypothyroidism.  LMP 05/12/2021.  Patient is sexually active with her female partner but states there is no chance she could be pregnant.  She is not on any contraception.  HPI     Past Medical History:  Diagnosis Date   Anemia    Fibroadenoma of left breast    needs follow up 1/20   Fibroid    Headache    History of chicken pox    Medical history non-contributory    Streptococcus B carrier or suspected carrier    Thyroid disease     Patient Active Problem List   Diagnosis Date Noted   Frequent headaches 03/20/2021   Hypothyroidism 01/01/2019   Abnormal liver function tests 06/06/2018   Anemia 02/07/2018   Uterine leiomyoma 10/09/2017   Fibroadenoma of left breast 08/13/2016   Chronic back [name] [name] 12/13/2015   Obese 11/01/2015    Past Surgical History:  Procedure Laterality Date   NO PAST SURGERIES       OB History     Gravida  2   Para  2   Term  2   Preterm  0   [name] [name]  0   Living  2      SAB  0   IAB  0   Ectopic  0   Multiple  0   Live Births  2           Family History  Problem  Relation Age of Onset   Healthy Mother    Mental retardation Brother    COPD Paternal Grandmother    Bladder Cancer Paternal Grandmother    Diabetes Paternal Great-grandmother     Social History   Tobacco Use   Smoking status: Every Day    Types: Cigarettes    Last attempt to quit: 10/25/2010    Years since quitting: 10.6   Smokeless tobacco: Never   Tobacco comments:    Smoking 5 cigarettes a day  Vaping Use   Vaping Use: Never used  Substance Use Topics   Alcohol use: No    Comment: socially   Drug use: No    Home Medications Prior to Admission medications   Medication Sig Start Date End Date Taking? Authorizing Provider  acetaminophen (TYLENOL) 325 MG tablet Take 650 mg by mouth every 6 (six) hours as needed for mild [name] [name] or fever.   Yes [provider]  ibuprofen (ADVIL) 200 MG tablet Take 400 mg by mouth every 6 (six) hours as needed for mild [name] [name].   Yes [provider]  levothyroxine (SYNTHROID) 50 MCG tablet Take 1  tablet (50 mcg total) by mouth daily. Patient not taking: Reported on 06/10/2021 03/21/21   Inda Coke, PA  nystatin ointment (MYCOSTATIN) Apply to affected area 1-2 times daily Patient not taking: Reported on 06/10/2021 05/26/21   Inda Coke, PA    Allergies    Vancomycin  Review of Systems   Review of Systems  Constitutional: Negative.   HENT: Negative.    Eyes: Negative.   Respiratory: Negative.    Cardiovascular: Negative.   Gastrointestinal:  Positive for abdominal [name] [name] [name] nausea. Negative for anal bleeding, blood in stool, diarrhea [name] vomiting.  Genitourinary: Negative.   Musculoskeletal:  Positive for gait problem [name] myalgias.  Skin: Negative.   Hematological: Negative.   Psychiatric/Behavioral: Negative.     Physical Exam Updated Vital Signs BP 133/76   Pulse 85   Temp 97.6 F (36.4 C) (Oral)   Resp (!) 22   Ht 5\' 8"  (1.727 m)   Wt 113.4 kg   LMP 05/12/2021   SpO2 95%   BMI 38.01 kg/m   Physical  Exam Vitals [name] nursing note reviewed.  Constitutional:      Appearance: She is obese. She is not ill-appearing or toxic-appearing.  HENT:     Head: Normocephalic [name] atraumatic.     Nose: Nose normal. No congestion.     Mouth/Throat:     Mouth: Mucous membranes are moist.     Pharynx: Oropharynx is clear. Uvula midline. No oropharyngeal exudate or posterior oropharyngeal erythema.     Tonsils: No tonsillar exudate.  Eyes:     General: Lids are normal. Vision grossly intact.        Right eye: No discharge.        Left eye: No discharge.     Extraocular Movements: Extraocular movements intact.     Conjunctiva/sclera: Conjunctivae normal.     Pupils: Pupils are equal, round, [name] reactive to light.  Neck:     Trachea: Trachea [name] phonation normal.  Cardiovascular:     Rate [name] Rhythm: Normal rate [name] regular rhythm.     Pulses: Normal pulses.     Heart sounds: Normal heart sounds. No murmur heard. Pulmonary:     Effort: Pulmonary effort is normal. No tachypnea, bradypnea, accessory muscle usage, prolonged expiration or respiratory distress.     Breath sounds: Normal breath sounds. No wheezing or rales.  Chest:     Chest wall: No mass, lacerations, deformity, swelling, tenderness, crepitus or edema.  Abdominal:     General: Bowel sounds are normal. There is no distension.     Palpations: Abdomen is soft.     Tenderness: There is no abdominal tenderness. There is no right CVA tenderness, left CVA tenderness, guarding or rebound.  Musculoskeletal:        General: No deformity.       Arms:     Cervical back: Normal range of motion [name] neck supple. No edema, rigidity or crepitus. No [name] [name] with movement, spinous process tenderness or muscular tenderness.     Right lower leg: No edema.     Left lower leg: No edema.  Lymphadenopathy:     Cervical: No cervical adenopathy.  Skin:    General: Skin is warm [name] dry.     Capillary Refill: Capillary refill takes less than 2 seconds.      Findings: No rash.  Neurological:     General: No focal deficit present.     Mental Status: She is alert [name] oriented to person, place, [name] time.  Mental status is at baseline.     Gait: Gait is intact.  Psychiatric:        Mood [name] Affect: Mood normal.    ED Results / Procedures / Treatments   Labs (all labs ordered are listed, but only abnormal results are displayed) Labs Reviewed  COMPREHENSIVE METABOLIC PANEL - Abnormal; Notable for the following components:      Result Value   ALT 70 (*)    All other components within normal limits  URINALYSIS, ROUTINE W REFLEX MICROSCOPIC - Abnormal; Notable for the following components:   Color, Urine STRAW (*)    All other components within normal limits  CBC WITH DIFFERENTIAL/PLATELET  LIPASE, BLOOD  I-STAT BETA HCG BLOOD, ED ([facility], [facility], [facility] ONLY)  TROPONIN I (HIGH SENSITIVITY)    EKG EKG: Sinus rhythm  EKG Interpretation  Date/Time:  Saturday June 10 2021 10:56:49 EDT Ventricular Rate:  89 PR Interval:  163 QRS Duration: 91 QT Interval:  354 QTC Calculation: 431 R Axis:   84 Text Interpretation: Sinus rhythm Low voltage, precordial leads right axis deviation no longer present since last tracing Confirmed by Dorie Rank [phone]) on 06/10/2021 12:06:51 PM         Radiology DG Chest 2 View  Result Date: 06/10/2021 CLINICAL DATA:  Chest [name] [name] EXAM: CHEST - 2 VIEW COMPARISON:  08/16/2016 chest radiograph. FINDINGS: Stable cardiomediastinal silhouette with normal heart size. No pneumothorax. No pleural effusion. Lungs appear clear, with no acute consolidative airspace disease [name] no pulmonary edema. IMPRESSION: No active cardiopulmonary disease. Electronically Signed   By: Ilona Sorrel M.D.   On: 06/10/2021 12:23    Procedures Procedures   Medications Ordered in ED Medications - No data to display  ED Course  I have reviewed the triage vital signs [name] the nursing notes.  Pertinent labs & imaging results that were available  during my care of the patient were reviewed by me [name] considered in my medical decision making (see chart for details).    MDM Rules/Calculators/A&P                          25 year old female presents with concern for epigastric [name] [name], left arm [name] [name], nausea, [name] reflux symptoms since 2 AM.  Differential gnosis includes but is not limited to [name] [name] [name], PUD, ACS, PE, pleural effusion, pneumothorax, pneumonia.  Regarding her arm [name] [name] possible mild nerve injury from compression during sleep, myalgias from  Hypertensive on intake, vital signs otherwise normal.  Cardiopulmonary exam is normal, abdominal exam is benign.  Musculoskeletal exam is unremarkable with full range of motion of the shoulder, elbow, [name] wrist on the left hand with normal strength [name] sensation.  Patient is neurovascularly intact in all 4 extremities.  CBC unremarkable, CMP unremarkable, troponin negative, less than 2.  Patient is not pregnant [name] UA is reassuring.  Lipase is normal.  Chest x-ray negative for acute cardiopulmonary disease [name] EKG is a sinus rhythm.  Suspect patient symptoms are contributed to by her [name] [name] [name].  GI cocktail offered, patient declined.  Regarding her arm [name] [name] while the exact etiology of her symptoms remains unclear suspect possible mild compression injury after falling on it abnormally during her sleep.  She may follow-up with her PCP for this.  No further work-up warranted in the ER at this time.  Kahlani voiced understanding of her medical evaluation [name] treatment plan.  Each of her questions was answered to her expressed satisfaction.  Return precautions were given.  Patient is well-appearing, stable, [name] appropriate for discharge at this time.  This chart was dictated using voice recognition software, Dragon. Despite the best efforts of this provider to proofread [name] correct errors, errors may still occur which can change documentation meaning.   Final Clinical Impression(s) / ED Diagnoses Final diagnoses:   Nausea    Rx / DC Orders ED Discharge Orders     None        Aura Dials 06/10/21 1330    Dorie Rank, MD 06/11/21 [phone]

## 2021-06-10 NOTE — Discharge Instructions (Addendum)
You were seen in the ER today for your arm pain and nausea.  Your physical exam, vital signs, blood work, and chest x-ray were very reassuring as was your EKG.  There is no concern for any heart attack or any problem with your heart or your lungs at this time.  While the exact cause of your left arm pain remains unclear your physical exam was reassuring and there is no concern for emergent cause at this time.  Please follow-up with your primary care doctor specifically to discuss your thyroid dysfunction and return to the ER with any new severe symptoms.  You were prescribed a medication called omeprazole to take daily for the next month for your reflux.  Please discuss continuation of this medication as needed with your PCP.

## 2021-06-10 NOTE — ED Triage Notes (Signed)
Pt. Arrived POV c/o intermittent abdominal pain associated with L. Arm pain and nausea since 2 this morning.

## 2021-06-28 ENCOUNTER — Ambulatory Visit (INDEPENDENT_AMBULATORY_CARE_PROVIDER_SITE_OTHER): Payer: Self-pay | Admitting: Physician Assistant

## 2021-06-28 ENCOUNTER — Other Ambulatory Visit (HOSPITAL_COMMUNITY)
Admission: RE | Admit: 2021-06-28 | Discharge: 2021-06-28 | Disposition: A | Discharge status: Home / Self Care | Payer: Self-pay | Source: Ambulatory Visit | Attending: Physician Assistant | Admitting: Physician Assistant

## 2021-06-28 ENCOUNTER — Other Ambulatory Visit: Payer: Self-pay

## 2021-06-28 ENCOUNTER — Encounter: Payer: Self-pay | Admitting: Physician Assistant

## 2021-06-28 VITALS — BP 122/64 | HR 88 | Temp 98.3°F | Ht 68.0 in | Wt 265.1 lb

## 2021-06-28 DIAGNOSIS — R2231 Localized swelling, mass and lump, right upper limb: Secondary | ICD-10-CM

## 2021-06-28 DIAGNOSIS — F418 Other specified anxiety disorders: Secondary | ICD-10-CM

## 2021-06-28 DIAGNOSIS — Z202 Contact with and (suspected) exposure to infections with a predominantly sexual mode of transmission: Secondary | ICD-10-CM

## 2021-06-28 DIAGNOSIS — E039 Hypothyroidism, unspecified: Secondary | ICD-10-CM

## 2021-06-28 MED ORDER — LEVOTHYROXINE SODIUM 50 MCG PO TABS: 50.0000 ug | ORAL_TABLET | Freq: Every day | ORAL | 0 refills | Status: DC

## 2021-06-28 NOTE — Patient Instructions (Addendum)
It was great to see you!  We will update your testing today  You will be contacted about your ultrasound. If you do not hear anything by early next week, please call them: [phone]  Restart your thyroid medication and take every morning!  Let's follow-up in 1 month, sooner if you have concerns.  Take care,  Inda Coke PA-C

## 2021-06-28 NOTE — Progress Notes (Signed)
Subjective:    [name] [name] is a 25 y.o. female and is here for [facility] and for acute issues.  HPI  Health Maintenance Due  Topic Date Due   Pneumococcal Vaccine 69-89 Years old (1 - PCV) Never done   PAP-Cervical Cytology Screening  04/02/2021   PAP SMEAR-Modifier  04/02/2021   Hypothyroidism Currently prescribed levothyroxine 50 mcg daily. She is not taking this regularly. She is interested in starting to be compliant and taking more consistently.  STD exposure Was last seen by on 05/26/21 for vaginitis and was found to have trichomonas. She was given flagyl and she took this as directed. She states that she has since found out her spouse has been sleeping with prostitutes. She is asymptomatic at this time and would like repeat testing today.  R axilla mass Has had a lump in her R axilla x 2-3 months. Not painful and not getting enlarged. She has tried to express drainage from this without success. Does have hx of hidradenitis suppurativa that has required surgical intervention in the past. She is interested in u/s.  Situational anxiety Due to marital issues she is having increase in anxiety symptoms. Denies concerns with sleeping. Denies SI/HI. She is looking for a job and is having increased stress with this.   reports no history of alcohol use.  Tobacco Use: High Risk   Smoking Tobacco Use: Every Day   Smokeless Tobacco Use: Never   Passive Exposure: Not on file     Depression screen [facility] 2/9 06/28/2021  Decreased Interest 1  Down, Depressed, Hopeless 1  PHQ - 2 Score 2  Altered sleeping 0  Tired, decreased energy 2  Change in appetite 3  Feeling bad or failure about yourself  1  Trouble concentrating 0  Moving slowly or fidgety/restless 1  Suicidal thoughts 0  PHQ-9 Score 9  Difficult doing work/chores Somewhat difficult     Other providers/specialists: Patient Care Team: Inda Coke, [place] as PCP - General (Physician Assistant) Crawford Givens, MD as  Consulting Physician (Obstetrics and Gynecology)   PMHx, SurgHx, SocialHx, Medications, and Allergies were reviewed in the Visit Navigator and updated as appropriate.   Past Medical History:  Diagnosis Date   Anemia    Fibroadenoma of left breast    needs follow up 1/20   Fibroid    Headache    History of chicken pox    Medical history non-contributory    Streptococcus B carrier or suspected carrier    Thyroid disease      Past Surgical History:  Procedure Laterality Date   NO PAST SURGERIES       Family History  Problem Relation Age of Onset   Healthy Mother    Mental retardation Brother    COPD Paternal Grandmother    Bladder Cancer Paternal Grandmother    Diabetes Paternal Great-grandmother     Social History   Tobacco Use   Smoking status: Every Day    Types: Cigarettes    Last attempt to quit: 10/25/2010    Years since quitting: 10.6   Smokeless tobacco: Never   Tobacco comments:    Smoking 5 cigarettes a day  Vaping Use   Vaping Use: Never used  Substance Use Topics   Alcohol use: No    Comment: socially   Drug use: No    Review of Systems:   ROS Negative unless otherwise specified per HPI.  Objective:   BP 122/64   Pulse 88   Temp  98.3 F (36.8 C) (Temporal)   Ht 5\' 8"  (1.727 m)   Wt 265 lb 2 oz (120.3 kg)   SpO2 99%   BMI 40.31 kg/m   Physical Exam Constitutional:      Appearance: Normal appearance.  HENT:     Head: Normocephalic.  Cardiovascular:     Rate and Rhythm: Normal rate and regular rhythm.  Musculoskeletal:     Cervical back: Normal range of motion.  Skin:    General: Skin is warm.     Comments: R axillary mass, fixed, without significant TTP or fluctuation  Neurological:     General: No focal deficit present.     Mental Status: She is alert.  Psychiatric:        Attention and Perception: Attention normal.        Mood and Affect: Mood is anxious.        Behavior: Behavior is cooperative.     Assessment/Plan:    STD exposure Asymptomatic Will update STD panel, including RPR and HIV today Did briefly discuss PREP; declines today, will address at next visit if applicable Will treat based on results  Mass of right axilla Unclear etiology Will order u/s for further evaluation No obvious signs of infection or area amenable to I&D Information for [facility] mammography given if she hasn't heard anything by next week  Hypothyroidism, unspecified type Noncompliant Refilled medication, recommend taking levothyroxine 50 mcg daily x 4-6 weeks Follow-up in 4-6 weeks and we will check TSH at that time  Situational anxiety Uncontrolled however she declines intervention at this time Emotional support provided I discussed with patient that if they develop any SI, to tell someone immediately and seek medical attention. Follow-up in 4-6 weeks, sooner if concerns  Patient Counseling:   [x]     Nutrition: Stressed importance of moderation in sodium/caffeine intake, saturated fat and cholesterol, caloric balance, sufficient intake of fresh fruits, vegetables, fiber, calcium, iron, and 1 mg of folate supplement per day (for females capable of pregnancy).   [x]      Stressed the importance of regular exercise.    [x]     Substance Abuse: Discussed cessation/primary prevention of tobacco, alcohol, or other drug use; driving or other dangerous activities under the influence; availability of treatment for abuse.    [x]      Injury prevention: Discussed safety belts, safety helmets, smoke detector, smoking near bedding or upholstery.    [x]      Sexuality: Discussed sexually transmitted diseases, partner selection, use of condoms, avoidance of unintended pregnancy  and contraceptive alternatives.    [x]     Dental health: Discussed importance of regular tooth brushing, flossing, and dental visits.   [x]      Health maintenance and immunizations reviewed. Please refer to Health maintenance section.   I,Havlyn C  Ratchford,acting as a Education administrator for [employer], PA.,have documented all relevant documentation on the behalf of Inda Coke, PA,as directed by  Inda Coke, PA while in the presence of Inda Coke, [place].  I, Inda Coke, [place], have reviewed all documentation for this visit. The documentation on 06/28/21 for the exam, diagnosis, procedures, and orders are all accurate and complete.   Inda Coke, PA-C [place]

## 2021-06-30 LAB — URINE CYTOLOGY ANCILLARY ONLY
Chlamydia: NEGATIVE
Comment: NEGATIVE
Comment: NEGATIVE
Comment: NORMAL
Neisseria Gonorrhea: NEGATIVE
Trichomonas: NEGATIVE

## 2021-06-30 LAB — RPR: RPR Ser Ql: NONREACTIVE

## 2021-06-30 LAB — HIV ANTIBODY (ROUTINE TESTING W REFLEX): HIV 1&2 Ab, 4th Generation: NONREACTIVE

## 2021-08-15 ENCOUNTER — Encounter: Payer: Self-pay | Admitting: Physician Assistant

## 2021-09-25 ENCOUNTER — Ambulatory Visit
Admission: RE | Admit: 2021-09-25 | Discharge: 2021-09-25 | Disposition: A | Discharge status: Home / Self Care | Payer: No Typology Code available for payment source | Source: Ambulatory Visit | Attending: Physician Assistant | Admitting: Physician Assistant

## 2021-09-25 DIAGNOSIS — R2231 Localized swelling, mass and lump, right upper limb: Secondary | ICD-10-CM

## 2021-10-21 ENCOUNTER — Other Ambulatory Visit: Payer: Self-pay | Admitting: Physician Assistant

## 2021-11-24 ENCOUNTER — Encounter: Payer: Self-pay | Admitting: Physician Assistant

## 2021-11-24 ENCOUNTER — Other Ambulatory Visit (HOSPITAL_COMMUNITY)
Admission: RE | Admit: 2021-11-24 | Discharge: 2021-11-24 | Disposition: A | Discharge status: Home / Self Care | Payer: 59 | Source: Ambulatory Visit | Attending: Physician Assistant | Admitting: Physician Assistant

## 2021-11-24 ENCOUNTER — Ambulatory Visit (INDEPENDENT_AMBULATORY_CARE_PROVIDER_SITE_OTHER): Payer: 59 | Admitting: Physician Assistant

## 2021-11-24 VITALS — BP 130/86 | HR 85 | Temp 98.1°F | Ht 68.0 in | Wt 281.2 lb

## 2021-11-24 DIAGNOSIS — N930 Postcoital and contact bleeding: Secondary | ICD-10-CM

## 2021-11-24 DIAGNOSIS — E039 Hypothyroidism, unspecified: Secondary | ICD-10-CM

## 2021-11-24 DIAGNOSIS — N760 Acute vaginitis: Secondary | ICD-10-CM | POA: Diagnosis not present

## 2021-11-24 DIAGNOSIS — Z202 Contact with and (suspected) exposure to infections with a predominantly sexual mode of transmission: Secondary | ICD-10-CM

## 2021-11-24 LAB — TSH: TSH: 6.24 u[IU]/mL — ABNORMAL HIGH (ref 0.35–5.50)

## 2021-11-24 LAB — POCT URINE PREGNANCY: Preg Test, Ur: NEGATIVE

## 2021-11-24 NOTE — Patient Instructions (Signed)
It was great to see you! ? ?Please follow-up with gynecology about getting your PAP smear done and to follow-up on your bleeding. ? ?We will be in touch with all of your results. ? ? ?Take care, ? ?Inda Coke PA-C  ?

## 2021-11-24 NOTE — Progress Notes (Signed)
[name] [name] is a 26 y.o. female here for a new problem. ? ?History of Present Illness:  ? ?Chief Complaint  ?Patient presents with  ? STD testing  ? Vaginal Discharge  ?  Pt c/o vaginal discharge x 2 days, white discharge, having odor. HX BV  ? ? ?HPI ? ?Hypothyroidism ?Most recent TSH was 8.11 8 months ago.  She was recommended to follow-up to have her thyroid checked 4 to 6 weeks after that and has not done so.  She is currently taking Synthroid 50 mcg daily. ? ?Vaginal discharge ?Patient is concerned that she may have bacterial vaginitis.  She has had this before.  She is having odor and slight discharge.  She is currently sexually active.  Denies pelvic pain or severe pain with sex. ? ?Bleeding after sex ?Patient reports that she would also like STD testing because when she was last sexually active she had bleeding immediately after sex.  She reports that the sex was more intense than normal.  Denies any other symptoms since.  She is not due for her menses for another week or so.  She is due for Pap with gynecology. ? ? ?Past Medical History:  ?Diagnosis Date  ? Anemia   ? Fibroadenoma of left breast   ? needs follow up 1/20  ? Fibroid   ? Headache   ? History of chicken pox   ? Medical history non-contributory   ? Streptococcus B carrier or suspected carrier   ? Thyroid disease   ? ?  ?Social History  ? ?Tobacco Use  ? Smoking status: Every Day  ?  Types: Cigarettes  ?  Last attempt to quit: 10/25/2010  ?  Years since quitting: 11.0  ? Smokeless tobacco: Never  ? Tobacco comments:  ?  Smoking 5 cigarettes a day  ?Vaping Use  ? Vaping Use: Never used  ?Substance Use Topics  ? Alcohol use: No  ?  Comment: socially  ? Drug use: No  ? ? ?Past Surgical History:  ?Procedure Laterality Date  ? NO PAST SURGERIES    ? ? ?Family History  ?Problem Relation Age of Onset  ? Healthy Mother   ? Mental retardation Brother   ? COPD Paternal Grandmother   ? Bladder Cancer Paternal Grandmother   ? Diabetes Paternal  Great-grandmother   ? ? ?Allergies  ?Allergen Reactions  ? Azithromycin   ?  Other reaction(s): Unknown  ? Vancomycin Itching  ? ? ?Current Medications:  ? ?Current Outpatient Medications:  ?  acetaminophen (TYLENOL) 325 MG tablet, Take 650 mg by mouth every 6 (six) hours as needed for mild pain or fever., Disp: , Rfl:  ?  levothyroxine (SYNTHROID) 50 MCG tablet, TAKE 1 TABLET BY MOUTH EVERY DAY, Disp: 90 tablet, Rfl: 0  ? ?Review of Systems:  ? ?ROS ?Negative unless otherwise specified per HPI. ? ? ?Vitals:  ? ?Vitals:  ? 11/24/21 1130  ?BP: 130/86  ?Pulse: 85  ?Temp: 98.1 ?F (36.7 ?C)  ?TempSrc: Temporal  ?SpO2: 95%  ?Weight: 281 lb 4 oz (127.6 kg)  ?Height: '5\' 8"'$  (1.727 m)  ?   ?Body mass index is 42.76 kg/m?. ? ?Physical Exam:  ? ?Physical Exam ?Vitals and nursing note reviewed. Exam conducted with a chaperone present.  ?Constitutional:   ?   General: She is not in acute distress. ?   Appearance: She is well-developed. She is not ill-appearing or toxic-appearing.  ?Cardiovascular:  ?   Rate and Rhythm: Normal rate  and regular rhythm.  ?   Pulses: Normal pulses.  ?   Heart sounds: Normal heart sounds, S1 normal and S2 normal.  ?Pulmonary:  ?   Effort: Pulmonary effort is normal.  ?   Breath sounds: Normal breath sounds.  ?Genitourinary: ?   Vagina: Normal.  ?   Cervix: Normal.  ?Skin: ?   General: Skin is warm and dry.  ?Neurological:  ?   Mental Status: She is alert.  ?   GCS: GCS eye subscore is 4. GCS verbal subscore is 5. GCS motor subscore is 6.  ?Psychiatric:     ?   Speech: Speech normal.     ?   Behavior: Behavior normal. Behavior is cooperative.  ? ? ?Assessment and Plan:  ? ?1. STD exposure ?Updated STD panel today ?Recommend avoidance of sex until results have returned ?Recommend use of condoms ? ?2. Acute vaginitis ?Swab obtained today for testing for yeast and BV as well as STDs ?We will treat once results are returned ? ?3. Hypothyroidism, unspecified type ?Update TSH today and will adjust  levothyroxine 50 mcg accordingly ? ?4.  Postcoital bleeding ?Recommend patient follows up with her gynecologist ASAP for further evaluation and updating Pap smear. ? ?Inda Coke, PA-C ? ?

## 2021-11-27 ENCOUNTER — Other Ambulatory Visit: Payer: Self-pay | Admitting: Physician Assistant

## 2021-11-27 ENCOUNTER — Encounter: Payer: Self-pay | Admitting: Physician Assistant

## 2021-11-27 DIAGNOSIS — E039 Hypothyroidism, unspecified: Secondary | ICD-10-CM

## 2021-11-27 LAB — CERVICOVAGINAL ANCILLARY ONLY
Bacterial Vaginitis (gardnerella): POSITIVE — AB
Candida Glabrata: NEGATIVE
Candida Vaginitis: POSITIVE — AB
Chlamydia: POSITIVE — AB
Comment: NEGATIVE
Comment: NEGATIVE
Comment: NEGATIVE
Comment: NEGATIVE
Comment: NEGATIVE
Comment: NORMAL
Neisseria Gonorrhea: NEGATIVE
Trichomonas: NEGATIVE

## 2021-11-27 MED ORDER — FLUCONAZOLE 150 MG PO TABS: ORAL_TABLET | ORAL | 0 refills | Status: DC

## 2021-11-27 MED ORDER — LEVOTHYROXINE SODIUM 75 MCG PO TABS: 75.0000 ug | ORAL_TABLET | Freq: Every day | ORAL | 1 refills | Status: DC

## 2021-11-27 MED ORDER — DOXYCYCLINE HYCLATE 100 MG PO TABS: 100.0000 mg | ORAL_TABLET | Freq: Two times a day (BID) | ORAL | 0 refills | Status: DC

## 2021-11-27 MED ORDER — METRONIDAZOLE 500 MG PO TABS: 500.0000 mg | ORAL_TABLET | Freq: Two times a day (BID) | ORAL | 0 refills | Status: AC

## 2021-11-28 LAB — HIV ANTIBODY (ROUTINE TESTING W REFLEX): HIV 1&2 Ab, 4th Generation: NONREACTIVE

## 2021-11-28 LAB — RPR: RPR Ser Ql: NONREACTIVE

## 2021-12-19 ENCOUNTER — Other Ambulatory Visit: Payer: Self-pay | Admitting: Physician Assistant

## 2022-01-03 ENCOUNTER — Other Ambulatory Visit: Payer: Self-pay | Admitting: Physician Assistant

## 2022-03-01 ENCOUNTER — Ambulatory Visit (INDEPENDENT_AMBULATORY_CARE_PROVIDER_SITE_OTHER): Payer: 59 | Admitting: Physician Assistant

## 2022-03-01 ENCOUNTER — Encounter: Payer: Self-pay | Admitting: Physician Assistant

## 2022-03-01 ENCOUNTER — Other Ambulatory Visit: Payer: Self-pay | Admitting: Physician Assistant

## 2022-03-01 VITALS — BP 122/80 | HR 76 | Temp 98.3°F | Ht 68.0 in | Wt 293.0 lb

## 2022-03-01 DIAGNOSIS — R635 Abnormal weight gain: Secondary | ICD-10-CM | POA: Diagnosis not present

## 2022-03-01 DIAGNOSIS — R2231 Localized swelling, mass and lump, right upper limb: Secondary | ICD-10-CM

## 2022-03-01 DIAGNOSIS — M7989 Other specified soft tissue disorders: Secondary | ICD-10-CM

## 2022-03-01 DIAGNOSIS — E039 Hypothyroidism, unspecified: Secondary | ICD-10-CM

## 2022-03-01 DIAGNOSIS — N644 Mastodynia: Secondary | ICD-10-CM

## 2022-03-01 LAB — COMPREHENSIVE METABOLIC PANEL
ALT: 35 U/L (ref 0–35)
AST: 23 U/L (ref 0–37)
Albumin: 4.4 g/dL (ref 3.5–5.2)
Alkaline Phosphatase: 54 U/L (ref 39–117)
BUN: 14 mg/dL (ref 6–23)
CO2: 22 mEq/L (ref 19–32)
Calcium: 9.5 mg/dL (ref 8.4–10.5)
Chloride: 104 mEq/L (ref 96–112)
Creatinine, Ser: 0.69 mg/dL (ref 0.40–1.20)
GFR: 118.05 mL/min (ref >60.00)
Glucose, Bld: 76 mg/dL (ref 70–99)
Potassium: 3.9 mEq/L (ref 3.5–5.1)
Sodium: 136 mEq/L (ref 135–145)
Total Bilirubin: 0.4 mg/dL (ref 0.2–1.2)
Total Protein: 7.3 g/dL (ref 6.0–8.3)

## 2022-03-01 LAB — CBC WITH DIFFERENTIAL/PLATELET
Basophils Absolute: 0 10*3/uL (ref 0.0–0.1)
Basophils Relative: 0.5 % (ref 0.0–3.0)
Eosinophils Absolute: 0.2 10*3/uL (ref 0.0–0.7)
Eosinophils Relative: 2.3 % (ref 0.0–5.0)
HCT: 40.8 % (ref 36.0–46.0)
Hemoglobin: 13.6 g/dL (ref 12.0–15.0)
Lymphocytes Relative: 32.4 % (ref 12.0–46.0)
Lymphs Abs: 2.7 10*3/uL (ref 0.7–4.0)
MCHC: 33.3 g/dL (ref 30.0–36.0)
MCV: 89.9 fl (ref 78.0–100.0)
Monocytes Absolute: 0.5 10*3/uL (ref 0.1–1.0)
Monocytes Relative: 6.2 % (ref 3.0–12.0)
Neutro Abs: 4.9 10*3/uL (ref 1.4–7.7)
Neutrophils Relative %: 58.6 % (ref 43.0–77.0)
Platelets: 264 10*3/uL (ref 150.0–400.0)
RBC: 4.54 Mil/uL (ref 3.87–5.11)
RDW: 13.4 % (ref 11.5–15.5)
WBC: 8.4 10*3/uL (ref 4.0–10.5)

## 2022-03-01 LAB — HEMOGLOBIN A1C: Hgb A1c MFr Bld: 6 % (ref 4.6–6.5)

## 2022-03-01 LAB — TSH: TSH: 6.78 u[IU]/mL — ABNORMAL HIGH (ref 0.35–5.50)

## 2022-03-01 MED ORDER — LEVOTHYROXINE SODIUM 88 MCG PO TABS: 88.0000 ug | ORAL_TABLET | Freq: Every day | ORAL | 1 refills | Status: DC

## 2022-03-01 MED ORDER — FUROSEMIDE 20 MG PO TABS: 20.0000 mg | ORAL_TABLET | Freq: Every day | ORAL | 3 refills | Status: DC | PRN

## 2022-03-01 NOTE — Progress Notes (Signed)
[name] [name] is a 26 y.o. female here for a follow up of a pre-existing problem.  History of Present Illness:   Chief Complaint  Patient presents with   Cyst    Pt c/o lump left axilla, increasing in size.   Leg Swelling    Pt c/o bilateral swelling in legs x 1 months R > L     HPI   Right axillary mass and b/l nipple pain Patient reports that her prior R axillary mass has grown in size. She is concerned about this. She had imaging of this in Feb and it showed an epidermal inclusion cyst. She is now also having b/l nipple pain and itching. She has family hx of breast cancer and is interested in screening.    Weight gain Since separation from her husband she has had weight gain. She has had reduced exercise and increased eating. She is interested in weight loss medications.  Wt Readings from Last 5 Encounters:  03/01/22 293 lb (132.9 kg)  11/24/21 281 lb 4 oz (127.6 kg)  06/28/21 265 lb 2 oz (120.3 kg)  06/10/21 250 lb (113.4 kg)  06/01/21 259 lb 9.6 oz (117.8 kg)     Leg swelling Has had some increased leg swelling in the past 1 month. Denies chest pain, SOB. She feels that it could be associated with her weight gain. She has had some issues with pitting edema - has a picture of this on her phone.   Hypothyroidism Due for recheck of TSH today. She endorses compliance of levothyroxine 75 mcg daily. She is experiencing weight gain.  Past Medical History:  Diagnosis Date   Anemia    Fibroadenoma of left breast    needs follow up 1/20   Fibroid    Headache    History of chicken pox    Medical history non-contributory    Streptococcus B carrier or suspected carrier    Thyroid disease      Social History   Tobacco Use   Smoking status: Every Day    Types: Cigarettes    Last attempt to quit: 10/25/2010    Years since quitting: 11.3   Smokeless tobacco: Never   Tobacco comments:    Smoking 5 cigarettes a day  Vaping Use   Vaping Use: Never used  Substance Use  Topics   Alcohol use: No    Comment: socially   Drug use: No    Past Surgical History:  Procedure Laterality Date   NO PAST SURGERIES      Family History  Problem Relation Age of Onset   Healthy Mother    Mental retardation Brother    COPD Paternal Grandmother    Bladder Cancer Paternal Grandmother    Diabetes Paternal Great-grandmother     Allergies  Allergen Reactions   Azithromycin     Other reaction(s): Unknown   Vancomycin Itching    Current Medications:   Current Outpatient Medications:    acetaminophen (TYLENOL) 325 MG tablet, Take 650 mg by mouth every 6 (six) hours as needed for mild pain or fever., Disp: , Rfl:    ibuprofen (ADVIL) 200 MG tablet, Take 400 mg by mouth every 6 (six) hours as needed., Disp: , Rfl:    levothyroxine (SYNTHROID) 75 MCG tablet, TAKE 1 TABLET BY MOUTH EVERY DAY, Disp: 90 tablet, Rfl: 1   Review of Systems:   ROS Negative unless otherwise specified per HPI.   Vitals:   Vitals:   03/01/22 1128  BP: 122/80  Pulse: 76  Temp: 98.3 F (36.8 C)  TempSrc: Temporal  Weight: 293 lb (132.9 kg)  Height: '5\' 8"'$  (1.727 m)     Body mass index is 44.55 kg/m.  Physical Exam:   Physical Exam Vitals and nursing note reviewed.  Constitutional:      General: She is not in acute distress.    Appearance: She is well-developed. She is not ill-appearing or toxic-appearing.  Cardiovascular:     Rate and Rhythm: Normal rate and regular rhythm.     Pulses: Normal pulses.     Heart sounds: Normal heart sounds, S1 normal and S2 normal.  Pulmonary:     Effort: Pulmonary effort is normal.     Breath sounds: Normal breath sounds.  Musculoskeletal:     Right lower leg: 1+ Edema present.     Left lower leg: No edema.  Skin:    General: Skin is warm and dry.     Comments: R axillary mass  Neurological:     Mental Status: She is alert.     GCS: GCS eye subscore is 4. GCS verbal subscore is 5. GCS motor subscore is 6.  Psychiatric:         Speech: Speech normal.        Behavior: Behavior normal. Behavior is cooperative.     Assessment and Plan:   Hypothyroidism, unspecified type Due for recheck of her TSH Will adjust her 75 mcg levothyroxine as indicated  Weight gain Update blood work Discussed the need to normalize her TSH first Likely start Wegovy once TSH is normalized Will update A1c and other blood work as well today  Leg swelling No red flags on exam Recommend salt reduction, weight loss, elevation of legs, correction of thyroid Start lasix 20 mg prn Follow-up if new/worsening/lack of improvement  Axillary mass, right; Nipple pain Will order stat imaging to evaluate this No evidence of infection requiring abx tx at this time   Inda Coke, PA-C

## 2022-03-01 NOTE — Patient Instructions (Signed)
It was great to see you!  I'm placing urgent referral to the breast center for imaging of your lump and mammogram.  Update blood work and get your thyroid normalized We will start [facility] once your thyroid is normal  For your leg swelling, start as needed 20 mg lasix Push fluids  Let's follow-up in 3 months, sooner if you have concerns.  If a referral was placed today --> you will be contacted for an appointment. Please note that routine referrals can sometimes take up to 3-4 weeks to process. Please call our office if you haven't heard anything after this time frame.  If blood work, urine studies, or any imaging was ordered today -->  we will release your results to you on your MyChart account (if you have chosen to sign up for this) with further instructions. You may see the results before I do, but when I review them I will send you a message with my report or have my staff call you if things need to be discussed. Please reply to my message with any questions.   Take care,  Inda Coke PA-C

## 2022-03-07 ENCOUNTER — Ambulatory Visit
Admission: RE | Admit: 2022-03-07 | Discharge: 2022-03-07 | Disposition: A | Discharge status: Home / Self Care | Payer: 59 | Source: Ambulatory Visit | Attending: Physician Assistant | Admitting: Physician Assistant

## 2022-03-07 ENCOUNTER — Other Ambulatory Visit: Payer: Self-pay | Admitting: Physician Assistant

## 2022-03-07 DIAGNOSIS — N644 Mastodynia: Secondary | ICD-10-CM

## 2022-03-07 DIAGNOSIS — R2231 Localized swelling, mass and lump, right upper limb: Secondary | ICD-10-CM

## 2022-05-31 ENCOUNTER — Encounter: Payer: Self-pay | Admitting: Physician Assistant

## 2022-05-31 ENCOUNTER — Ambulatory Visit (INDEPENDENT_AMBULATORY_CARE_PROVIDER_SITE_OTHER): Payer: Commercial Managed Care - HMO | Admitting: Family

## 2022-05-31 ENCOUNTER — Encounter: Payer: Self-pay | Admitting: Family

## 2022-05-31 VITALS — BP 113/78 | HR 80 | Temp 98.4°F | Ht 68.0 in | Wt 280.4 lb

## 2022-05-31 DIAGNOSIS — R319 Hematuria, unspecified: Secondary | ICD-10-CM

## 2022-05-31 DIAGNOSIS — R1033 Periumbilical pain: Secondary | ICD-10-CM | POA: Diagnosis not present

## 2022-05-31 LAB — POCT URINALYSIS DIPSTICK
Bilirubin, UA: NEGATIVE
Glucose, UA: NEGATIVE
Ketones, UA: NEGATIVE
Leukocytes, UA: NEGATIVE
Nitrite, UA: NEGATIVE
Protein, UA: POSITIVE — AB
Spec Grav, UA: 1.01 (ref 1.010–1.025)
Urobilinogen, UA: 0.2 E.U./dL
pH, UA: 6 (ref 5.0–8.0)

## 2022-05-31 NOTE — Progress Notes (Signed)
Patient ID: [name] [name], female    DOB: 1996-05-12, 26 y.o.   MRN: [id number]  Chief Complaint  Patient presents with   Hematuria    Pt c/o protein and blood in urine this morning in [place], [place].    Cough    Pt c/o cough, stomach aches, loss of appetite and vomiting for days. Has tried tylenol and ibuprofen.     HPI:      Protein in urine w/blood:  seen in UC today due to dark urine, told to f/u with her PCP - labs done in July were normal for renal fx and glucose. reports her cycle is late by 1 week-10days, uses condoms for [facility], also c/o nausea, vomiting, denies flank or low back pain. Denies any high protein diet or excessive exercise recently.  BV/abd pain: treated a week ago at [facility] for pelvic pain & itching, but no foul odor or discharge. Given Moxafloxacin & Flagyl, took for 2 days but developed dark urine, also reports N,V over last few days. reports last BM about 2 days ago.       Assessment & Plan:  1. Hematuria, unspecified type UA neg for UTI, positive for blood & protein, UPT neg. will send out for microalbumin & culture. Pt concerned if the antibiotics caused the hematuria, but she stopped them about 4-5d ago, should have cleared her system. Advised to continue to drink 2L water daily.  - POCT Urinalysis Dipstick - Microalbumin / creatinine urine ratio - POCT Pregnancy, Urine - Urine Culture  2. Periumbilical abdominal pain UPT neg. pain with mild nausea, possibly related to the 2 antibiotics prescribed by UC, reports mild constipation, advised on increasing water intake and fiber in diet, can try OTC generic Miralax bid for next 5 days or until large, soft stool. Call back if pain is persisting.  - POCT Pregnancy, Urine   Subjective:    Outpatient Medications Prior to Visit  Medication Sig Dispense Refill   acetaminophen (TYLENOL) 325 MG tablet Take 650 mg by mouth every 6 (six) hours as needed for mild pain or fever.     ibuprofen (ADVIL) 200 MG tablet Take 400  mg by mouth every 6 (six) hours as needed.     levothyroxine (SYNTHROID) 88 MCG tablet Take 1 tablet (88 mcg total) by mouth daily. 30 tablet 1   metroNIDAZOLE (FLAGYL) 500 MG tablet SMARTSIG:1 Tablet(s) By Mouth Every 12 Hours (Patient not taking: Reported on 05/31/2022)     moxifloxacin (AVELOX) 400 MG tablet Take 400 mg by mouth daily. (Patient not taking: Reported on 05/31/2022)     furosemide (LASIX) 20 MG tablet Take 1 tablet (20 mg total) by mouth daily as needed for fluid. (Patient not taking: Reported on 05/31/2022) 30 tablet 3   No facility-administered medications prior to visit.   Past Medical History:  Diagnosis Date   Anemia    Fibroadenoma of left breast    needs follow up 1/20   Fibroid    Headache    History of chicken pox    Medical history non-contributory    Streptococcus B carrier or suspected carrier    Thyroid disease    Past Surgical History:  Procedure Laterality Date   NO PAST SURGERIES     Allergies  Allergen Reactions   Azithromycin     Other reaction(s): Unknown   Vancomycin Itching      Objective:    Physical Exam Vitals and nursing note reviewed.  Constitutional:  Appearance: Normal appearance. She is obese.  Cardiovascular:     Rate and Rhythm: Normal rate and regular rhythm.  Pulmonary:     Effort: Pulmonary effort is normal.     Breath sounds: Normal breath sounds.  Musculoskeletal:        General: Normal range of motion.  Skin:    General: Skin is warm and dry.  Neurological:     Mental Status: She is alert.  Psychiatric:        Mood and Affect: Mood normal.        Behavior: Behavior normal.    BP 113/78 (BP Location: Left Arm, Patient Position: Sitting, Cuff Size: Large)   Pulse 80   Temp 98.4 F (36.9 C) (Temporal)   Ht '5\' 8"'$  (1.727 m)   Wt 280 lb 6 oz (127.2 kg)   LMP 04/15/2022 (Exact Date)   SpO2 99%   BMI 42.63 kg/m  Wt Readings from Last 3 Encounters:  05/31/22 280 lb 6 oz (127.2 kg)  03/01/22 293 lb (132.9  kg)  11/24/21 281 lb 4 oz (127.6 kg)       Jeanie Sewer, NP

## 2022-05-31 NOTE — Patient Instructions (Signed)
It was very nice to see you today!   Your pregnancy test and urinalysis are negative except for protein and blood again.  I am sending the urine out for further testing and I will review the results on MyChart in a few days. I would continue to hold the antibiotics you were given. Your nausea & vomiting could be from the antibiotics or from some constipation. Start generic Miralax twice a day for the next 5 days until you have a large soft bowel movement.  Let us know if your symptoms or the bloody urine does not clear up.       PLEASE NOTE:  If you had any lab tests please let us know if you have not heard back within a few days. You may see your results on MyChart before we have a chance to review them but we will give you a call once they are reviewed by [place]. If we ordered any referrals today, please let us know if you have not heard from their office within the next week.

## 2022-06-01 LAB — POCT URINE PREGNANCY: Preg Test, Ur: NEGATIVE

## 2022-06-01 LAB — MICROALBUMIN / CREATININE URINE RATIO
Creatinine,U: 33.6 mg/dL
Microalb Creat Ratio: 108.8 mg/g — ABNORMAL HIGH (ref 0.0–30.0)
Microalb, Ur: 36.5 mg/dL — ABNORMAL HIGH (ref 0.0–1.9)

## 2022-06-02 LAB — URINE CULTURE
MICRO NUMBER:: 14105394
Result:: NO GROWTH
SPECIMEN QUALITY:: ADEQUATE

## 2022-06-07 NOTE — Progress Notes (Signed)
[name] [name] is a 26 y.o. female here for a follow up of a pre-existing problem.  History of Present Illness:   Chief Complaint  Patient presents with   Proteinuria    Pt is here to recheck her urine.   Abdominal Pain    Pt c/o mid upper abd pain x 10 days.Has not taken any medication.    HPI  Abnormal urine Seen in UC on 10/26 due to dark urine, followed up with our office on the same day with Jeanie Sewer NP. At that time she reported that her cycle is late by 1 week-10days, uses condoms for [facility], also c/o nausea, vomiting, denies flank or low back pain. Denies any high protein diet or excessive exercise recently.  She had negative urine pregnancy and urine culture. She was told to push fluids.   Patient's last menstrual period was 06/01/2022 (exact date). Has been off her period since that time. She does not see any more blood in her urine like she did at that time. She does see a lot of bubbles.   BV/abd pain Treated 2 weeks ago at [facility] for pelvic pain & itching, but no foul odor or discharge. Given Moxafloxacin & Flagyl, took for 2 days but developed dark urine, n/v and stopped the medication. She was having periumbilical abdominal pain and is now having mostly epigastric pain and early satiety. She has decreased appetite. Her abdominal pain gets worse throughout the day.  She states that this pain is dull and eating makes it worse She takes two tylenol and motrin daily for daily headaches and has done so since she was around 26 years old Denies any significant alcohol intake   Hypothyroidism She is currently prescribed levothyroxine 88 mcg daily. She takes this as prescribed.  Her last TSH was abnormal and she is overdue for recheck.    Past Medical History:  Diagnosis Date   Anemia    Fibroadenoma of left breast    needs follow up 1/20   Fibroid    Headache    History of chicken pox    Medical history non-contributory    Streptococcus B carrier or suspected  carrier    Thyroid disease      Social History   Tobacco Use   Smoking status: Every Day    Types: Cigarettes    Last attempt to quit: 10/25/2010    Years since quitting: 11.6   Smokeless tobacco: Never   Tobacco comments:    Smoking 5 cigarettes a day  Vaping Use   Vaping Use: Never used  Substance Use Topics   Alcohol use: No    Comment: socially   Drug use: No    Past Surgical History:  Procedure Laterality Date   NO PAST SURGERIES      Family History  Problem Relation Age of Onset   Healthy Mother    Mental retardation Brother    COPD Paternal Grandmother    Bladder Cancer Paternal Grandmother    Diabetes Paternal Great-grandmother     Allergies  Allergen Reactions   Azithromycin     Other reaction(s): Unknown   Vancomycin Itching    Current Medications:   Current Outpatient Medications:    acetaminophen (TYLENOL) 325 MG tablet, Take 650 mg by mouth every 6 (six) hours as needed for mild pain or fever., Disp: , Rfl:    ibuprofen (ADVIL) 200 MG tablet, Take 400 mg by mouth every 6 (six) hours as needed., Disp: , Rfl:  levothyroxine (SYNTHROID) 88 MCG tablet, Take 1 tablet (88 mcg total) by mouth daily., Disp: 30 tablet, Rfl: 1   Review of Systems:   Review of Systems  Constitutional:  Negative for chills, fever, malaise/fatigue and weight loss.  HENT:  Negative for hearing loss, sinus pain and sore throat.   Respiratory:  Negative for cough and hemoptysis.   Cardiovascular:  Negative for chest pain, palpitations, leg swelling and PND.  Gastrointestinal:  Negative for abdominal pain, constipation, diarrhea, heartburn, nausea and vomiting.  Genitourinary:  Negative for dysuria, frequency and urgency.  Musculoskeletal:  Negative for back pain, myalgias and neck pain.  Skin:  Negative for itching and rash.  Neurological:  Negative for dizziness, tingling, seizures and headaches.  Endo/Heme/Allergies:  Negative for polydipsia.  Psychiatric/Behavioral:   Negative for depression. The patient is not nervous/anxious.     Vitals:   Vitals:   06/08/22 0851  BP: 126/80  Pulse: 88  Temp: 98 F (36.7 C)  TempSrc: Temporal  Weight: 278 lb (126.1 kg)  Height: '5\' 8"'$  (1.727 m)     Body mass index is 42.27 kg/m.  Physical Exam:   Physical Exam Vitals and nursing note reviewed.  Constitutional:      General: She is not in acute distress.    Appearance: She is well-developed. She is not ill-appearing or toxic-appearing.  Cardiovascular:     Rate and Rhythm: Normal rate and regular rhythm.     Pulses: Normal pulses.     Heart sounds: Normal heart sounds, S1 normal and S2 normal.  Pulmonary:     Effort: Pulmonary effort is normal.     Breath sounds: Normal breath sounds.  Abdominal:     General: Abdomen is flat. Bowel sounds are normal.     Palpations: Abdomen is soft.     Tenderness: There is no abdominal tenderness. There is no right CVA tenderness, left CVA tenderness, guarding or rebound.  Skin:    General: Skin is warm and dry.  Neurological:     Mental Status: She is alert.     GCS: GCS eye subscore is 4. GCS verbal subscore is 5. GCS motor subscore is 6.  Psychiatric:        Speech: Speech normal.        Behavior: Behavior normal. Behavior is cooperative.    Results for orders placed or performed in visit on 06/08/22  POCT urinalysis dipstick  Result Value Ref Range   Color, UA yellow    Clarity, UA clear    Glucose, UA Negative Negative   Bilirubin, UA negative    Ketones, UA negative    Spec Grav, UA 1.015 1.010 - 1.025   Blood, UA 3+    pH, UA 6.0 5.0 - 8.0   Protein, UA Positive (A) Negative   Urobilinogen, UA 0.2 0.2 or 1.0 E.U./dL   Nitrite, UA negative    Leukocytes, UA Negative Negative   Appearance     Odor      Assessment and Plan:   Abnormal urine finding Her urine continues to show significant blood Recommend that she stop taking NSAIDs at this time, may take Tylenol We will obtain CT abdomen  for further evaluation We will also send off urine for culture and more accurate urinalysis We will refer to urology if indicated  Epigastric abdominal pain Concern for possible gastritis No acute abdomen warranting ER evaluation Given persistent symptoms and ongoing hematuria, will obtain CT scan Recommend treating with over-the-counter Pepcid or Prilosec per  patient choice Continue to avoid NSAIDs We will check blood work today to assess for other causes Follow-up if new or worsening symptoms  Hypothyroidism, unspecified type Update TSH today and adjust levothyroxine 88 mcg accordingly    Inda Coke, PA-C

## 2022-06-07 NOTE — Progress Notes (Signed)
Your urine culture is negative for any infection.  Further testing of your urine does show a significant amount of protein in your urine. This is sometimes seen with diabetes, but your blood sugar was normal a few months ago. I think this may be just a transient finding due to your recent BV infection and taking antibiotics. I'd like to recheck this urine test next week. Please call and schedule a lab only appointment. Any questions, let me know!

## 2022-06-08 ENCOUNTER — Ambulatory Visit (HOSPITAL_BASED_OUTPATIENT_CLINIC_OR_DEPARTMENT_OTHER)
Admission: RE | Admit: 2022-06-08 | Discharge: 2022-06-08 | Disposition: A | Discharge status: Home / Self Care | Payer: Commercial Managed Care - HMO | Source: Ambulatory Visit | Attending: Physician Assistant | Admitting: Physician Assistant

## 2022-06-08 ENCOUNTER — Ambulatory Visit (INDEPENDENT_AMBULATORY_CARE_PROVIDER_SITE_OTHER): Payer: Commercial Managed Care - HMO | Admitting: Physician Assistant

## 2022-06-08 ENCOUNTER — Encounter: Payer: Self-pay | Admitting: Physician Assistant

## 2022-06-08 VITALS — BP 126/80 | HR 88 | Temp 98.0°F | Ht 68.0 in | Wt 278.0 lb

## 2022-06-08 DIAGNOSIS — E039 Hypothyroidism, unspecified: Secondary | ICD-10-CM

## 2022-06-08 DIAGNOSIS — R1013 Epigastric pain: Secondary | ICD-10-CM

## 2022-06-08 DIAGNOSIS — R8290 Unspecified abnormal findings in urine: Secondary | ICD-10-CM

## 2022-06-08 LAB — URINALYSIS, ROUTINE W REFLEX MICROSCOPIC
Bilirubin Urine: NEGATIVE
Ketones, ur: NEGATIVE
Leukocytes,Ua: NEGATIVE
Nitrite: NEGATIVE
Specific Gravity, Urine: 1.02 (ref 1.000–1.030)
Total Protein, Urine: >=300 — AB
Urine Glucose: NEGATIVE
Urobilinogen, UA: 0.2 (ref 0.0–1.0)
pH: 6.5 (ref 5.0–8.0)

## 2022-06-08 LAB — POCT URINALYSIS DIPSTICK
Bilirubin, UA: NEGATIVE
Glucose, UA: NEGATIVE
Ketones, UA: NEGATIVE
Leukocytes, UA: NEGATIVE
Nitrite, UA: NEGATIVE
Protein, UA: POSITIVE — AB
Spec Grav, UA: 1.015 (ref 1.010–1.025)
Urobilinogen, UA: 0.2 E.U./dL
pH, UA: 6 (ref 5.0–8.0)

## 2022-06-08 LAB — COMPREHENSIVE METABOLIC PANEL
ALT: 62 U/L — ABNORMAL HIGH (ref 0–35)
AST: 36 U/L (ref 0–37)
Albumin: 4.2 g/dL (ref 3.5–5.2)
Alkaline Phosphatase: 55 U/L (ref 39–117)
BUN: 15 mg/dL (ref 6–23)
CO2: 25 mEq/L (ref 19–32)
Calcium: 9.9 mg/dL (ref 8.4–10.5)
Chloride: 103 mEq/L (ref 96–112)
Creatinine, Ser: 0.98 mg/dL (ref 0.40–1.20)
GFR: 78.41 mL/min (ref >60.00)
Glucose, Bld: 75 mg/dL (ref 70–99)
Potassium: 4.3 mEq/L (ref 3.5–5.1)
Sodium: 138 mEq/L (ref 135–145)
Total Bilirubin: 0.4 mg/dL (ref 0.2–1.2)
Total Protein: 7.6 g/dL (ref 6.0–8.3)

## 2022-06-08 LAB — CBC WITH DIFFERENTIAL/PLATELET
Basophils Absolute: 0.1 10*3/uL (ref 0.0–0.1)
Basophils Relative: 1 % (ref 0.0–3.0)
Eosinophils Absolute: 0.2 10*3/uL (ref 0.0–0.7)
Eosinophils Relative: 2.5 % (ref 0.0–5.0)
HCT: 40.3 % (ref 36.0–46.0)
Hemoglobin: 13.4 g/dL (ref 12.0–15.0)
Lymphocytes Relative: 28.3 % (ref 12.0–46.0)
Lymphs Abs: 2.3 10*3/uL (ref 0.7–4.0)
MCHC: 33.2 g/dL (ref 30.0–36.0)
MCV: 88.6 fl (ref 78.0–100.0)
Monocytes Absolute: 0.5 10*3/uL (ref 0.1–1.0)
Monocytes Relative: 6.4 % (ref 3.0–12.0)
Neutro Abs: 5.1 10*3/uL (ref 1.4–7.7)
Neutrophils Relative %: 61.8 % (ref 43.0–77.0)
Platelets: 401 10*3/uL — ABNORMAL HIGH (ref 150.0–400.0)
RBC: 4.55 Mil/uL (ref 3.87–5.11)
RDW: 13.1 % (ref 11.5–15.5)
WBC: 8.2 10*3/uL (ref 4.0–10.5)

## 2022-06-08 LAB — LIPASE: Lipase: 54 U/L (ref 11.0–59.0)

## 2022-06-08 LAB — TSH: TSH: 9.59 u[IU]/mL — ABNORMAL HIGH (ref 0.35–5.50)

## 2022-06-08 LAB — POCT URINE PREGNANCY: Preg Test, Ur: NEGATIVE

## 2022-06-08 LAB — H. PYLORI ANTIBODY, IGG: H Pylori IgG: NEGATIVE

## 2022-06-08 MED ORDER — IOHEXOL 300 MG/ML  SOLN
100.0000 mL | Freq: Once | INTRAMUSCULAR | Status: AC | PRN
  Administered 2022-06-08: 100 mL via INTRAVENOUS

## 2022-06-08 NOTE — Patient Instructions (Addendum)
It was great to see you!  Start daily over the counter pepcid or prilosec (generic is fine) daily x 2 weeks Avoid all ibuprofen/motrin; tylenol is ok  We will update blood, urine and obtain CT scan of your abdomen today  When feeling better, let's plan to follow-up on regular headaches so we can find better way to manage these  Take care,  Inda Coke PA-C

## 2022-06-09 LAB — URINE CULTURE
MICRO NUMBER:: 14141662
Result:: NO GROWTH
SPECIMEN QUALITY:: ADEQUATE

## 2022-06-11 ENCOUNTER — Other Ambulatory Visit: Payer: Self-pay | Admitting: Physician Assistant

## 2022-06-11 DIAGNOSIS — R319 Hematuria, unspecified: Secondary | ICD-10-CM

## 2022-06-11 DIAGNOSIS — R748 Abnormal levels of other serum enzymes: Secondary | ICD-10-CM

## 2022-06-12 ENCOUNTER — Other Ambulatory Visit: Payer: Self-pay | Admitting: Physician Assistant

## 2022-06-12 MED ORDER — METRONIDAZOLE 0.75 % VA GEL: 1.0000 | Freq: Every day | VAGINAL | 0 refills | Status: AC

## 2022-09-20 LAB — LAB REPORT - SCANNED
A1c: 5.7
Creatinine, POC: 105 mg/dL
EGFR: 102
Microalbumin, Urine: 229.1

## 2022-09-28 ENCOUNTER — Other Ambulatory Visit (HOSPITAL_COMMUNITY): Payer: Self-pay | Admitting: Nephrology

## 2022-09-28 DIAGNOSIS — R319 Hematuria, unspecified: Secondary | ICD-10-CM

## 2022-10-08 ENCOUNTER — Other Ambulatory Visit (HOSPITAL_COMMUNITY): Payer: Self-pay | Admitting: Physician Assistant

## 2022-10-08 DIAGNOSIS — Z01818 Encounter for other preprocedural examination: Secondary | ICD-10-CM

## 2022-10-09 ENCOUNTER — Other Ambulatory Visit (HOSPITAL_COMMUNITY): Payer: Self-pay | Admitting: Physician Assistant

## 2022-10-09 NOTE — Progress Notes (Incomplete)
Chief Complaint: Patient was seen in consultation today for hematuria  Referring Physician(s): Upton,Elizabeth  Supervising Physician: Sandi Mariscal  Patient Status: [facility] - Out-pt  History of Present Illness: [name] [name] is a 27 y.o. female with a past medical history significant for anemia and hypothyroidism who presents today for a random renal biopsy. [name] [name] presented to her PCP in June 2023 due to lower extremity swelling and dark colored urine which started 1 week after a viral illness. UA was notable for hematuria and proteinuria. She was referred to urology and underwent CT abd/pelvis 06/08/22 which was unremarkable. She was referred to nephrology for further evaluation and lab work up again noted hematuria and proteinuria with normal creatinine. She has been referred to IR for a random renal biopsy for further evaluation.  Past Medical History:  Diagnosis Date   Anemia    Fibroadenoma of left breast    needs follow up 1/20   Fibroid    Headache    History of chicken pox    Medical history non-contributory    Streptococcus B carrier or suspected carrier    Thyroid disease     Past Surgical History:  Procedure Laterality Date   NO PAST SURGERIES      Allergies: Azithromycin and Vancomycin  Medications: Prior to Admission medications   Medication Sig Start Date End Date Taking? Authorizing Provider  acetaminophen (TYLENOL) 325 MG tablet Take 650 mg by mouth every 6 (six) hours as needed for mild pain or fever.    [provider]  ibuprofen (ADVIL) 200 MG tablet Take 400 mg by mouth every 6 (six) hours as needed.    [provider]  levothyroxine (SYNTHROID) 88 MCG tablet Take 1 tablet (88 mcg total) by mouth daily. 03/01/22   Inda Coke, PA     Family History  Problem Relation Age of Onset   Healthy Mother    Mental retardation Brother    COPD Paternal Grandmother    Bladder Cancer Paternal Grandmother    Diabetes Paternal  Great-grandmother     Social History   Socioeconomic History   Marital status: Significant Other    Spouse name: Not on file   Number of children: 1   Years of education: 2 yrs coll   Highest education level: Not on file  Occupational History   Occupation: Secondary school teacher  Tobacco Use   Smoking status: Every Day    Types: Cigarettes    Last attempt to quit: 10/25/2010    Years since quitting: 11.9   Smokeless tobacco: Never   Tobacco comments:    Smoking 5 cigarettes a day  Vaping Use   Vaping Use: Never used  Substance and Sexual Activity   Alcohol use: No    Comment: socially   Drug use: No   Sexual activity: Yes    Birth control/protection: None  Other Topics Concern   Not on file  Social History Narrative   Married with a son and daughter   Property management   2-4 cups caffeine daily (unsweet tea).   Social Determinants of Health   Financial Resource Strain: Low Risk  (04/23/2018)   Overall Financial Resource Strain (CARDIA)    Difficulty of Paying Living Expenses: Not hard at all  Food Insecurity: No Food Insecurity (04/23/2018)   Hunger Vital Sign    Worried About Running Out of Food in the Last Year: Never true    Ran Out of Food in the Last Year: Never true  Transportation  Needs: No Transportation Needs (04/23/2018)   PRAPARE - Hydrologist (Medical): No    Lack of Transportation (Non-Medical): No  Physical Activity: Inactive (04/23/2018)   Exercise Vital Sign    Days of Exercise per Week: 0 days    Minutes of Exercise per Session: 0 min  Stress: No Stress Concern Present (04/23/2018)   [place]    Feeling of Stress : Not at all  Social Connections: Moderately Isolated (04/23/2018)   Social Connection and Isolation Panel [NHANES]    Frequency of Communication with Friends and Family: Once a week    Frequency of Social Gatherings with Friends and Family: Once a  week    Attends Religious Services: Never    Marine scientist or Organizations: No    Attends Archivist Meetings: Never    Marital Status: Living with partner     Review of Systems: A 12 point ROS discussed and pertinent positives are indicated in the HPI above.  All other systems are negative.  Review of Systems  Constitutional:  Negative for chills and fever.  Respiratory:  Negative for cough and shortness of breath.   Cardiovascular:  Negative for chest pain.  Gastrointestinal:  Negative for abdominal pain, diarrhea, nausea and vomiting.  Genitourinary:  Negative for flank pain.  Musculoskeletal:  Negative for back pain.  Neurological:  Negative for dizziness and headaches.   Vital Signs: There were no vitals taken for this visit.  Physical Exam Vitals reviewed.  Constitutional:      General: She is not in acute distress. HENT:     Head: Normocephalic.     Mouth/Throat:     Mouth: Mucous membranes are moist.     Pharynx: Oropharynx is clear. No oropharyngeal exudate or posterior oropharyngeal erythema.  Cardiovascular:     Rate and Rhythm: Normal rate and regular rhythm.  Pulmonary:     Effort: Pulmonary effort is normal.     Breath sounds: Normal breath sounds.  Abdominal:     General: There is no distension.     Palpations: Abdomen is soft.     Tenderness: There is no abdominal tenderness.  Skin:    General: Skin is warm and dry.  Neurological:     Mental Status: She is alert and oriented to person, place, and time.  Psychiatric:        Mood and Affect: Mood normal.        Behavior: Behavior normal.        Thought Content: Thought content normal.        Judgment: Judgment normal.        Imaging: No results found.  Labs:  CBC: Recent Labs    03/01/22 1157 06/08/22 0926  WBC 8.4 8.2  HGB 13.6 13.4  HCT 40.8 40.3  PLT 264.0 401.0*    COAGS: No results for input(s): "INR", "APTT" in the last 8760 hours.  BMP: Recent Labs     03/01/22 1157 06/08/22 0926  NA 136 138  K 3.9 4.3  CL 104 103  CO2 22 25  GLUCOSE 76 75  BUN 14 15  CALCIUM 9.5 9.9  CREATININE 0.69 0.98    LIVER FUNCTION TESTS: Recent Labs    03/01/22 1157 06/08/22 0926  BILITOT 0.4 0.4  AST 23 36  ALT 35 62*  ALKPHOS 54 55  PROT 7.3 7.6  ALBUMIN 4.4 4.2    TUMOR MARKERS: No results  for input(s): "AFPTM", "CEA", "CA199", "CHROMGRNA" in the last 8760 hours.  Assessment and Plan:  27 y/o F followed by nephrology for hematuria and proteinuria of unknown etiology, laboratory and imaging work up have been negative thus far and IR has been consulted for a random renal biopsy to further direct care.  Risks and benefits of random renal biopsy was discussed with the patient and/or patient's family including, but not limited to bleeding, infection, damage to adjacent structures or low yield requiring additional tests.  All of the questions were answered and there is agreement to proceed.  Consent signed and in chart.  Thank you for this interesting consult.  I greatly enjoyed meeting LIANNA BURCHETTE and look forward to participating in their care.  A copy of this report was sent to the requesting provider on this date.  Electronically Signed: Joaquim Nam, PA-C 10/09/2022, 9:35 AM   I spent a total of 30 Minutes in face to face in clinical consultation, greater than 50% of which was counseling/coordinating care for hematuria.

## 2022-10-10 ENCOUNTER — Ambulatory Visit (HOSPITAL_COMMUNITY)
Admission: RE | Admit: 2022-10-10 | Discharge: 2022-10-10 | Disposition: A | Discharge status: Home / Self Care | Payer: Commercial Managed Care - HMO | Source: Ambulatory Visit | Attending: Nephrology | Admitting: Nephrology

## 2022-10-10 DIAGNOSIS — F419 Anxiety disorder, unspecified: Secondary | ICD-10-CM | POA: Insufficient documentation

## 2022-10-10 DIAGNOSIS — R809 Proteinuria, unspecified: Secondary | ICD-10-CM | POA: Insufficient documentation

## 2022-10-10 DIAGNOSIS — E039 Hypothyroidism, unspecified: Secondary | ICD-10-CM | POA: Insufficient documentation

## 2022-10-10 DIAGNOSIS — Z87891 Personal history of nicotine dependence: Secondary | ICD-10-CM | POA: Diagnosis not present

## 2022-10-10 DIAGNOSIS — Z79899 Other long term (current) drug therapy: Secondary | ICD-10-CM | POA: Insufficient documentation

## 2022-10-10 DIAGNOSIS — Z01818 Encounter for other preprocedural examination: Secondary | ICD-10-CM

## 2022-10-10 DIAGNOSIS — Z01812 Encounter for preprocedural laboratory examination: Secondary | ICD-10-CM | POA: Insufficient documentation

## 2022-10-10 DIAGNOSIS — R319 Hematuria, unspecified: Secondary | ICD-10-CM | POA: Insufficient documentation

## 2022-10-10 LAB — CBC
HCT: 43.6 % (ref 36.0–46.0)
Hemoglobin: 14.5 g/dL (ref 12.0–15.0)
MCH: 30 pg (ref 26.0–34.0)
MCHC: 33.3 g/dL (ref 30.0–36.0)
MCV: 90.3 fL (ref 80.0–100.0)
Platelets: 314 10*3/uL (ref 150–400)
RBC: 4.83 MIL/uL (ref 3.87–5.11)
RDW: 12.7 % (ref 11.5–15.5)
WBC: 10.5 10*3/uL (ref 4.0–10.5)
nRBC: 0 % (ref 0.0–0.2)

## 2022-10-10 LAB — PROTIME-INR
INR: 1 (ref 0.8–1.2)
Prothrombin Time: 12.7 seconds (ref 11.4–15.2)

## 2022-10-10 LAB — PREGNANCY, URINE: Preg Test, Ur: NEGATIVE

## 2022-10-10 MED ORDER — MIDAZOLAM HCL 2 MG/2ML IJ SOLN
INTRAMUSCULAR | Status: AC | PRN
  Administered 2022-10-10: 1 mg via INTRAVENOUS

## 2022-10-10 MED ORDER — LIDOCAINE HCL (PF) 1 % IJ SOLN: 10.0000 mL | Freq: Once | INTRAMUSCULAR | Status: DC

## 2022-10-10 MED ORDER — FENTANYL CITRATE (PF) 100 MCG/2ML IJ SOLN
INTRAMUSCULAR | Status: AC
  Filled 2022-10-10: qty 2

## 2022-10-10 MED ORDER — FENTANYL CITRATE (PF) 100 MCG/2ML IJ SOLN
INTRAMUSCULAR | Status: AC | PRN
  Administered 2022-10-10: 50 ug via INTRAVENOUS

## 2022-10-10 MED ORDER — MIDAZOLAM HCL 2 MG/2ML IJ SOLN
INTRAMUSCULAR | Status: AC
  Filled 2022-10-10: qty 2

## 2022-10-10 MED ORDER — SODIUM CHLORIDE 0.9 % IV SOLN: INTRAVENOUS | Status: DC

## 2022-10-10 MED ORDER — GELATIN ABSORBABLE 12-7 MM EX MISC: 1.0000 | Freq: Once | CUTANEOUS | Status: DC

## 2022-10-10 NOTE — Procedures (Signed)
Interventional Radiology Procedure Note  Procedure: Ultrasound guided left renal biopsy  Findings: Please refer to procedural dictation for full description. 16 ga core x2 from inferior pole.  Gelfoam slurry needle track embolization.  Complications: None immediate  Estimated Blood Loss: < 5 mL  Recommendations: Strict 3 hour bedrest. Follow up Pathology results.   Ruthann Cancer, MD

## 2022-10-12 ENCOUNTER — Ambulatory Visit: Payer: Commercial Managed Care - HMO | Admitting: Physician Assistant

## 2022-10-15 ENCOUNTER — Encounter (HOSPITAL_COMMUNITY): Payer: Self-pay

## 2022-10-15 LAB — SURGICAL PATHOLOGY

## 2022-11-10 ENCOUNTER — Encounter: Payer: Self-pay | Admitting: Physician Assistant

## 2022-11-12 LAB — BASIC METABOLIC PANEL
BUN: 10 (ref 4–21)
CO2: 26 — AB (ref 13–22)
Chloride: 105 (ref 99–108)
Creatinine: 0.8 (ref 0.5–1.1)
Glucose: 87
Potassium: 4.4 mEq/L (ref 3.5–5.1)
Sodium: 140 (ref 137–147)

## 2022-11-12 LAB — COMPREHENSIVE METABOLIC PANEL
Albumin: 4.5 (ref 3.5–5.0)
Calcium: 9.5 (ref 8.7–10.7)
eGFR: 107

## 2022-11-14 LAB — LAB REPORT - SCANNED
Albumin, Urine POC: 72.3
Creatinine, POC: 44.1 mg/dL
EGFR: 107
Protein/Creatinine Ratio: 164
Protein/Creatinine Ratio: 327

## 2022-11-14 LAB — HM HIV SCREENING LAB: HM HIV Screening: NEGATIVE

## 2023-01-01 ENCOUNTER — Encounter: Payer: Self-pay | Admitting: Physician Assistant

## 2023-06-28 LAB — LAB REPORT - SCANNED
Albumin, Urine POC: 316.1
Chlamydia, Swab/Urine, PCR: NEGATIVE
Creatinine, POC: 109.3 mg/dL
EGFR: 91
Microalb Creat Ratio: 289

## 2023-07-23 ENCOUNTER — Ambulatory Visit (INDEPENDENT_AMBULATORY_CARE_PROVIDER_SITE_OTHER): Payer: Self-pay | Admitting: Physician Assistant

## 2023-07-23 ENCOUNTER — Encounter: Payer: Self-pay | Admitting: Physician Assistant

## 2023-07-23 ENCOUNTER — Other Ambulatory Visit (HOSPITAL_COMMUNITY)
Admission: RE | Admit: 2023-07-23 | Discharge: 2023-07-23 | Disposition: A | Discharge status: Home / Self Care | Payer: BLUE CROSS/BLUE SHIELD | Source: Ambulatory Visit | Attending: Physician Assistant | Admitting: Physician Assistant

## 2023-07-23 VITALS — BP 134/84 | HR 103 | Temp 97.7°F | Ht 68.0 in | Wt 281.8 lb

## 2023-07-23 DIAGNOSIS — Z113 Encounter for screening for infections with a predominantly sexual mode of transmission: Secondary | ICD-10-CM

## 2023-07-23 DIAGNOSIS — E669 Obesity, unspecified: Secondary | ICD-10-CM

## 2023-07-23 DIAGNOSIS — R933 Abnormal findings on diagnostic imaging of other parts of digestive tract: Secondary | ICD-10-CM | POA: Insufficient documentation

## 2023-07-23 DIAGNOSIS — N029 Recurrent and persistent hematuria with unspecified morphologic changes: Secondary | ICD-10-CM | POA: Diagnosis not present

## 2023-07-23 DIAGNOSIS — M940 Chondrocostal junction syndrome [Tietze]: Secondary | ICD-10-CM | POA: Insufficient documentation

## 2023-07-23 DIAGNOSIS — Z6841 Body Mass Index (BMI) 40.0 and over, adult: Secondary | ICD-10-CM

## 2023-07-23 DIAGNOSIS — R350 Frequency of micturition: Secondary | ICD-10-CM

## 2023-07-23 DIAGNOSIS — K219 Gastro-esophageal reflux disease without esophagitis: Secondary | ICD-10-CM | POA: Insufficient documentation

## 2023-07-23 LAB — POCT URINALYSIS DIPSTICK
Bilirubin, UA: NEGATIVE
Blood, UA: POSITIVE
Glucose, UA: NEGATIVE
Ketones, UA: NEGATIVE
Nitrite, UA: NEGATIVE
Protein, UA: POSITIVE — AB
Spec Grav, UA: 1.01 (ref 1.010–1.025)
Urobilinogen, UA: 1 U/dL
pH, UA: 7 (ref 5.0–8.0)

## 2023-07-23 MED ORDER — CEPHALEXIN 500 MG PO CAPS: 500.0000 mg | ORAL_CAPSULE | Freq: Two times a day (BID) | ORAL | 0 refills | Status: DC

## 2023-07-23 NOTE — Patient Instructions (Addendum)
It was great to see you!  Start keflex for your urinary tract infection (UTI) symptom(s)  Trial bactroban on your boil  We will send in Wegovy injections for weight loss for you to try  If you do not get this covered, consider [facility] in [place] -- I believe that they have this  Take care,  Jarold Motto PA-C

## 2023-07-23 NOTE — Progress Notes (Signed)
[name] [name] is a 27 y.o. female here for a follow up of a pre-existing problem.  History of Present Illness:   Chief Complaint  Patient presents with   Urinary Frequency    Vaginal swelling  Requesting STI check     HPI  Urinary frequency Patient reports urinary frequency x a few days Denies burning, stinging, lower abdominal pain Does also notice that near the entrance of her vagina is some swelling  She also recently found out her sexual partner has not been faithful She is requesting full sexual transmitted infection check Denies fevers, chills, significant back pain   Thin basement membrane disease Currently UpToDate with nephrology Denies any new/worsening symptom(s)   Obesity Interested in losing weight She is exercising regularly, counting calories and following a low carbohydrate diet without any significant success with weight loss She is interested in a glp-1    Past Medical History:  Diagnosis Date   Anemia    Fibroadenoma of left breast    needs follow up 1/20   Fibroid    Headache    History of chicken pox    Medical history non-contributory    Streptococcus B carrier or suspected carrier    Thyroid disease      Social History   Tobacco Use   Smoking status: Every Day    Current packs/day: 0.00    Types: Cigarettes    Last attempt to quit: 10/25/2010    Years since quitting: 12.7   Smokeless tobacco: Never   Tobacco comments:    Smoking 5 cigarettes a day  Vaping Use   Vaping status: Never Used  Substance Use Topics   Alcohol use: No    Comment: socially   Drug use: No    Past Surgical History:  Procedure Laterality Date   NO PAST SURGERIES      Family History  Problem Relation Age of Onset   Healthy Mother    Mental retardation Brother    COPD Paternal Grandmother    Bladder Cancer Paternal Grandmother    Diabetes Paternal Great-grandmother     Allergies  Allergen Reactions   Azithromycin     Other reaction(s):  Unknown   Vancomycin Itching    Current Medications:   Current Outpatient Medications:    acetaminophen (TYLENOL) 325 MG tablet, Take 650 mg by mouth every 6 (six) hours as needed for mild pain or fever., Disp: , Rfl:    cephALEXin (KEFLEX) 500 MG capsule, Take 1 capsule (500 mg total) by mouth 2 (two) times daily., Disp: 14 capsule, Rfl: 0   ibuprofen (ADVIL) 200 MG tablet, Take 400 mg by mouth every 6 (six) hours as needed., Disp: , Rfl:    levothyroxine (SYNTHROID) 88 MCG tablet, Take 1 tablet (88 mcg total) by mouth daily., Disp: 30 tablet, Rfl: 1   Review of Systems:   ROS Negative unless otherwise specified per HPI.  Vitals:   Vitals:   07/23/23 1129  BP: 134/84  Pulse: (!) 103  Temp: 97.7 F (36.5 C)  TempSrc: Temporal  SpO2: 100%  Weight: 281 lb 12.8 oz (127.8 kg)  Height: 5\' 8"  (1.727 m)     Body mass index is 42.85 kg/m.  Physical Exam:   Physical Exam Vitals and nursing note reviewed. Exam conducted with a chaperone present.  Constitutional:      General: She is not in acute distress.    Appearance: She is well-developed. She is not ill-appearing or toxic-appearing.  Cardiovascular:  Rate and Rhythm: Normal rate and regular rhythm.     Pulses: Normal pulses.     Heart sounds: Normal heart sounds, S1 normal and S2 normal.  Pulmonary:     Effort: Pulmonary effort is normal.     Breath sounds: Normal breath sounds.  Genitourinary:    Labia:        Right: No rash or tenderness.        Left: No rash or tenderness.      Vagina: Normal.     Comments: No obvious swelling found Skin:    General: Skin is warm and dry.  Neurological:     Mental Status: She is alert.     GCS: GCS eye subscore is 4. GCS verbal subscore is 5. GCS motor subscore is 6.  Psychiatric:        Speech: Speech normal.        Behavior: Behavior normal. Behavior is cooperative.    Results for orders placed or performed in visit on 07/23/23  POCT urinalysis dipstick  Result Value  Ref Range   Color, UA yellow    Clarity, UA clear    Glucose, UA Negative Negative   Bilirubin, UA negative    Ketones, UA negative    Spec Grav, UA 1.010 1.010 - 1.025   Blood, UA positive    pH, UA 7.0 5.0 - 8.0   Protein, UA Positive (A) Negative   Urobilinogen, UA 1.0 0.2 or 1.0 E.U./dL   Nitrite, UA negative    Leukocytes, UA 4+ (A) Negative   Appearance     Odor       Assessment and Plan:   Frequency of urination UA with leukocytes -- will start empiric treatment(s) for acute cystitis with keflex per orders Culture pending Follow-up based on results No red flags  Screening examination for STD (sexually transmitted disease) Update blood work and vaginal swab today  Thin basement membrane disease Continue to monitor per nephro  Obesity, unspecified class, unspecified obesity type, unspecified whether serious comorbidity present Reviewed risks/benefits of medication Will start Wegovy 0.25 mg weekly Follow-up if starting after 1-3 months   I, Isabelle Course, acting as a Neurosurgeon for [employer], [place]., have documented all relevant documentation on the behalf of Jarold Motto, [place], as directed by  Jarold Motto, PA while in the presence of Jarold Motto, [place].  I, Jarold Motto, [place], have reviewed all documentation for this visit. The documentation on 07/23/23 for the exam, diagnosis, procedures, and orders are all accurate and complete.   Jarold Motto, PA-C

## 2023-07-24 LAB — CERVICOVAGINAL ANCILLARY ONLY
Bacterial Vaginitis (gardnerella): NEGATIVE
Candida Glabrata: NEGATIVE
Candida Vaginitis: NEGATIVE
Chlamydia: NEGATIVE
Comment: NEGATIVE
Comment: NEGATIVE
Comment: NEGATIVE
Comment: NEGATIVE
Comment: NEGATIVE
Comment: NORMAL
Neisseria Gonorrhea: NEGATIVE
Trichomonas: NEGATIVE

## 2023-07-25 ENCOUNTER — Encounter: Payer: Self-pay | Admitting: Physician Assistant

## 2023-07-25 LAB — URINE CULTURE
MICRO NUMBER:: 15860700
Result:: NO GROWTH
SPECIMEN QUALITY:: ADEQUATE

## 2023-07-25 LAB — HIV ANTIBODY (ROUTINE TESTING W REFLEX): HIV 1&2 Ab, 4th Generation: NONREACTIVE

## 2023-07-25 LAB — RPR: RPR Ser Ql: NONREACTIVE

## 2023-07-26 ENCOUNTER — Other Ambulatory Visit: Payer: Self-pay | Admitting: Physician Assistant

## 2023-07-26 ENCOUNTER — Encounter: Payer: Self-pay | Admitting: Physician Assistant

## 2023-07-26 MED ORDER — FLUCONAZOLE 150 MG PO TABS: ORAL_TABLET | ORAL | 0 refills | Status: DC

## 2023-07-26 NOTE — Telephone Encounter (Signed)
Okay to order Diflucan?

## 2023-09-04 ENCOUNTER — Encounter (INDEPENDENT_AMBULATORY_CARE_PROVIDER_SITE_OTHER): Payer: Self-pay

## 2023-10-08 ENCOUNTER — Encounter (INDEPENDENT_AMBULATORY_CARE_PROVIDER_SITE_OTHER): Payer: Self-pay

## 2023-11-28 ENCOUNTER — Ambulatory Visit (HOSPITAL_BASED_OUTPATIENT_CLINIC_OR_DEPARTMENT_OTHER)
Admission: RE | Admit: 2023-11-28 | Discharge: 2023-11-28 | Disposition: A | Discharge status: Home / Self Care | Source: Ambulatory Visit | Attending: Family Medicine | Admitting: Family Medicine

## 2023-11-28 ENCOUNTER — Encounter (HOSPITAL_BASED_OUTPATIENT_CLINIC_OR_DEPARTMENT_OTHER): Payer: Self-pay

## 2023-11-28 ENCOUNTER — Ambulatory Visit (HOSPITAL_BASED_OUTPATIENT_CLINIC_OR_DEPARTMENT_OTHER)
Admit: 2023-11-28 | Discharge: 2023-11-28 | Disposition: A | Discharge status: Home / Self Care | Attending: Family Medicine | Admitting: Radiology

## 2023-11-28 VITALS — BP 145/95 | HR 100 | Temp 98.3°F | Resp 20

## 2023-11-28 DIAGNOSIS — N898 Other specified noninflammatory disorders of vagina: Secondary | ICD-10-CM | POA: Diagnosis present

## 2023-11-28 DIAGNOSIS — R350 Frequency of micturition: Secondary | ICD-10-CM | POA: Insufficient documentation

## 2023-11-28 DIAGNOSIS — R059 Cough, unspecified: Secondary | ICD-10-CM | POA: Diagnosis not present

## 2023-11-28 DIAGNOSIS — J208 Acute bronchitis due to other specified organisms: Secondary | ICD-10-CM | POA: Diagnosis not present

## 2023-11-28 DIAGNOSIS — R051 Acute cough: Secondary | ICD-10-CM | POA: Diagnosis not present

## 2023-11-28 LAB — POCT URINALYSIS DIP (MANUAL ENTRY)
Bilirubin, UA: NEGATIVE
Glucose, UA: NEGATIVE mg/dL
Ketones, POC UA: NEGATIVE mg/dL
Leukocytes, UA: NEGATIVE
Nitrite, UA: NEGATIVE
Protein Ur, POC: 100 mg/dL — AB
Spec Grav, UA: 1.015
Urobilinogen, UA: 1 U/dL
pH, UA: 6

## 2023-11-28 MED ORDER — ALBUTEROL SULFATE HFA 108 (90 BASE) MCG/ACT IN AERS: 2.0000 | INHALATION_SPRAY | RESPIRATORY_TRACT | 0 refills | Status: DC | PRN

## 2023-11-28 MED ORDER — COMPACT SPACE CHAMBER DEVI: 0 refills | Status: DC

## 2023-11-28 MED ORDER — PROMETHAZINE-DM 6.25-15 MG/5ML PO SYRP
5.0000 mL | ORAL_SOLUTION | Freq: Four times a day (QID) | ORAL | 0 refills | Status: DC | PRN
Start: 2023-11-28 — End: 2024-04-29

## 2023-11-28 NOTE — ED Triage Notes (Addendum)
 Pt reports last Saturday she noticed a increase in vaginal discharge, milky in color. Pt was with a different partner the Friday before they did use a condom. Pt also reports on Tuesday she started having upper respiratory symptoms.

## 2023-11-28 NOTE — ED Provider Notes (Signed)
 [name] [name] [facility]    CSN: [id number] Arrival date & time: 11/28/23  1839      History   Chief Complaint Chief Complaint  Patient presents with   Exposure to STD    I also have an upper respiratory infection of some sort. - Entered by patient    HPI [name] [name] is a 28 y.o. female.   Patient reports that Saturday, 11/23/2023, she noticed an increase in vaginal discharge with a slight milky color.  It did not really have an odor but she is noticing more vaginal discharge.  She has a new sexual partner and their relationship started on Friday, 11/22/2023.  She reports that they did use a condom but she is concerned she may have an STI and would like testing.  She is also reporting some increase in urinary frequency but no burning and no vaginal itching.  On Tuesday, 11/26/2023, she started with cough, head congestion, chest congestion and some wheezing.  She denies fever, nausea, vomiting, diarrhea, constipation.   Exposure to STD Pertinent negatives include no chest pain, no abdominal pain and no shortness of breath.    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 Authorizing Provider  albuterol  (VENTOLIN  HFA) 108 (90 Base) MCG/ACT inhaler Inhale 2 puffs into the lungs every 4 (four) hours as needed for wheezing or shortness of breath. 11/28/23  Yes Guss Legacy, FNP  promethazine -dextromethorphan (PROMETHAZINE -DM) 6.25-15 MG/5ML syrup Take 5 mLs by mouth 4 (four) times daily as needed for cough. Do not use and drive - May make drowsy. 11/28/23  Yes Guss Legacy, FNP  Spacer/Aero-Holding Chambers (COMPACT SPACE CHAMBER) DEVI Use with the albuterol  inhaler 11/28/23  Yes Guss Legacy, FNP  ibuprofen  (ADVIL ) 200 MG tablet Take 400 mg by mouth every 6 (six) hours as needed.    [provider]  levothyroxine  (SYNTHROID ) 88 MCG tablet Take 1 tablet (88 mcg total) by mouth daily. 03/01/22   Alexander Iba, PA    Family History Family History  Problem Relation Age of Onset  Healthy Mother    Mental retardation Brother    COPD Paternal Grandmother    Bladder Cancer Paternal Grandmother    Diabetes Paternal Great-grandmother     Social History Social History   Tobacco Use   Smoking status: Every Day    Current packs/day: 0.00    Types: Cigarettes    Last attempt to quit: 10/25/2010    Years since quitting: 13.1   Smokeless tobacco: Never   Tobacco comments:    Smoking 5 cigarettes a day  Vaping Use   Vaping status: Never Used  Substance Use Topics   Alcohol use: No    Comment: socially   Drug use: No     Allergies   Azithromycin  and Vancomycin    Review of Systems Review of Systems  Constitutional:  Negative for chills and fever.  HENT:  Positive for congestion, postnasal drip and rhinorrhea. Negative for ear pain and sore throat.   Eyes:  Negative for pain and  visual disturbance.  Respiratory:  Positive for cough and wheezing. Negative for shortness of breath.   Cardiovascular:  Negative for chest pain and palpitations.  Gastrointestinal:  Negative for abdominal pain, constipation, diarrhea, nausea and vomiting.  Genitourinary:  Positive for frequency and vaginal discharge. Negative for dysuria and hematuria.  Musculoskeletal:  Negative for arthralgias and back pain.  Skin:  Negative for color change and rash.  Neurological:  Negative for seizures and syncope.  All other systems reviewed and are negative.    Physical Exam Triage Vital Signs ED Triage Vitals  Encounter Vitals Group     BP 11/28/23 1922 (!) 145/95     Systolic BP Percentile --      Diastolic BP Percentile --      Pulse Rate 11/28/23 1922 100     Resp 11/28/23 1922 20     Temp 11/28/23 1922 98.3 F (36.8 C)     Temp Source 11/28/23 1922 Oral     SpO2 11/28/23 1922 95 %     Weight --      Height --      Head Circumference --      Peak Flow --      Pain Score 11/28/23 1920 0     Pain Loc --      Pain Education --      Exclude from Growth Chart --    No data found.  Updated Vital Signs BP (!) 145/95 (BP Location: Right Arm)   Pulse 100   Temp 98.3 F (36.8 C) (Oral)   Resp 20   LMP 10/06/2023   SpO2 95%   Visual Acuity Right Eye Distance:   Left Eye Distance:   Bilateral Distance:    Right Eye Near:   Left Eye Near:    Bilateral Near:     Physical Exam Vitals and nursing note reviewed.  Constitutional:      General: She is not in acute distress.    Appearance: She is well-developed. She is not ill-appearing or toxic-appearing.  HENT:     Head: Normocephalic and atraumatic.     Right Ear: Hearing, tympanic membrane, ear canal and external ear normal.     Left Ear: Hearing, tympanic membrane, ear canal and external ear normal.     Nose: Congestion and rhinorrhea present. Rhinorrhea is clear.     Right Sinus: No maxillary sinus tenderness or frontal  sinus tenderness.     Left Sinus: No maxillary sinus tenderness or frontal sinus tenderness.  Mouth/Throat:     Lips: Pink.     Mouth: Mucous membranes are moist.     Pharynx: Uvula midline. No oropharyngeal exudate or posterior oropharyngeal erythema.     Tonsils: No tonsillar exudate.  Eyes:     Conjunctiva/sclera: Conjunctivae normal.     Pupils: Pupils are equal, round, and reactive to light.  Cardiovascular:     Rate and Rhythm: Normal rate and regular rhythm.     Heart sounds: S1 normal and S2 normal. No murmur heard. Pulmonary:     Effort: Pulmonary effort is normal. No respiratory distress.     Breath sounds: Examination of the right-upper field reveals wheezing. Examination of the left-upper field reveals wheezing. Examination of the right-lower field reveals decreased breath sounds. Examination of the left-lower field reveals decreased breath sounds. Decreased breath sounds and wheezing present. No rhonchi or rales.  Abdominal:     General: Bowel sounds are normal.     Palpations: Abdomen is soft.     Tenderness: There is no abdominal tenderness. There is no right CVA tenderness, left CVA tenderness, guarding or rebound. Negative signs include Murphy's sign, Rovsing's sign and McBurney's sign.  Musculoskeletal:        General: No swelling.     Cervical back: Neck supple.  Lymphadenopathy:     Head:     Right side of head: No submental, submandibular, tonsillar, preauricular or posterior auricular adenopathy.     Left side of head: No submental, submandibular, tonsillar, preauricular or posterior auricular adenopathy.     Cervical: Cervical adenopathy present.     Right cervical: Superficial cervical adenopathy present.     Left cervical: Superficial cervical adenopathy present.  Skin:    General: Skin is warm and dry.     Capillary Refill: Capillary refill takes less than 2 seconds.     Findings: No rash.  Neurological:     Mental Status: She is alert and oriented to  person, place, and time.  Psychiatric:        Mood and Affect: Mood normal.      UC Treatments / Results  Labs (all labs ordered are listed, but only abnormal results are displayed) Labs Reviewed  POCT URINALYSIS DIP (MANUAL ENTRY) - Abnormal; Notable for the following components:      Result Value   Blood, UA large (*)    Protein Ur, POC =100 (*)    All other components within normal limits  URINE CULTURE  CERVICOVAGINAL ANCILLARY ONLY    EKG   Radiology DG Chest 2 View Result Date: 11/28/2023 CLINICAL DATA:  cough EXAM: CHEST - 2 VIEW COMPARISON:  Chest x-ray 06/10/2021 FINDINGS: The heart and mediastinal contours are within normal limits. No focal consolidation. No pulmonary edema. No pleural effusion. No pneumothorax. No acute osseous abnormality. Multilevel chronic vertebral body height loss of the thoracic levels. IMPRESSION: No active cardiopulmonary disease. Electronically Signed   By: Morgane  Naveau M.D.   On: 11/28/2023 20:45    Procedures Procedures (including critical care time)  Medications Ordered in UC Medications - No data to display  Initial Impression / Assessment and Plan / UC Course  I have reviewed the triage vital signs and the nursing notes.  Pertinent labs & imaging results that were available during my care of the patient were reviewed by me and considered in my medical decision making (see chart for details).     Plan of Care: Acute viral bronchitis, with cough: Chest x-ray appears negative for pneumonia based  on her symptoms she has acute viral bronchitis.  Added albuterol  inhaler, 2 puffs, every 4 hours as needed for wheezing.  Use inhaler with spacer.  Promethazine  DM, 5 mL, every 6 hours as needed for cough.  Plenty of fluids and rest. Urinary frequency: Urinalysis shows some blood in the urine on the dip UA, but the urine was clear and yellow.  Patient reports chronic microscopic hematuria secondary to some chronic kidney disease.  Will send  a culture to rule out infection but no antibiotics for now. Vaginitis: Patient self collected an STI swab we will update the patient once the STI swab results, if it is positive.  If is negative it will be available for review on the portal.  Follow-up here if symptoms do not improve, worsen or new symptoms occur.  Work excuse provided. Final Clinical Impressions(s) / UC Diagnoses   Final diagnoses:  Acute cough  Vaginal discharge  Urinary frequency  Acute viral bronchitis     Discharge Instructions      Chest x-ray appears negative for pneumonia but based on her symptoms she has an acute viral bronchitis.  Will update the patient if the radiology review differs.  Use albuterol  inhaler, 2 puffs, every 4 hours as needed for wheezing.  Use Promethazine  DM, 5 mL, every 6 hours as needed for cough.  Urinalysis shows some blood but patient reports she has chronic kidney problems and chronically has microscopic hematuria.  UA is negative for signs of infection but given the RBCs, we will send a culture to rule out infection.  Will update the patient if the culture is positive.  Regarding vaginitis, STI swab collected.  Will update the patient once the STI swab results, if it is positive.  If it is negative it will be available on the portal for review.  Follow-up if symptoms do not improve, worsen or new symptoms occur.     ED Prescriptions     Medication Sig Dispense Auth. Provider   albuterol  (VENTOLIN  HFA) 108 (90 Base) MCG/ACT inhaler Inhale 2 puffs into the lungs every 4 (four) hours as needed for wheezing or shortness of breath. 1 each Guss Legacy, FNP   Spacer/Aero-Holding Chambers (COMPACT SPACE CHAMBER) DEVI Use with the albuterol  inhaler 1 each Guss Legacy, FNP   promethazine -dextromethorphan (PROMETHAZINE -DM) 6.25-15 MG/5ML syrup Take 5 mLs by mouth 4 (four) times daily as needed for cough. Do not use and drive - May make drowsy. 118 mL Guss Legacy, FNP      PDMP not  reviewed this encounter.   Guss Legacy, FNP 11/28/23 2125

## 2023-11-28 NOTE — Discharge Instructions (Addendum)
 Chest x-ray appears negative for pneumonia but based on her symptoms she has an acute viral bronchitis.  Will update the patient if the radiology review differs.  Use albuterol  inhaler, 2 puffs, every 4 hours as needed for wheezing.  Use Promethazine  DM, 5 mL, every 6 hours as needed for cough.  Urinalysis shows some blood but patient reports she has chronic kidney problems and chronically has microscopic hematuria.  UA is negative for signs of infection but given the RBCs, we will send a culture to rule out infection.  Will update the patient if the culture is positive.  Regarding vaginitis, STI swab collected.  Will update the patient once the STI swab results, if it is positive.  If it is negative it will be available on the portal for review.  Follow-up if symptoms do not improve, worsen or new symptoms occur.

## 2023-11-28 NOTE — Progress Notes (Signed)
 Chest X-Ray: Negative.  Patient was given this update during her visit.

## 2023-11-29 LAB — CERVICOVAGINAL ANCILLARY ONLY
Bacterial Vaginitis (gardnerella): NEGATIVE
Candida Glabrata: NEGATIVE
Candida Vaginitis: NEGATIVE
Chlamydia: NEGATIVE
Comment: NEGATIVE
Comment: NEGATIVE
Comment: NEGATIVE
Comment: NEGATIVE
Comment: NEGATIVE
Comment: NORMAL
Neisseria Gonorrhea: NEGATIVE
Trichomonas: NEGATIVE

## 2023-11-29 LAB — URINE CULTURE: Culture: NO GROWTH

## 2023-11-29 NOTE — Progress Notes (Signed)
 Negative.  Patient updated

## 2024-04-29 ENCOUNTER — Ambulatory Visit (INDEPENDENT_AMBULATORY_CARE_PROVIDER_SITE_OTHER): Admitting: Radiology

## 2024-04-29 ENCOUNTER — Ambulatory Visit (HOSPITAL_BASED_OUTPATIENT_CLINIC_OR_DEPARTMENT_OTHER)
Admission: RE | Admit: 2024-04-29 | Discharge: 2024-04-29 | Disposition: A | Discharge status: Home / Self Care | Payer: Self-pay | Source: Ambulatory Visit | Attending: Family Medicine | Admitting: Family Medicine

## 2024-04-29 ENCOUNTER — Encounter (HOSPITAL_BASED_OUTPATIENT_CLINIC_OR_DEPARTMENT_OTHER): Payer: Self-pay

## 2024-04-29 VITALS — BP 141/92 | HR 80 | Temp 98.5°F | Resp 18

## 2024-04-29 DIAGNOSIS — R059 Cough, unspecified: Secondary | ICD-10-CM | POA: Diagnosis not present

## 2024-04-29 DIAGNOSIS — Z77120 Contact with and (suspected) exposure to mold (toxic): Secondary | ICD-10-CM | POA: Diagnosis not present

## 2024-04-29 DIAGNOSIS — R053 Chronic cough: Secondary | ICD-10-CM

## 2024-04-29 MED ORDER — PROMETHAZINE-DM 6.25-15 MG/5ML PO SYRP: 5.0000 mL | ORAL_SOLUTION | Freq: Four times a day (QID) | ORAL | 0 refills | Status: DC | PRN

## 2024-04-29 MED ORDER — PREDNISONE 20 MG PO TABS: 20.0000 mg | ORAL_TABLET | Freq: Every day | ORAL | 0 refills | Status: AC

## 2024-04-29 NOTE — Discharge Instructions (Addendum)
 Chronic cough and exposure to mold: Chest x-ray is negative.  Prednisone  20 mg daily for 5 days.  Promethazine  DM, 5 mL, every 6 hours if needed for cough.  Patient will follow-up with primary care if needed.  Follow-up here as needed

## 2024-04-29 NOTE — ED Triage Notes (Signed)
 Pt reports she has mold in her home in process of removing. Cough persistent for 6 weeks. Throat very itchy but no sore throat. Some congestion (not nose) Cough is to the point of chocking. Worse at night.

## 2024-04-29 NOTE — ED Provider Notes (Signed)
 [name] [name]    CSN: [id number] Arrival date & time: 04/29/24  1802      History   Chief Complaint Chief Complaint  Patient presents with   Cough    HPI [name] [name] is a 28 y.o. female.   28 year old female here with reports that she developed a cough and congestion on approximately 03/10/2024 or earlier.  Sometimes she would cough up a little bit of white or clear sputum but mostly it has been a dry irritant cough.  She has had an irritated throat from all the coughing but not really a sore throat.  The cough is worse at night.  If she gets coughing a lot she kind of feels like she is choking or might even gag or vomit if she coughs really hard.  She denies fever, chills, nausea, vomiting, constipation, diarrhea.  She is not aware of any wheezing.     Cough Associated symptoms: no chest pain, no chills, no ear pain, no fever, no rash, no shortness of breath and no sore throat     Past Medical History:  Diagnosis Date   Anemia    Fibroadenoma of left breast    needs follow up 1/20   Fibroid    Headache    History of chicken pox    Medical history non-contributory    Streptococcus B carrier or suspected carrier    Thyroid  disease     Patient Active Problem List   Diagnosis Date Noted   Abnormal findings on diagnostic imaging of other parts of digestive tract 07/23/2023   Gastroesophageal reflux disease 07/23/2023   Tietze's disease 07/23/2023   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 Authorizing Provider  predniSONE  (DELTASONE ) 20 MG tablet Take 1 tablet (20 mg total) by mouth daily with breakfast for 5 days. 04/29/24 05/04/24 Yes Ival Domino, FNP  promethazine -dextromethorphan (PROMETHAZINE -DM) 6.25-15 MG/5ML syrup Take 5 mLs by mouth 4 (four) times daily as needed for cough. Do not use and drive - May make drowsy. 04/29/24  Yes Ival Domino, FNP  albuterol  (VENTOLIN  HFA) 108 (90 Base) MCG/ACT inhaler Inhale 2 puffs into the lungs every 4 (four) hours as needed for wheezing or shortness of breath. 11/28/23   Ival Domino, FNP  ibuprofen  (ADVIL ) 200 MG tablet Take 400 mg by mouth every 6 (six) hours as needed.    [provider]  levothyroxine  (SYNTHROID ) 88 MCG tablet Take 1 tablet (88 mcg total) by mouth daily. 03/01/22   Job Lukes, PA  Spacer/Aero-Holding Chambers (COMPACT SPACE CHAMBER) DEVI Use with the albuterol  inhaler 11/28/23   Ival Domino, FNP  Family History Family History  Problem Relation Age of Onset   Healthy Mother    Mental retardation Brother    COPD Paternal Grandmother    Bladder Cancer Paternal Grandmother    Diabetes Paternal Great-grandmother     Social History Social History   Tobacco Use   Smoking status: Every Day    Current packs/day: 0.00    Types: Cigarettes    Last attempt to quit: 10/25/2010    Years since quitting: 13.5   Smokeless tobacco: Never   Tobacco comments:    Smoking 5 cigarettes a day  Vaping Use   Vaping status: Never Used  Substance Use Topics   Alcohol use: No    Comment: socially   Drug use: No     Allergies   Azithromycin  and Vancomycin    Review of Systems Review of Systems  Constitutional:  Negative for chills and fever.  HENT:  Negative for ear pain and sore throat.   Eyes:  Negative for pain and visual disturbance.  Respiratory:  Positive for  cough. Negative for shortness of breath.   Cardiovascular:  Negative for chest pain and palpitations.  Gastrointestinal:  Negative for abdominal pain, constipation, diarrhea, nausea and vomiting.  Genitourinary:  Negative for dysuria and hematuria.  Musculoskeletal:  Negative for arthralgias and back pain.  Skin:  Negative for color change and rash.  Neurological:  Negative for seizures and syncope.  All other systems reviewed and are negative.    Physical Exam Triage Vital Signs ED Triage Vitals  Encounter Vitals Group     BP 04/29/24 1842 (!) 141/92     Girls Systolic BP Percentile --      Girls Diastolic BP Percentile --      Boys Systolic BP Percentile --      Boys Diastolic BP Percentile --      Pulse Rate 04/29/24 1842 80     Resp 04/29/24 1842 18     Temp 04/29/24 1842 98.5 F (36.9 C)     Temp Source 04/29/24 1842 Oral     SpO2 04/29/24 1842 98 %     Weight --      Height --      Head Circumference --      Peak Flow --      Pain Score 04/29/24 1841 0     Pain Loc --      Pain Education --      Exclude from Growth Chart --    No data found.  Updated Vital Signs BP (!) 141/92 (BP Location: Right Arm)   Pulse 80   Temp 98.5 F (36.9 C) (Oral)   Resp 18   LMP 04/25/2024   SpO2 98%   Visual Acuity Right Eye Distance:   Left Eye Distance:   Bilateral Distance:    Right Eye Near:   Left Eye Near:    Bilateral Near:     Physical Exam Vitals and nursing note reviewed.  Constitutional:      General: She is not in acute distress.    Appearance: She is well-developed. She is not ill-appearing, toxic-appearing or diaphoretic.  HENT:     Head: Normocephalic and atraumatic.     Right Ear: Hearing, tympanic membrane, ear canal and external ear normal.     Left Ear: Hearing, tympanic membrane, ear canal and external ear normal.     Nose: No congestion or rhinorrhea.     Right Sinus: No maxillary sinus tenderness or frontal sinus tenderness.  Left Sinus: No  maxillary sinus tenderness or frontal sinus tenderness.     Mouth/Throat:     Lips: Pink.     Mouth: Mucous membranes are moist.     Pharynx: Uvula midline. No oropharyngeal exudate or posterior oropharyngeal erythema.     Tonsils: No tonsillar exudate.  Eyes:     Conjunctiva/sclera: Conjunctivae normal.     Pupils: Pupils are equal, round, and reactive to light.  Cardiovascular:     Rate and Rhythm: Normal rate and regular rhythm.     Heart sounds: S1 normal and S2 normal. No murmur heard. Pulmonary:     Effort: Pulmonary effort is normal. No respiratory distress.     Breath sounds: Examination of the right-upper field reveals decreased breath sounds. Examination of the right-middle field reveals decreased breath sounds. Examination of the right-lower field reveals decreased breath sounds. Decreased breath sounds (Breath sounds audible on the right but decreased significantly from the left breath sounds.) present. No wheezing, rhonchi or rales.  Abdominal:     General: Bowel sounds are normal.     Palpations: Abdomen is soft.     Tenderness: There is no abdominal tenderness.  Musculoskeletal:        General: No swelling.     Cervical back: Neck supple.  Lymphadenopathy:     Head:     Right side of head: No submental, submandibular, tonsillar, preauricular or posterior auricular adenopathy.     Left side of head: No submental, submandibular, tonsillar, preauricular or posterior auricular adenopathy.     Cervical: No cervical adenopathy.     Right cervical: No superficial cervical adenopathy.    Left cervical: No superficial cervical adenopathy.  Skin:    General: Skin is warm and dry.     Capillary Refill: Capillary refill takes less than 2 seconds.     Findings: No rash.  Neurological:     Mental Status: She is alert and oriented to person, place, and time.  Psychiatric:        Mood and Affect: Mood normal.      UC Treatments / Results  Labs (all labs ordered are listed,  but only abnormal results are displayed) Labs Reviewed - No data to display  EKG   Radiology No results found.   Procedures Procedures (including critical care time)  Medications Ordered in UC Medications - No data to display  Initial Impression / Assessment and Plan / UC Course  I have reviewed the triage vital signs and the nursing notes.  Pertinent labs & imaging results that were available during my care of the patient were reviewed by me and considered in my medical decision making (see chart for details).  Plan of Care: Chronic cough and exposure to mold: Chest x-ray is negative.  Prednisone  20 mg daily for 5 days.  Promethazine  DM, 5 mL, every 6 hours if needed for cough.  Patient will follow-up with primary care if needed.  Follow-up here as needed.  I reviewed the plan of care with the patient and/or the patient's guardian.  The patient and/or guardian had time to ask questions and acknowledged that the questions were answered.  I provided instruction on symptoms or reasons to return here or to go to an ER, if symptoms/condition did not improve, worsened or if new symptoms occurred.  Final Clinical Impressions(s) / UC Diagnoses   Final diagnoses:  Chronic cough  Exposure to mold     Discharge Instructions      Chronic cough and  exposure to mold: Chest x-ray is negative.  Prednisone  20 mg daily for 5 days.  Promethazine  DM, 5 mL, every 6 hours if needed for cough.  Patient will follow-up with primary care if needed.  Follow-up here as needed     ED Prescriptions     Medication Sig Dispense Auth. Provider   promethazine -dextromethorphan (PROMETHAZINE -DM) 6.25-15 MG/5ML syrup Take 5 mLs by mouth 4 (four) times daily as needed for cough. Do not use and drive - May make drowsy. 118 mL Ival Domino, FNP   predniSONE  (DELTASONE ) 20 MG tablet Take 1 tablet (20 mg total) by mouth daily with breakfast for 5 days. 5 tablet Jahred Tatar, FNP      PDMP not reviewed  this encounter.   Ival Domino, FNP 05/04/24 1009

## 2024-07-24 LAB — LAB REPORT - SCANNED
A1c: 5.6
Albumin, Urine POC: 160.8
EGFR: 105
Free T4: 1.07 ng/dL
Microalb Creat Ratio: 370
TSH: 4.12

## 2024-09-01 ENCOUNTER — Ambulatory Visit: Admitting: Physician Assistant

## 2024-09-01 ENCOUNTER — Encounter: Payer: Self-pay | Admitting: Physician Assistant

## 2024-09-01 ENCOUNTER — Other Ambulatory Visit (HOSPITAL_COMMUNITY)
Admission: RE | Admit: 2024-09-01 | Discharge: 2024-09-01 | Disposition: A | Discharge status: Home / Self Care | Source: Ambulatory Visit | Attending: Physician Assistant | Admitting: Physician Assistant

## 2024-09-01 VITALS — BP 124/80 | HR 76 | Temp 98.4°F | Ht 68.0 in | Wt 229.0 lb

## 2024-09-01 DIAGNOSIS — N760 Acute vaginitis: Secondary | ICD-10-CM

## 2024-09-01 DIAGNOSIS — R2231 Localized swelling, mass and lump, right upper limb: Secondary | ICD-10-CM | POA: Diagnosis not present

## 2024-09-01 DIAGNOSIS — Z113 Encounter for screening for infections with a predominantly sexual mode of transmission: Secondary | ICD-10-CM | POA: Diagnosis not present

## 2024-09-01 DIAGNOSIS — Z809 Family history of malignant neoplasm, unspecified: Secondary | ICD-10-CM | POA: Diagnosis not present

## 2024-09-01 NOTE — Progress Notes (Signed)
 "  History of Present Illness:   Chief Complaint  Patient presents with   STD testing    Pt has a new sexual partner and wants STD testing done.    Discussed the use of AI scribe software for clinical note transcription with the patient, who gave verbal consent to proceed.  History of Present Illness   [name] [name] is a 29 year old female who presents for a full panel STD screening.  She has had intermittent lower abdominal and lower back cramps for three weeks, similar to her usual menstrual and ovulation cramps but now occurring daily. She denies pelvic pain, vaginal discharge, constipation, or pregnancy concern.  She has recurrent BV and yeast infections. Her prior OB prescribed several months of metronidazole  gel for suppression, but she no longer has access since the OB retired. The gel causes a thick white discharge without typical yeast symptoms. She was treated for both yeast and BV in October, and some yeast infections have not responded to Diflucan .  She has a mass under her right arm that she feels has enlarged and is sometimes tender. An ultrasound in 2023 reportedly showed a cyst, but she is now concerned it has grown.  Her periods are now more regular and shorter after weight loss, with 24-day cycles and 4 to 5 day, non-heavy menses without large clots. She is not interested in birth control.  She takes Advil  and a multivitamin. She previously had prediabetes that resolved with weight loss but still has high cholesterol, which she attributes to her diet.        Past Medical History:  Diagnosis Date   Anemia    Fibroadenoma of left breast    needs follow up 1/20   Fibroid    Headache    History of chicken pox    Medical history non-contributory    Streptococcus B carrier or suspected carrier    Thyroid  disease      Social History[1]  Past Surgical History:  Procedure Laterality Date   NO PAST SURGERIES      Family History  Problem Relation Age of Onset    Ovarian cancer Mother        stage 4   Mental retardation Brother    Breast cancer Maternal Grandmother    COPD Paternal Grandmother    Bladder Cancer Paternal Grandmother    Diabetes Paternal Great-grandmother     Allergies[2]  Current Medications:  Current Medications[3]   Review of Systems:   Negative unless otherwise specified per HPI.  Vitals:   Vitals:   09/01/24 1108  BP: 124/80  Pulse: 76  Temp: 98.4 F (36.9 C)  TempSrc: Temporal  Weight: 229 lb (103.9 kg)  Height: 5' 8 (1.727 m)     Body mass index is 34.82 kg/m.  Physical Exam:   Physical Exam Vitals and nursing note reviewed.  Constitutional:      General: She is not in acute distress.    Appearance: She is well-developed. She is not ill-appearing or toxic-appearing.  Cardiovascular:     Rate and Rhythm: Normal rate and regular rhythm.     Pulses: Normal pulses.     Heart sounds: Normal heart sounds, S1 normal and S2 normal.  Pulmonary:     Effort: Pulmonary effort is normal.     Breath sounds: Normal breath sounds.  Lymphadenopathy:     Upper Body:     Right upper body: Axillary adenopathy present.  Skin:    General: Skin is  warm and dry.  Neurological:     Mental Status: She is alert.     GCS: GCS eye subscore is 4. GCS verbal subscore is 5. GCS motor subscore is 6.  Psychiatric:        Speech: Speech normal.        Behavior: Behavior normal. Behavior is cooperative.     Assessment and Plan:   Assessment and Plan    Screening for sexually transmitted infections Requested full STI panel due to recurrent BV and yeast infections. Prefers vaginal swab for gonorrhea, chlamydia, trichomonas, BV, and yeast. Blood work for HIV and syphilis planned.  - Performed vaginal swab for gonorrhea, chlamydia, trichomonas, BV, and yeast infections. - Ordered blood work for HIV and syphilis.  Axillary mass, right  Right axillary mass identified as 9 mm sebaceous inclusion cyst, now larger and  tender. No infection or drainage. Further evaluation needed due to changes. - Ordered ultrasound of right axilla at breast center. - Will consider referral to general surgery for evaluation and potential removal if imaging indicates changes.  Family history of cancer  Family history of ovarian, breast, and bladder cancer. Discussed genetic testing for hereditary cancer syndromes to assess risk and guide screening. - Referred to genetic counseling for hereditary cancer syndrome assessment. - Instructed to collect family history information for genetic testing.     Recurrent vaginitis  -If bacterial vaginosis test is positive, recommend starting suppression treatment after acute treatment   Lucie Buttner, PA-C    [1]  Social History Tobacco Use   Smoking status: Former    Types: Cigarettes    Start date: 05/06/2024    Quit date: 10/25/2010    Years since quitting: 13.8   Smokeless tobacco: Never   Tobacco comments:    Smoking 5 cigarettes a day  Vaping Use   Vaping status: Never Used  Substance Use Topics   Alcohol use: No    Comment: socially   Drug use: No  [2]  Allergies Allergen Reactions   Azithromycin      Other reaction(s): Unknown   Vancomycin  Itching  [3]  Current Outpatient Medications:    ibuprofen  (ADVIL ) 200 MG tablet, Take 400 mg by mouth every 6 (six) hours as needed., Disp: , Rfl:    Multiple Vitamin (MULTIVITAMIN) tablet, Take 1 tablet by mouth daily., Disp: , Rfl:   "

## 2024-09-02 LAB — CERVICOVAGINAL ANCILLARY ONLY
Bacterial Vaginitis (gardnerella): NEGATIVE
Candida Glabrata: NEGATIVE
Candida Vaginitis: NEGATIVE
Chlamydia: NEGATIVE
Comment: NEGATIVE
Comment: NEGATIVE
Comment: NEGATIVE
Comment: NEGATIVE
Comment: NEGATIVE
Comment: NORMAL
Neisseria Gonorrhea: NEGATIVE
Trichomonas: NEGATIVE

## 2024-09-02 LAB — HIV ANTIBODY (ROUTINE TESTING W REFLEX)
HIV 1&2 Ab, 4th Generation: NONREACTIVE
HIV FINAL INTERPRETATION: NEGATIVE

## 2024-09-02 LAB — SYPHILIS: RPR W/REFLEX TO RPR TITER AND TREPONEMAL ANTIBODIES, TRADITIONAL SCREENING AND DIAGNOSIS ALGORITHM: RPR Ser Ql: NONREACTIVE

## 2024-09-02 LAB — HEPATITIS C ANTIBODY: Hepatitis C Ab: NONREACTIVE

## 2024-09-03 ENCOUNTER — Ambulatory Visit: Payer: Self-pay | Admitting: Physician Assistant

## 2024-10-09 ENCOUNTER — Inpatient Hospital Stay
